# Patient Record
Sex: Female | Born: 1935 | Race: Black or African American | Hispanic: No | Marital: Married | Smoking: Never smoker
Health system: Southern US, Community
[De-identification: ages and names within clinical notes are randomized; demographics above are authoritative.]

## PROBLEM LIST (undated history)

## (undated) DIAGNOSIS — M6282 Rhabdomyolysis: Secondary | ICD-10-CM

## (undated) DIAGNOSIS — M1990 Unspecified osteoarthritis, unspecified site: Secondary | ICD-10-CM

## (undated) DIAGNOSIS — I639 Cerebral infarction, unspecified: Secondary | ICD-10-CM

## (undated) DIAGNOSIS — E538 Deficiency of other specified B group vitamins: Secondary | ICD-10-CM

## (undated) DIAGNOSIS — E785 Hyperlipidemia, unspecified: Secondary | ICD-10-CM

## (undated) DIAGNOSIS — E119 Type 2 diabetes mellitus without complications: Secondary | ICD-10-CM

## (undated) DIAGNOSIS — I10 Essential (primary) hypertension: Secondary | ICD-10-CM

## (undated) DIAGNOSIS — E041 Nontoxic single thyroid nodule: Secondary | ICD-10-CM

## (undated) HISTORY — DX: Morbid (severe) obesity due to excess calories: E66.01

## (undated) HISTORY — DX: Type 2 diabetes mellitus without complications: E11.9

## (undated) HISTORY — DX: Hyperlipidemia, unspecified: E78.5

## (undated) HISTORY — DX: Essential (primary) hypertension: I10

## (undated) HISTORY — DX: Unspecified osteoarthritis, unspecified site: M19.90

---

## 2000-07-17 ENCOUNTER — Encounter: Admission: RE | Admit: 2000-07-17 | Discharge: 2000-10-15 | Payer: Self-pay | Admitting: Endocrinology

## 2003-09-15 ENCOUNTER — Emergency Department (HOSPITAL_COMMUNITY): Admission: EM | Admit: 2003-09-15 | Discharge: 2003-09-15 | Payer: Self-pay | Admitting: Emergency Medicine

## 2004-10-17 ENCOUNTER — Ambulatory Visit: Payer: Self-pay | Admitting: Endocrinology

## 2004-10-23 ENCOUNTER — Ambulatory Visit: Payer: Self-pay | Admitting: Endocrinology

## 2004-12-05 ENCOUNTER — Ambulatory Visit: Payer: Self-pay | Admitting: Endocrinology

## 2005-03-06 ENCOUNTER — Ambulatory Visit: Payer: Self-pay | Admitting: Endocrinology

## 2005-06-05 ENCOUNTER — Ambulatory Visit: Payer: Self-pay | Admitting: Endocrinology

## 2005-07-10 ENCOUNTER — Ambulatory Visit: Payer: Self-pay | Admitting: Endocrinology

## 2006-01-03 ENCOUNTER — Ambulatory Visit: Payer: Self-pay | Admitting: Endocrinology

## 2006-07-02 ENCOUNTER — Ambulatory Visit: Payer: Self-pay | Admitting: Endocrinology

## 2006-09-25 ENCOUNTER — Ambulatory Visit: Payer: Self-pay | Admitting: Endocrinology

## 2006-09-25 LAB — CONVERTED CEMR LAB
Bilirubin, Direct: 0.1 mg/dL (ref 0.0–0.3)
HDL: 34.4 mg/dL — ABNORMAL LOW (ref >39.0)
Hgb A1c MFr Bld: 7 % — ABNORMAL HIGH (ref 4.6–6.0)
LDL Cholesterol: 120 mg/dL — ABNORMAL HIGH (ref 0–99)
Triglycerides: 123 mg/dL (ref 0–149)
VLDL: 25 mg/dL (ref 0–40)

## 2006-12-25 ENCOUNTER — Ambulatory Visit: Payer: Self-pay | Admitting: Endocrinology

## 2006-12-25 LAB — CONVERTED CEMR LAB: Hgb A1c MFr Bld: 7 % — ABNORMAL HIGH (ref 4.6–6.0)

## 2007-05-09 ENCOUNTER — Encounter: Payer: Self-pay | Admitting: *Deleted

## 2007-05-09 DIAGNOSIS — M1990 Unspecified osteoarthritis, unspecified site: Secondary | ICD-10-CM

## 2007-05-09 DIAGNOSIS — I10 Essential (primary) hypertension: Secondary | ICD-10-CM

## 2007-05-09 DIAGNOSIS — E785 Hyperlipidemia, unspecified: Secondary | ICD-10-CM

## 2007-05-09 DIAGNOSIS — L301 Dyshidrosis [pompholyx]: Secondary | ICD-10-CM | POA: Insufficient documentation

## 2007-05-09 HISTORY — DX: Hyperlipidemia, unspecified: E78.5

## 2007-05-09 HISTORY — DX: Unspecified osteoarthritis, unspecified site: M19.90

## 2007-05-09 HISTORY — DX: Essential (primary) hypertension: I10

## 2007-06-24 ENCOUNTER — Ambulatory Visit: Payer: Self-pay | Admitting: Endocrinology

## 2007-06-24 DIAGNOSIS — L299 Pruritus, unspecified: Secondary | ICD-10-CM | POA: Insufficient documentation

## 2007-06-24 LAB — CONVERTED CEMR LAB
CO2: 29 meq/L (ref 19–32)
Cholesterol: 249 mg/dL (ref 0–200)
Creatinine, Ser: 1 mg/dL (ref 0.4–1.2)
HDL: 33.1 mg/dL — ABNORMAL LOW (ref >39.0)
Potassium: 5 meq/L (ref 3.5–5.1)
Sodium: 139 meq/L (ref 135–145)

## 2007-12-03 ENCOUNTER — Encounter: Payer: Self-pay | Admitting: Endocrinology

## 2007-12-23 ENCOUNTER — Ambulatory Visit: Payer: Self-pay | Admitting: Endocrinology

## 2008-06-22 ENCOUNTER — Ambulatory Visit: Payer: Self-pay | Admitting: Endocrinology

## 2008-12-03 ENCOUNTER — Encounter: Payer: Self-pay | Admitting: Endocrinology

## 2008-12-21 ENCOUNTER — Ambulatory Visit: Payer: Self-pay | Admitting: Endocrinology

## 2008-12-23 LAB — CONVERTED CEMR LAB
ALT: 20 units/L (ref 0–35)
AST: 26 units/L (ref 0–37)
Bilirubin, Direct: 0.3 mg/dL (ref 0.0–0.3)
Hgb A1c MFr Bld: 7 % — ABNORMAL HIGH (ref 4.6–6.5)
Total Bilirubin: 0.9 mg/dL (ref 0.3–1.2)
Total CHOL/HDL Ratio: 3

## 2009-01-26 ENCOUNTER — Telehealth: Payer: Self-pay | Admitting: Endocrinology

## 2009-02-23 ENCOUNTER — Ambulatory Visit: Payer: Self-pay | Admitting: Endocrinology

## 2009-04-15 ENCOUNTER — Telehealth: Payer: Self-pay | Admitting: Endocrinology

## 2009-04-19 ENCOUNTER — Ambulatory Visit: Payer: Self-pay | Admitting: Endocrinology

## 2009-04-19 LAB — CONVERTED CEMR LAB
Creatinine,U: 100.4 mg/dL
Hgb A1c MFr Bld: 6.9 % — ABNORMAL HIGH (ref 4.6–6.5)

## 2009-06-24 ENCOUNTER — Ambulatory Visit: Payer: Self-pay | Admitting: Endocrinology

## 2009-06-24 DIAGNOSIS — E119 Type 2 diabetes mellitus without complications: Secondary | ICD-10-CM

## 2009-06-24 DIAGNOSIS — Z9119 Patient's noncompliance with other medical treatment and regimen: Secondary | ICD-10-CM

## 2009-06-24 HISTORY — DX: Type 2 diabetes mellitus without complications: E11.9

## 2009-12-21 ENCOUNTER — Encounter: Payer: Self-pay | Admitting: Endocrinology

## 2009-12-29 ENCOUNTER — Ambulatory Visit: Payer: Self-pay | Admitting: Endocrinology

## 2009-12-29 DIAGNOSIS — M25569 Pain in unspecified knee: Secondary | ICD-10-CM

## 2009-12-30 LAB — CONVERTED CEMR LAB
ALT: 18 units/L (ref 0–35)
Alkaline Phosphatase: 74 units/L (ref 39–117)
Basophils Relative: 0.5 % (ref 0.0–3.0)
Bilirubin, Direct: 0.1 mg/dL (ref 0.0–0.3)
Calcium: 9.5 mg/dL (ref 8.4–10.5)
Chloride: 104 meq/L (ref 96–112)
Creatinine, Ser: 0.9 mg/dL (ref 0.4–1.2)
Eosinophils Relative: 2.2 % (ref 0.0–5.0)
LDL Cholesterol: 87 mg/dL (ref 0–99)
Lymphocytes Relative: 27.4 % (ref 12.0–46.0)
MCV: 93.6 fL (ref 78.0–100.0)
Monocytes Relative: 13.9 % — ABNORMAL HIGH (ref 3.0–12.0)
Neutrophils Relative %: 56 % (ref 43.0–77.0)
Nitrite: NEGATIVE
RBC: 4.23 M/uL (ref 3.87–5.11)
Total CHOL/HDL Ratio: 3
Total Protein, Urine: NEGATIVE mg/dL
Total Protein: 7.4 g/dL (ref 6.0–8.3)
Triglycerides: 62 mg/dL (ref 0.0–149.0)
Uric Acid, Serum: 6.8 mg/dL (ref 2.4–7.0)
WBC: 6.4 10*3/uL (ref 4.5–10.5)
pH: 6 (ref 5.0–8.0)

## 2010-09-10 LAB — CONVERTED CEMR LAB
Basophils Absolute: 0 10*3/uL (ref 0.0–0.1)
Bilirubin, Direct: 0.1 mg/dL (ref 0.0–0.3)
Calcium: 9.3 mg/dL (ref 8.4–10.5)
Creatinine, Ser: 0.9 mg/dL (ref 0.4–1.2)
Creatinine,U: 103.7 mg/dL
Crystals: NEGATIVE
Direct LDL: 180.9 mg/dL
GFR calc Af Amer: 77 mL/min
GFR calc non Af Amer: 64 mL/min
HCT: 39 % (ref 36.0–46.0)
HDL: 34.8 mg/dL — ABNORMAL LOW (ref >39.0)
LDL Cholesterol: 199 mg/dL — ABNORMAL HIGH (ref 0–99)
Lymphocytes Relative: 33.4 % (ref 12.0–46.0)
MCHC: 34.9 g/dL (ref 30.0–36.0)
Microalb Creat Ratio: 7.7 mg/g (ref 0.0–30.0)
Microalb, Ur: 0.8 mg/dL (ref 0.0–1.9)
Monocytes Absolute: 1.1 10*3/uL — ABNORMAL HIGH (ref 0.1–1.0)
Monocytes Relative: 18.3 % — ABNORMAL HIGH (ref 3.0–12.0)
Nitrite: NEGATIVE
Platelets: 209 10*3/uL (ref 150–400)
RDW: 13.6 % (ref 11.5–14.6)
Sodium: 141 meq/L (ref 135–145)
TSH: 1.47 microintl units/mL (ref 0.35–5.50)
Total Bilirubin: 0.9 mg/dL (ref 0.3–1.2)
Total CHOL/HDL Ratio: 7.4
Total Protein, Urine: NEGATIVE mg/dL
Triglycerides: 125 mg/dL (ref 0–149)
Urobilinogen, UA: 0.2 (ref 0.0–1.0)

## 2010-09-14 NOTE — Assessment & Plan Note (Signed)
Summary: f/u appt/#/cd   Vital Signs:  Patient profile:   77 year old female Height:      65 inches (165.10 cm) Weight:      218 pounds (99.09 kg) BMI:     36.41 O2 Sat:      94 % on Room air Temp:     98.3 degrees F (36.83 degrees C) oral Pulse rate:   105 / minute BP sitting:   112 / 70  (left arm) Cuff size:   large  Vitals Entered By: Josph Macho RMA (Dec 29, 2009 10:28 AM)  O2 Flow:  Room air CC: Follow-up visit/ CF Is Patient Diabetic? Yes   CC:  Follow-up visit/ CF.  History of Present Illness: the status of at least 3 ongoing medical problems is addressed today: pt says she does not want the azor.  she wants to take the lotrel.   pt states she feels well in general, except for her chronic right knee pain. pt says she does not check cbg's.  she tolerates the actos well.  Current Medications (verified): 1)  Celebrex 200 Mg  Caps (Celecoxib) .... Take 1 By Mouth Qd 2)  Glucophage 1000 Mg  Tabs (Metformin Hcl) .... Take 2 By Mouth Once Daily 3)  Actos 45 Mg  Tabs (Pioglitazone Hcl) .... Take 1 By Mouth Qd 4)  Lotrel 5-20 Mg  Caps (Amlodipine Besy-Benazepril Hcl) .... Take 2 By Mouth Qd 5)  Hydrochlorothiazide 25 Mg  Tabs (Hydrochlorothiazide) .... Take 1 By Mouth Qd 6)  Lipitor 80 Mg Tabs (Atorvastatin Calcium) .... Take 1 By Mouth Once Daily 7)  Triamcinolone Acetonide 0.025 %  Crea (Triamcinolone Acetonide) .... Apply To Affected Area Prn 8)  Coreg 3.125 Mg  Tabs (Carvedilol) .... Twice A Day  Allergies (verified): 1)  ! Lipitor  Past History:  Past Medical History: Last updated: 05/09/2007 Diabetes mellitus, type I Hyperlipidemia Hypertension Osteoarthritis Morbid Obesity Mammogram (Pt Refused to have) GYN Exam ( Pt Refused to have)  Review of Systems  The patient denies prolonged cough and peripheral edema.    Physical Exam  General:  morbidly obese.   Msk:  gait is normal and steady Extremities:  right knee is normal to my eaxm Additional  Exam:  Hemoglobin A1C       [H]  6.7 %    Impression & Recommendations:  Problem # 1:  DIABETES MELLITUS, TYPE II (ICD-250.00) well-controlled  Problem # 2:  HYPERTENSION (ICD-401.9) well-controlled  Problem # 3:  KNEE PAIN, RIGHT (ICD-719.46) uncertain etiology  Other Orders: TLB-Lipid Panel (80061-LIPID) TLB-BMP (Basic Metabolic Panel-BMET) (80048-METABOL) TLB-CBC Platelet - w/Differential (85025-CBCD) TLB-Hepatic/Liver Function Pnl (80076-HEPATIC) TLB-TSH (Thyroid Stimulating Hormone) (84443-TSH) TLB-A1C / Hgb A1C (Glycohemoglobin) (83036-A1C) TLB-Microalbumin/Creat Ratio, Urine (82043-MALB) TLB-Udip w/ Micro (81001-URINE) TLB-Uric Acid, Blood (84550-URIC) TLB-Sedimentation Rate (ESR) (85652-ESR) Est. Patient Level IV ([address])  Patient Instructions: 1)  please schedule a physical in 1 month.  you can do the blood tests for it today. 2)  pending the test results, please continue the same medications for now. 3)  (update: i left message on phone-tree:  rx as we discussed) Prescriptions: LOTREL 5-20 MG  CAPS (AMLODIPINE BESY-BENAZEPRIL HCL) take 2 by mouth qd  #90 x 3   Entered and Authorized by:   Minus Breeding MD   Signed by:   Minus Breeding MD on 12/29/2009   Method used:   [facility]        [facility] [address] [phone]* (retail)  [address]       [place], [place]  [address]       Ph: [phone] or [phone]       Fax: [phone]   RxID:   [id number] TRIAMCINOLONE ACETONIDE 0.025 %  CREA (TRIAMCINOLONE ACETONIDE) APPLY TO AFFECTED AREA PRN  #80 Gram x 1   Entered and Authorized by:   Minus Breeding MD   Signed by:   Minus Breeding MD on 12/29/2009   Method used:   [facility]        [facility] [address] [phone]* (retail)       [address]       [place], [place]  [address]       Ph: [phone] or [phone]       Fax: [phone]   RxID:   [id number] COREG 3.125 MG  TABS  (CARVEDILOL) twice a day  #180 x 3   Entered and Authorized by:   Minus Breeding MD   Signed by:   Minus Breeding MD on 12/29/2009   Method used:   [facility]        [facility] [address] [phone]* (retail)       [address]       [place], [place]  [address]       Ph: [phone] or [phone]       Fax: [phone]   RxID:   [id number] CELEBREX 200 MG  CAPS (CELECOXIB) take 1 by mouth qd  #90 x 3   Entered and Authorized by:   Minus Breeding MD   Signed by:   Minus Breeding MD on 12/29/2009   Method used:   [facility] to        [facility] [address] [phone]* (retail)       [address]       [place], [place]  [address]       Ph: [phone] or [phone]       Fax: [phone]   RxID:   [id number] LIPITOR 80 MG TABS (ATORVASTATIN CALCIUM) take 1 by mouth once daily  #90 x 3   Entered and Authorized by:   Minus Breeding MD   Signed by:   Minus Breeding MD on 12/29/2009   Method used:   [facility] to        [facility] [address] [phone]* (retail)       [address]       [place], [place]  [address]       Ph: [phone] or [phone]       Fax: [phone]   RxID:   [id number] HYDROCHLOROTHIAZIDE 25 MG  TABS (HYDROCHLOROTHIAZIDE) take 1 by mouth qd  #90 x 3   Entered and Authorized by:   Minus Breeding MD   Signed by:   Minus Breeding MD on 12/29/2009   Method used:   [facility] to        [facility] [address] [phone]* (retail)       [address]       [place], [place]  [address]       Ph: [phone] or [phone]  Fax: [phone]   RxID:   [id number] ACTOS 45 MG  TABS (PIOGLITAZONE HCL) take 1 by mouth qd  #90 x 3   Entered and Authorized by:   Minus Breeding MD   Signed by:   Minus Breeding MD on 12/29/2009   Method used:   [facility]        [facility] [address] [phone]* (retail)       [address]        [place], [place]  [address]       Ph: [phone] or [phone]       Fax: [phone]   RxID:   [id number] GLUCOPHAGE 1000 MG  TABS (METFORMIN HCL) take 2 by mouth once daily  #180 x 3   Entered and Authorized by:   Minus Breeding MD   Signed by:   Minus Breeding MD on 12/29/2009   Method used:   [facility]ronically to        [facility] [address] [phone]* (retail)       [address]       [place], [place]  [address]       Ph: [phone] or [phone]       Fax: [phone]   RxID:   [id number]

## 2010-09-14 NOTE — Letter (Signed)
Summary: [facility]  [facility]   Imported By: Sherian Rein 12/27/2009 08:07:53  _____________________________________________________________________  External Attachment:    Type:   Image     Comment:   External Document

## 2010-12-21 ENCOUNTER — Encounter: Payer: Self-pay | Admitting: Endocrinology

## 2010-12-21 ENCOUNTER — Other Ambulatory Visit (INDEPENDENT_AMBULATORY_CARE_PROVIDER_SITE_OTHER): Payer: Medicare Other

## 2010-12-21 ENCOUNTER — Ambulatory Visit (INDEPENDENT_AMBULATORY_CARE_PROVIDER_SITE_OTHER): Payer: Medicare Other | Admitting: Endocrinology

## 2010-12-21 DIAGNOSIS — E785 Hyperlipidemia, unspecified: Secondary | ICD-10-CM

## 2010-12-21 DIAGNOSIS — I10 Essential (primary) hypertension: Secondary | ICD-10-CM

## 2010-12-21 DIAGNOSIS — Z0000 Encounter for general adult medical examination without abnormal findings: Secondary | ICD-10-CM

## 2010-12-21 DIAGNOSIS — Z79899 Other long term (current) drug therapy: Secondary | ICD-10-CM

## 2010-12-21 DIAGNOSIS — E119 Type 2 diabetes mellitus without complications: Secondary | ICD-10-CM

## 2010-12-21 LAB — URINALYSIS, ROUTINE W REFLEX MICROSCOPIC
Nitrite: NEGATIVE
Specific Gravity, Urine: 1.02 (ref 1.000–1.030)
Total Protein, Urine: NEGATIVE
Urine Glucose: NEGATIVE

## 2010-12-21 LAB — HEPATIC FUNCTION PANEL
ALT: 24 U/L (ref 0–35)
Albumin: 3.7 g/dL (ref 3.5–5.2)
Alkaline Phosphatase: 79 U/L (ref 39–117)
Total Protein: 7.1 g/dL (ref 6.0–8.3)

## 2010-12-21 LAB — CBC WITH DIFFERENTIAL/PLATELET
Basophils Absolute: 0.1 10*3/uL (ref 0.0–0.1)
Eosinophils Absolute: 0.2 10*3/uL (ref 0.0–0.7)
Hemoglobin: 14.6 g/dL (ref 12.0–15.0)
Lymphocytes Relative: 27.9 % (ref 12.0–46.0)
MCHC: 33.7 g/dL (ref 30.0–36.0)
Monocytes Relative: 10.1 % (ref 3.0–12.0)
Neutro Abs: 5.5 10*3/uL (ref 1.4–7.7)
Neutrophils Relative %: 58.2 % (ref 43.0–77.0)
Platelets: 268 10*3/uL (ref 150.0–400.0)
RDW: 15.5 % — ABNORMAL HIGH (ref 11.5–14.6)

## 2010-12-21 LAB — BASIC METABOLIC PANEL
CO2: 31 mEq/L (ref 19–32)
Calcium: 9.6 mg/dL (ref 8.4–10.5)
Chloride: 107 mEq/L (ref 96–112)
Glucose, Bld: 169 mg/dL — ABNORMAL HIGH (ref 70–99)
Sodium: 143 mEq/L (ref 135–145)

## 2010-12-21 LAB — LIPID PANEL
Cholesterol: 167 mg/dL (ref 0–200)
HDL: 45.2 mg/dL (ref >39.00)
Triglycerides: 112 mg/dL (ref 0.0–149.0)
VLDL: 22.4 mg/dL (ref 0.0–40.0)

## 2010-12-21 MED ORDER — GLIMEPIRIDE 1 MG PO TABS: 1.0000 mg | ORAL_TABLET | Freq: Every day | ORAL | Status: DC

## 2010-12-21 NOTE — Progress Notes (Signed)
Subjective:    Patient ID: Shelby Jennings, female    DOB: 15-Mar-1924, 77 y.o.   MRN: [id number]  HPI The state of at least three ongoing medical problems is addressed today: Htn: pt says she takes bp meds as rx'ed.  Denies sob. Dm:  no cbg record, but states cbg's are well-controlled.  She reports fatigue. Dyslipidemia:  Denies chest pain. Past Medical History  Diagnosis Date  . DIABETES MELLITUS, TYPE II 06/24/2009  . HYPERLIPIDEMIA 05/09/2007  . HYPERTENSION 05/09/2007  . OSTEOARTHRITIS 05/09/2007  . Morbid obesity     No past surgical history on file.  History   Social History  . Marital Status: Married    Spouse Name: N/A    Number of Children: N/A  . Years of Education: N/A   Occupational History  . Retired    Social History Main Topics  . Smoking status: Never Smoker   . Smokeless tobacco: Not on file  . Alcohol Use: Not on file  . Drug Use: Not on file  . Sexually Active: Not on file   Other Topics Concern  . Not on file   Social History Narrative   Widowed    Current Outpatient Prescriptions on File Prior to Visit  Medication Sig Dispense Refill  . atorvastatin (LIPITOR) 80 MG tablet Take 80 mg by mouth daily.        . carvedilol (COREG) 3.125 MG tablet Take 3.125 mg by mouth 2 (two) times daily with a meal.        . celecoxib (CELEBREX) 200 MG capsule Take 200 mg by mouth daily.        . hydrochlorothiazide 25 MG tablet Take 25 mg by mouth daily.        . metFORMIN (GLUCOPHAGE) 1000 MG tablet Take 2 by mouth once daily       . amLODipine-benazepril (LOTREL) 5-20 MG per capsule Take 2 capsules by mouth daily.        . pioglitazone (ACTOS) 45 MG tablet Take 45 mg by mouth daily.          Allergies  Allergen Reactions  . Atorvastatin     REACTION: Myalgias    Family History  Problem Relation Age of Onset  . Cancer Mother     had uncertain type of cancer    BP 148/86  Pulse 82  Temp(Src) 97.4 F (36.3 C) (Oral)  Ht 5\' 5"  (1.651 m)  Wt 199 lb  12.8 oz (90.629 kg)  BMI 33.25 kg/m2  SpO2 95%    Review of Systems She has lost weight, due to her efforts.  Denies numbness of the feet.    Objective:   Physical Exam GENERAL: no distress.  Obese. Pulses: dorsalis pedis intact bilat.   Feet: no deformity.  no ulcer on the feet.  feet are of normal color and temp.  no edema Neuro: sensation is intact to touch on the feet.      Lab Results  Component Value Date   WBC 9.5 12/21/2010   HGB 14.6 12/21/2010   HCT 43.4 12/21/2010   PLT 268.0 12/21/2010   CHOL 167 12/21/2010   TRIG 112.0 12/21/2010   HDL 45.20 12/21/2010   LDLDIRECT 180.9 06/22/2008   ALT 24 12/21/2010   AST 20 12/21/2010   NA 143 12/21/2010   K 4.3 12/21/2010   CL 107 12/21/2010   CREATININE 0.9 12/21/2010   BUN 21 12/21/2010   CO2 31 12/21/2010   TSH 1.57 12/21/2010   HGBA1C  8.4* 12/21/2010   MICROALBUR 5.3* 12/21/2010     Assessment & Plan:  Dm, needs increased rx Dyslipidemia.  Lipids and direct ldl measurements are contradictory Htn, with ?of situational component.

## 2010-12-21 NOTE — Patient Instructions (Addendum)
good diet and exercise habits significanly improve the control of your diabetes.  please let me know if you wish to be referred to a dietician.  high blood sugar is very risky to your health.  you should see an eye doctor every year. controlling your blood pressure and cholesterol drastically reduces the damage diabetes does to your body.  this also applies to quitting smoking.  please discuss these with your doctor.  you should take an aspirin every day, unless you have been advised by a doctor not to. check your blood sugar 1 time a day.  vary the time of day when you check, between before the 3 meals, and at bedtime.  also check if you have symptoms of your blood sugar being too high or too low.  please keep a record of the readings and bring it to your next appointment here.  please call us sooner if you are having low blood sugar episodes. blood tests are being ordered for you today.  please call [phone] to hear your test results.  You will be prompted to enter the 9-digit "MRN" number that appears at the top left of this page, followed by #.  Then you will hear the message. pending the test results, please continue the same medications for now. Please make a regular physical appointment in 3 months. (update: i left message on phone-tree:  Add amaryl 1 mg qam).

## 2010-12-22 ENCOUNTER — Other Ambulatory Visit: Payer: Self-pay | Admitting: Endocrinology

## 2010-12-22 NOTE — Progress Notes (Signed)
Rx called in to pharmacy. 

## 2010-12-28 ENCOUNTER — Ambulatory Visit: Payer: Self-pay | Admitting: Endocrinology

## 2011-01-22 ENCOUNTER — Other Ambulatory Visit: Payer: Self-pay | Admitting: Endocrinology

## 2011-03-22 ENCOUNTER — Other Ambulatory Visit: Payer: Self-pay | Admitting: *Deleted

## 2011-03-22 MED ORDER — METFORMIN HCL 1000 MG PO TABS: ORAL_TABLET | ORAL | Status: DC

## 2011-03-22 NOTE — Telephone Encounter (Signed)
R'cd [facility] for refill of Metformin  Last OV-12/21/2010

## 2011-03-23 ENCOUNTER — Other Ambulatory Visit (INDEPENDENT_AMBULATORY_CARE_PROVIDER_SITE_OTHER): Payer: Medicare Other

## 2011-03-23 ENCOUNTER — Ambulatory Visit (INDEPENDENT_AMBULATORY_CARE_PROVIDER_SITE_OTHER): Payer: Medicare Other | Admitting: Endocrinology

## 2011-03-23 ENCOUNTER — Encounter: Payer: Self-pay | Admitting: Endocrinology

## 2011-03-23 DIAGNOSIS — E119 Type 2 diabetes mellitus without complications: Secondary | ICD-10-CM

## 2011-03-23 DIAGNOSIS — E785 Hyperlipidemia, unspecified: Secondary | ICD-10-CM

## 2011-03-23 DIAGNOSIS — Z79899 Other long term (current) drug therapy: Secondary | ICD-10-CM

## 2011-03-23 DIAGNOSIS — E079 Disorder of thyroid, unspecified: Secondary | ICD-10-CM | POA: Insufficient documentation

## 2011-03-23 DIAGNOSIS — I10 Essential (primary) hypertension: Secondary | ICD-10-CM

## 2011-03-23 LAB — CBC WITH DIFFERENTIAL/PLATELET
Basophils Absolute: 0 10*3/uL (ref 0.0–0.1)
Eosinophils Relative: 1.3 % (ref 0.0–5.0)
HCT: 43.7 % (ref 36.0–46.0)
Lymphs Abs: 2.4 10*3/uL (ref 0.7–4.0)
MCV: 93.5 fl (ref 78.0–100.0)
Monocytes Absolute: 0.8 10*3/uL (ref 0.1–1.0)
Monocytes Relative: 10.3 % (ref 3.0–12.0)
Neutrophils Relative %: 57.9 % (ref 43.0–77.0)
Platelets: 235 10*3/uL (ref 150.0–400.0)
RDW: 15.1 % — ABNORMAL HIGH (ref 11.5–14.6)
WBC: 8 10*3/uL (ref 4.5–10.5)

## 2011-03-23 LAB — HEPATIC FUNCTION PANEL
Albumin: 3.8 g/dL (ref 3.5–5.2)
Bilirubin, Direct: 0.1 mg/dL (ref 0.0–0.3)
Total Protein: 7.3 g/dL (ref 6.0–8.3)

## 2011-03-23 LAB — MICROALBUMIN / CREATININE URINE RATIO
Creatinine,U: 111.6 mg/dL
Microalb Creat Ratio: 0.5 mg/g (ref 0.0–30.0)
Microalb, Ur: 0.6 mg/dL (ref 0.0–1.9)

## 2011-03-23 LAB — HEMOGLOBIN A1C: Hgb A1c MFr Bld: 7.5 % — ABNORMAL HIGH (ref 4.6–6.5)

## 2011-03-23 LAB — BASIC METABOLIC PANEL
Calcium: 9.5 mg/dL (ref 8.4–10.5)
Creatinine, Ser: 0.9 mg/dL (ref 0.4–1.2)
GFR: 78.46 mL/min (ref >60.00)

## 2011-03-23 LAB — LIPID PANEL
HDL: 36.5 mg/dL — ABNORMAL LOW (ref >39.00)
Triglycerides: 91 mg/dL (ref 0.0–149.0)
VLDL: 18.2 mg/dL (ref 0.0–40.0)

## 2011-03-23 LAB — URINALYSIS, ROUTINE W REFLEX MICROSCOPIC
Bilirubin Urine: NEGATIVE
Ketones, ur: NEGATIVE
Total Protein, Urine: NEGATIVE
pH: 6 (ref 5.0–8.0)

## 2011-03-23 LAB — TSH: TSH: 1.09 u[IU]/mL (ref 0.35–5.50)

## 2011-03-23 NOTE — Progress Notes (Signed)
Subjective:    Patient ID: Shelby Jennings, female    DOB: [birth year], 77 y.o.   MRN: [id number]  HPI here for regular wellness examination.  she's feeling pretty well in general, and says chronic med probs are stable, except as noted below Past Medical History  Diagnosis Date  . DIABETES MELLITUS, TYPE II 06/24/2009  . HYPERLIPIDEMIA 05/09/2007  . HYPERTENSION 05/09/2007  . OSTEOARTHRITIS 05/09/2007  . Morbid obesity     No past surgical history on file.  History   Social History  . Marital Status: Married    Spouse Name: N/A    Number of Children: N/A  . Years of Education: N/A   Occupational History  . Retired    Social History Main Topics  . Smoking status: Never Smoker   . Smokeless tobacco: Not on file  . Alcohol Use: No  . Drug Use: No  . Sexually Active: Not on file   Other Topics Concern  . Not on file   Social History Narrative   Widowed    Current Outpatient Prescriptions on File Prior to Visit  Medication Sig Dispense Refill  . ACTOS 45 MG tablet TAKE 1 TABLET BY MOUTH EVERY DAY  90 tablet  1  . amLODipine-benazepril (LOTREL) 5-20 MG per capsule Take 2 capsules by mouth daily.        . carvedilol (COREG) 3.125 MG tablet Take 3.125 mg by mouth 2 (two) times daily with a meal.        . CELEBREX 200 MG capsule TAKE 1 TABLET BY MOUTH EVERY DAY  90 capsule  1  . glimepiride (AMARYL) 1 MG tablet Take 1 tablet (1 mg total) by mouth daily before breakfast.  30 tablet  11  . hydrochlorothiazide 25 MG tablet TAKE 1 TABLET BY MOUTH EVERY DAY  90 tablet  1  . LIPITOR 80 MG tablet TAKE 1 BY MOUTH ONCE DAILY  90 tablet  1  . metFORMIN (GLUCOPHAGE) 1000 MG tablet Take 2 tablets by mouth once daily  180 tablet  1  . triamcinolone (KENALOG) 0.025 % cream APPLY TO AFFECTED AREAS AS NEEDED  80 g  3    Allergies  Allergen Reactions  . Atorvastatin     REACTION: Myalgias    Family History  Problem Relation Age of Onset  . Cancer Mother     had uncertain type of cancer     BP 134/82  Pulse 76  Temp(Src) 98.1 F (36.7 C) (Oral)  Ht 5\' 5"  (1.651 m)  Wt 194 lb 12.8 oz (88.361 kg)  BMI 32.42 kg/m2  SpO2 96%    Review of Systems  Constitutional: Negative for fever.       She has lost a few lbs, due to her efforts  HENT: Negative for hearing loss.   Eyes: Negative for visual disturbance.  Respiratory: Negative for shortness of breath.   Cardiovascular: Negative for chest pain.  Gastrointestinal: Negative for anal bleeding.  Genitourinary: Negative for hematuria.  Musculoskeletal:       Chronic pain at the right knee  Skin: Negative for rash.  Neurological: Negative for headaches.  Hematological: Does not bruise/bleed easily.  Psychiatric/Behavioral: Negative for dysphoric mood.      Objective:   Physical Exam VS: see vs page GEN: no distress HEAD: head: no deformity eyes: no periorbital swelling, no proptosis external nose and ears are normal mouth: no lesion seen. CHEST WALL: no deformity BREASTS:  declined CV: reg rate and rhythm, no murmur ABD: abdomen  is soft, nontender.  no hepatosplenomegaly.  not distended.  no hernia GENITALIA:  declined RECTAL: declined. MUSCULOSKELETAL: muscle bulk and strength are grossly normal.  no obvious joint swelling.  gait is normal and steady EXTEMITIES: no deformity.  no ulcer on the feet.  feet are of normal color and temp.  no edema PULSES: dorsalis pedis intact bilat.  no carotid bruit NEURO:  cn 2-12 grossly intact.   readily moves all 4's.  SKIN:  Normal texture and temperature.  No rash or suspicious lesion is visible.   NODES:  None palpable at the neck PSYCH: alert, oriented x3.  Does not appear anxious nor depressed.    Assessment & Plan:  Wellness visit today, with problems stable, except as noted.   SEPARATE EVALUATION FOLLOWS--EACH PROBLEM HERE IS NEW, NOT RESPONDING TO TREATMENT, OR POSES SIGNIFICANT RISK TO THE PATIENT'S HEALTH: HISTORY OF THE PRESENT ILLNESS: The state of at  least three ongoing medical problems is addressed today: no cbg record, but states cbg's are well-controlled.  Denies hypoglycemia sxs Left thyroid nodule is incidentally noted on physical exam today.  She says she does not notice the nodule. Pt says the celebrex is expensive, and provides incomplete relief for her arthralgias. PAST MEDICAL HISTORY reviewed and up to date today REVIEW OF SYSTEMS: Denies weight change and loc PHYSICAL EXAMINATION: GENERAL: no distress Neck: 3 cm left thyroid nodule Neuro: sensation is intact to touch on the feet LAB/XRAY RESULTS: Lab Results  Component Value Date   TSH 1.09 03/23/2011   Lab Results  Component Value Date   HGBA1C 7.5* 03/23/2011  IMPRESSION: Incidentally noted thyroid nodule.  She is at risk for thyroid cancer OA.  Pain needs increased rx. Dm.  this is the best control this pt should aim for, given this regimen, which does match insulin to her changing needs throughout the day PLAN: See instruction page

## 2011-03-23 NOTE — Patient Instructions (Addendum)
please consider these measures for your health:  minimize alcohol.  do not use tobacco products.  have a colonoscopy at least every 10 years from age 75.  Women should have an annual mammogram from age 56.  keep firearms safely stored.  always use seat belts.  have working smoke alarms in your home.  see an eye doctor and dentist regularly.  never drive under the influence of alcohol or drugs (including prescription drugs).   please let me know what your wishes would be, if artificial life support measures should become necessary.  it is critically important to prevent falling down (keep floor areas well-lit, dry, and free of loose objects). blood tests are being ordered for you today.  please call [phone] to hear your test results.  You will be prompted to enter the 9-digit "MRN" number that appears at the top left of this page, followed by #.  Then you will hear the message. Please make a follow-up appointment in 3 months. Let me know if you decide to have the biopsy of the thyroid lump, as it could be cancer.   Let me know if you want to change celebrex to a stronger pain pill. (update: i left message on phone-tree:  rx as we discussed)

## 2011-04-24 ENCOUNTER — Other Ambulatory Visit: Payer: Self-pay | Admitting: Endocrinology

## 2011-06-26 ENCOUNTER — Ambulatory Visit: Payer: Medicare Other | Admitting: Endocrinology

## 2011-07-24 ENCOUNTER — Other Ambulatory Visit: Payer: Self-pay | Admitting: Endocrinology

## 2011-09-24 ENCOUNTER — Encounter: Payer: Self-pay | Admitting: Endocrinology

## 2011-09-24 ENCOUNTER — Ambulatory Visit (INDEPENDENT_AMBULATORY_CARE_PROVIDER_SITE_OTHER): Payer: Medicare Other | Admitting: Endocrinology

## 2011-09-24 ENCOUNTER — Other Ambulatory Visit (INDEPENDENT_AMBULATORY_CARE_PROVIDER_SITE_OTHER): Payer: Medicare Other

## 2011-09-24 VITALS — BP 130/68 | HR 64 | Temp 97.1°F | Ht 64.0 in | Wt 188.0 lb

## 2011-09-24 DIAGNOSIS — E119 Type 2 diabetes mellitus without complications: Secondary | ICD-10-CM

## 2011-09-24 LAB — HEMOGLOBIN A1C: Hgb A1c MFr Bld: 8.2 % — ABNORMAL HIGH (ref 4.6–6.5)

## 2011-09-24 MED ORDER — GLIMEPIRIDE 2 MG PO TABS: 2.0000 mg | ORAL_TABLET | Freq: Every day | ORAL | Status: DC

## 2011-09-24 MED ORDER — HYDROCHLOROTHIAZIDE 25 MG PO TABS: 25.0000 mg | ORAL_TABLET | Freq: Every day | ORAL | Status: DC

## 2011-09-24 MED ORDER — ROSUVASTATIN CALCIUM 10 MG PO TABS: 10.0000 mg | ORAL_TABLET | Freq: Every day | ORAL | Status: DC

## 2011-09-24 NOTE — Progress Notes (Signed)
  Subjective:    Patient ID: Shelby Jennings, female    DOB: 23-Aug-1923, 78 y.o.   MRN: [id number]  HPI The state of at least three ongoing medical problems is addressed today: Pt returns for f/u of type 2 DM (1990).  pt states she feels well in general.  She does not check cbg's at home and does not wish to.  She does not wish to add additional med. Dyslipidemia: Pt says she tolerates lipitor well, but she only wants a brand-name med, and branded lipitor is now very expensive. HTN: Denies chest pain Past Medical History  Diagnosis Date  . DIABETES MELLITUS, TYPE II 06/24/2009  . HYPERLIPIDEMIA 05/09/2007  . HYPERTENSION 05/09/2007  . OSTEOARTHRITIS 05/09/2007  . Morbid obesity     No past surgical history on file.  History   Social History  . Marital Status: Married    Spouse Name: N/A    Number of Children: N/A  . Years of Education: N/A   Occupational History  . Retired    Social History Main Topics  . Smoking status: Never Smoker   . Smokeless tobacco: Not on file  . Alcohol Use: No  . Drug Use: No  . Sexually Active: Not on file   Other Topics Concern  . Not on file   Social History Narrative   Widowed    Current Outpatient Prescriptions on File Prior to Visit  Medication Sig Dispense Refill  . amLODipine-benazepril (LOTREL) 5-20 MG per capsule Take 2 capsules by mouth daily.        . carvedilol (COREG) 3.125 MG tablet TAKE 1 TABLET BY MOUTH TWICE A DAY  180 tablet  3  . CELEBREX 200 MG capsule TAKE 1 TABLET BY MOUTH EVERY DAY  90 capsule  1  . metFORMIN (GLUCOPHAGE) 1000 MG tablet Take 2 tablets by mouth once daily  180 tablet  1  . triamcinolone (KENALOG) 0.025 % cream APPLY TO AFFECTED AREAS AS NEEDED  80 g  3  . ACTOS 45 MG tablet TAKE 1 TABLET BY MOUTH EVERY DAY  90 tablet  1    Allergies  Allergen Reactions  . Atorvastatin     REACTION: Myalgias    Family History  Problem Relation Age of Onset  . Cancer Mother     had uncertain type of cancer     BP 130/68  Pulse 64  Temp(Src) 97.1 F (36.2 C) (Oral)  Ht 5\' 4"  (1.626 m)  Wt 188 lb (85.276 kg)  BMI 32.27 kg/m2  SpO2 94%   Review of Systems denies hypoglycemia and sob    Objective:   Physical Exam VITAL SIGNS:  See vs page GENERAL: no distress Pulses: dorsalis pedis intact bilat.   Feet: no deformity, except for overlapping toenails and corns.  no ulcer on the feet.  feet are of normal color and temp.  no edema Neuro: sensation is intact to touch on the feet.     Lab Results  Component Value Date   HGBA1C 8.2* 09/24/2011      Assessment & Plan:  Dyslipidemia.  Pt wants cheaper brand-name med DM.  therapy limited by noncompliance with cbg monitoring.  i'll do the best i can. HTN.  well-controlled

## 2011-09-24 NOTE — Patient Instructions (Addendum)
blood tests are being requested for you today.  please call [phone] to hear your test results.  You will be prompted to enter the 9-digit "MRN" number that appears at the top left of this page, followed by #.  Then you will hear the message. Change lipitor to crestor 10 mg daily.  i have sent a prescription to your pharmacy. Please come back for a regular physical appointment in 6 months. Please continue the same blood pressure medication.   (update: i left message on phone-tree:  rx as we discussed)

## 2011-09-25 ENCOUNTER — Ambulatory Visit: Payer: Medicare Other | Admitting: Endocrinology

## 2011-10-20 ENCOUNTER — Other Ambulatory Visit: Payer: Self-pay | Admitting: Endocrinology

## 2011-11-22 ENCOUNTER — Other Ambulatory Visit: Payer: Self-pay | Admitting: Endocrinology

## 2012-01-15 LAB — HM DIABETES EYE EXAM

## 2012-04-22 ENCOUNTER — Other Ambulatory Visit: Payer: Self-pay | Admitting: Endocrinology

## 2012-05-23 ENCOUNTER — Other Ambulatory Visit: Payer: Self-pay | Admitting: Endocrinology

## 2012-05-30 ENCOUNTER — Ambulatory Visit (INDEPENDENT_AMBULATORY_CARE_PROVIDER_SITE_OTHER): Payer: Medicare Other | Admitting: Endocrinology

## 2012-05-30 ENCOUNTER — Encounter: Payer: Self-pay | Admitting: Endocrinology

## 2012-05-30 VITALS — BP 146/94 | HR 90 | Temp 98.2°F | Wt 178.0 lb

## 2012-05-30 DIAGNOSIS — M1990 Unspecified osteoarthritis, unspecified site: Secondary | ICD-10-CM

## 2012-05-30 DIAGNOSIS — Z79899 Other long term (current) drug therapy: Secondary | ICD-10-CM

## 2012-05-30 DIAGNOSIS — E785 Hyperlipidemia, unspecified: Secondary | ICD-10-CM

## 2012-05-30 DIAGNOSIS — E079 Disorder of thyroid, unspecified: Secondary | ICD-10-CM

## 2012-05-30 DIAGNOSIS — E119 Type 2 diabetes mellitus without complications: Secondary | ICD-10-CM

## 2012-05-30 DIAGNOSIS — I10 Essential (primary) hypertension: Secondary | ICD-10-CM

## 2012-05-30 DIAGNOSIS — Z0000 Encounter for general adult medical examination without abnormal findings: Secondary | ICD-10-CM

## 2012-05-30 LAB — CBC WITH DIFFERENTIAL/PLATELET
Basophils Absolute: 0 10*3/uL (ref 0.0–0.1)
Basophils Relative: 0 % (ref 0–1)
MCHC: 33.3 g/dL (ref 30.0–36.0)
Monocytes Absolute: 0.7 10*3/uL (ref 0.1–1.0)
Neutro Abs: 4.3 10*3/uL (ref 1.7–7.7)
Neutrophils Relative %: 53 % (ref 43–77)
Platelets: 269 10*3/uL (ref 150–400)
RDW: 13.8 % (ref 11.5–15.5)

## 2012-05-30 LAB — HEPATIC FUNCTION PANEL
ALT: 28 U/L (ref 0–35)
AST: 25 U/L (ref 0–37)
Albumin: 4 g/dL (ref 3.5–5.2)
Alkaline Phosphatase: 69 U/L (ref 39–117)
Total Protein: 7.2 g/dL (ref 6.0–8.3)

## 2012-05-30 LAB — BASIC METABOLIC PANEL
CO2: 29 mEq/L (ref 19–32)
Calcium: 9.4 mg/dL (ref 8.4–10.5)
Sodium: 138 mEq/L (ref 135–145)

## 2012-05-30 LAB — TSH: TSH: 1.212 u[IU]/mL (ref 0.350–4.500)

## 2012-05-30 LAB — LIPID PANEL
Cholesterol: 147 mg/dL (ref 0–200)
VLDL: 19 mg/dL (ref 0–40)

## 2012-05-30 NOTE — Patient Instructions (Addendum)
blood tests are being requested for you today.  You will be contacted with results.   Please come back for a follow-up appointment in 6 months.    

## 2012-05-30 NOTE — Progress Notes (Signed)
Subjective:    Patient ID: Shelby Jennings, female    DOB: [birth year], 78 y.o.   MRN: [id number]  HPI The state of at least three ongoing medical problems is addressed today: Pt returns for f/u of type 2 DM (dx'ed 1990; no known complications; she does not check cbg's at home and refuses to; she refuses to add additional med, or to take insulin).  She tolestes meds well. Denies chest pain and sob Past Medical History  Diagnosis Date  . DIABETES MELLITUS, TYPE II 06/24/2009  . HYPERLIPIDEMIA 05/09/2007  . HYPERTENSION 05/09/2007  . OSTEOARTHRITIS 05/09/2007  . Morbid obesity     No past surgical history on file.  History   Social History  . Marital Status: Married    Spouse Name: N/A    Number of Children: N/A  . Years of Education: N/A   Occupational History  . Retired    Social History Main Topics  . Smoking status: Never Smoker   . Smokeless tobacco: Not on file  . Alcohol Use: No  . Drug Use: No  . Sexually Active: Not on file   Other Topics Concern  . Not on file   Social History Narrative   Widowed  no NOK, except for a brother she seldom sees  Current Outpatient Prescriptions on File Prior to Visit  Medication Sig Dispense Refill  . ACTOS 45 MG tablet TAKE 1 TABLET BY MOUTH EVERY DAY  90 tablet  1  . amLODipine-benazepril (LOTREL) 5-20 MG per capsule Take 2 capsules by mouth daily.        . carvedilol (COREG) 3.125 MG tablet TAKE 1 TABLET BY MOUTH TWICE A DAY  180 tablet  2  . CELEBREX 200 MG capsule TAKE 1 TABLET BY MOUTH EVERY DAY  90 capsule  2  . glimepiride (AMARYL) 2 MG tablet Take 1 tablet (2 mg total) by mouth daily before breakfast.  30 tablet  11  . hydrochlorothiazide (HYDRODIURIL) 25 MG tablet Take 1 tablet (25 mg total) by mouth daily.  30 tablet  11  . metFORMIN (GLUCOPHAGE) 1000 MG tablet TAKE 2 TABLETS BY MOUTH ONCE DAILY  180 tablet  1  . rosuvastatin (CRESTOR) 10 MG tablet Take 1 tablet (10 mg total) by mouth daily.  30 tablet  11  .  triamcinolone (KENALOG) 0.025 % cream APPLY TO AFFECTED AREAS AS NEEDED  80 g  1    Allergies  Allergen Reactions  . Atorvastatin     REACTION: Myalgias    Family History  Problem Relation Age of Onset  . Cancer Mother     had uncertain type of cancer    BP 146/94  Pulse 90  Temp 98.2 F (36.8 C) (Oral)  Wt 178 lb (80.74 kg)  SpO2 97%    Review of Systems  Constitutional:       She has lost a few lbs, due to her efforts  Gastrointestinal: Negative for anal bleeding.  Genitourinary: Negative for hematuria.  Neurological: Negative for numbness.  Psychiatric/Behavioral: Negative for dysphoric mood.       Objective:   Physical Exam Pulses: dorsalis pedis intact bilat.   Feet: no deformity.  no ulcer on the feet.  feet are of normal color and temp.  no edema Neuro: sensation is intact to touch on the feet    Lab Results  Component Value Date   WBC 8.1 05/30/2012   HGB 14.1 05/30/2012   HCT 42.3 05/30/2012   PLT 269 05/30/2012  GLUCOSE 92 05/30/2012   CHOL 147 05/30/2012   TRIG 97 05/30/2012   HDL 42 05/30/2012   LDLDIRECT 180.9 06/22/2008   LDLCALC 86 05/30/2012   ALT 28 05/30/2012   AST 25 05/30/2012   NA 138 05/30/2012   K 4.2 05/30/2012   CL 101 05/30/2012   CREATININE 0.86 05/30/2012   BUN 15 05/30/2012   CO2 29 05/30/2012   TSH 1.212 05/30/2012   HGBA1C 5.8* 05/30/2012   MICROALBUR 1.08 05/30/2012      Assessment & Plan:  DM is overcontrolled, given this sulfonylurea-containing regimen Dyslipidemia, well-controlled HTN, with ? Of situational component.

## 2012-05-31 LAB — URINALYSIS, ROUTINE W REFLEX MICROSCOPIC
Bilirubin Urine: NEGATIVE
Protein, ur: NEGATIVE mg/dL
Urobilinogen, UA: 1 mg/dL (ref 0.0–1.0)

## 2012-05-31 LAB — URINALYSIS, MICROSCOPIC ONLY

## 2012-05-31 LAB — HEMOGLOBIN A1C: Hgb A1c MFr Bld: 5.8 % — ABNORMAL HIGH (ref <5.7)

## 2012-05-31 LAB — MICROALBUMIN / CREATININE URINE RATIO
Microalb Creat Ratio: 8.3 mg/g (ref 0.0–30.0)
Microalb, Ur: 1.08 mg/dL (ref 0.00–1.89)

## 2012-06-18 ENCOUNTER — Other Ambulatory Visit: Payer: Self-pay | Admitting: Endocrinology

## 2012-07-15 ENCOUNTER — Other Ambulatory Visit: Payer: Self-pay | Admitting: Endocrinology

## 2012-08-21 ENCOUNTER — Other Ambulatory Visit: Payer: Self-pay | Admitting: *Deleted

## 2012-08-21 MED ORDER — CELECOXIB 200 MG PO CAPS: 200.0000 mg | ORAL_CAPSULE | Freq: Every day | ORAL | Status: DC

## 2012-08-22 ENCOUNTER — Other Ambulatory Visit: Payer: Self-pay | Admitting: *Deleted

## 2012-08-22 MED ORDER — CELECOXIB 200 MG PO CAPS: 200.0000 mg | ORAL_CAPSULE | Freq: Every day | ORAL | Status: DC

## 2012-09-03 ENCOUNTER — Ambulatory Visit: Payer: Medicare Other | Admitting: Endocrinology

## 2012-09-24 ENCOUNTER — Ambulatory Visit: Payer: Medicare Other | Admitting: Endocrinology

## 2012-09-30 ENCOUNTER — Other Ambulatory Visit: Payer: Self-pay | Admitting: *Deleted

## 2012-09-30 ENCOUNTER — Telehealth: Payer: Self-pay | Admitting: *Deleted

## 2012-09-30 MED ORDER — HYDROCHLOROTHIAZIDE 25 MG PO TABS: 25.0000 mg | ORAL_TABLET | Freq: Every day | ORAL | Status: DC

## 2012-09-30 MED ORDER — ROSUVASTATIN CALCIUM 10 MG PO TABS: 10.0000 mg | ORAL_TABLET | Freq: Every day | ORAL | Status: DC

## 2012-09-30 NOTE — Telephone Encounter (Signed)
See note from Dr. Everardo All

## 2012-09-30 NOTE — Telephone Encounter (Signed)
Pharmacy request refill on glimepiride 2 mg. Not found on patient med list. See note for labs on 09/24/2011. Please advise.

## 2012-09-30 NOTE — Telephone Encounter (Signed)
Was d/c'ed 4 mos ago

## 2012-09-30 NOTE — Telephone Encounter (Signed)
This med was stopped 4 mos ago

## 2012-10-08 ENCOUNTER — Ambulatory Visit (INDEPENDENT_AMBULATORY_CARE_PROVIDER_SITE_OTHER): Payer: Medicare Other | Admitting: Endocrinology

## 2012-10-08 VITALS — BP 130/80 | HR 81 | Wt 182.0 lb

## 2012-10-08 DIAGNOSIS — E119 Type 2 diabetes mellitus without complications: Secondary | ICD-10-CM

## 2012-10-08 NOTE — Patient Instructions (Signed)
blood tests are being requested for you today.  You will be contacted with results. Please come back for a regular physical appointment in 6 months.

## 2012-10-08 NOTE — Progress Notes (Signed)
  Subjective:    Patient ID: Shelby Jennings, female    DOB: [birth year], 79 y.o.   MRN: [id number]  HPI Pt returns for f/u of type 2 DM (dx'ed 1990; no known complications; she does not check cbg's at home and refuses to).  She tolertes meds well.  She says she takes med as rx'ed.   Past Medical History  Diagnosis Date  . DIABETES MELLITUS, TYPE II 06/24/2009  . HYPERLIPIDEMIA 05/09/2007  . HYPERTENSION 05/09/2007  . OSTEOARTHRITIS 05/09/2007  . Morbid obesity     No past surgical history on file.  History   Social History  . Marital Status: Married    Spouse Name: N/A    Number of Children: N/A  . Years of Education: N/A   Occupational History  . Retired    Social History Main Topics  . Smoking status: Never Smoker   . Smokeless tobacco: Not on file  . Alcohol Use: No  . Drug Use: No  . Sexually Active: Not on file   Other Topics Concern  . Not on file   Social History Narrative   Widowed    Current Outpatient Prescriptions on File Prior to Visit  Medication Sig Dispense Refill  . ACTOS 45 MG tablet TAKE 1 TABLET BY MOUTH EVERY DAY  90 tablet  1  . amLODipine-benazepril (LOTREL) 5-20 MG per capsule Take 2 capsules by mouth daily.        . carvedilol (COREG) 3.125 MG tablet TAKE 1 TABLET BY MOUTH TWICE A DAY  180 tablet  2  . celecoxib (CELEBREX) 200 MG capsule Take 1 capsule (200 mg total) by mouth daily.  90 capsule  2  . hydrochlorothiazide (HYDRODIURIL) 25 MG tablet Take 1 tablet (25 mg total) by mouth daily.  30 tablet  11  . metFORMIN (GLUCOPHAGE) 1000 MG tablet TAKE 2 TABLETS BY MOUTH ONCE DAILY  180 tablet  1  . rosuvastatin (CRESTOR) 10 MG tablet Take 1 tablet (10 mg total) by mouth daily.  30 tablet  11  . triamcinolone (KENALOG) 0.025 % cream APPLY TO AFFECTED AREAS AS NEEDED  80 g  1   No current facility-administered medications on file prior to visit.    Allergies  Allergen Reactions  . Atorvastatin     REACTION: Myalgias    Family History  Problem  Relation Age of Onset  . Cancer Mother     had uncertain type of cancer    BP 130/80  Pulse 81  Wt 182 lb (82.555 kg)  BMI 31.22 kg/m2  SpO2 97%    Review of Systems denies hypoglycemia    Objective:   Physical Exam VITAL SIGNS:  See vs page GENERAL: no distress Pulses: dorsalis pedis intact bilat.   Feet: no deformity.  no ulcer on the feet.  feet are of normal color and temp.  no edema Neuro: sensation is intact to touch on the feet.    Lab Results  Component Value Date   HGBA1C 7.3* 10/08/2012      Assessment & Plan:  DM: therapy limited by lack of cbg's, and by refusal to take some meds

## 2012-10-24 ENCOUNTER — Other Ambulatory Visit: Payer: Self-pay | Admitting: *Deleted

## 2012-10-24 MED ORDER — METFORMIN HCL 1000 MG PO TABS: ORAL_TABLET | ORAL | Status: DC

## 2013-01-22 ENCOUNTER — Other Ambulatory Visit: Payer: Self-pay | Admitting: *Deleted

## 2013-01-22 MED ORDER — TRIAMCINOLONE ACETONIDE 0.025 % EX CREA: TOPICAL_CREAM | CUTANEOUS | Status: DC | PRN

## 2013-04-14 ENCOUNTER — Other Ambulatory Visit: Payer: Self-pay | Admitting: *Deleted

## 2013-04-14 MED ORDER — CARVEDILOL 3.125 MG PO TABS: ORAL_TABLET | ORAL | Status: DC

## 2013-04-30 ENCOUNTER — Other Ambulatory Visit: Payer: Self-pay | Admitting: Endocrinology

## 2013-05-20 ENCOUNTER — Ambulatory Visit (INDEPENDENT_AMBULATORY_CARE_PROVIDER_SITE_OTHER): Payer: Medicare Other | Admitting: Endocrinology

## 2013-05-20 ENCOUNTER — Ambulatory Visit: Payer: Medicare Other | Admitting: Endocrinology

## 2013-05-20 ENCOUNTER — Encounter: Payer: Self-pay | Admitting: Endocrinology

## 2013-05-20 VITALS — BP 124/76 | HR 88 | Wt 177.0 lb

## 2013-05-20 DIAGNOSIS — E785 Hyperlipidemia, unspecified: Secondary | ICD-10-CM

## 2013-05-20 DIAGNOSIS — E119 Type 2 diabetes mellitus without complications: Secondary | ICD-10-CM

## 2013-05-20 DIAGNOSIS — I10 Essential (primary) hypertension: Secondary | ICD-10-CM

## 2013-05-20 DIAGNOSIS — Z79899 Other long term (current) drug therapy: Secondary | ICD-10-CM

## 2013-05-20 LAB — CBC WITH DIFFERENTIAL/PLATELET
Basophils Absolute: 0 10*3/uL (ref 0.0–0.1)
Eosinophils Absolute: 0.1 10*3/uL (ref 0.0–0.7)
HCT: 43.4 % (ref 36.0–46.0)
Hemoglobin: 14.6 g/dL (ref 12.0–15.0)
Lymphs Abs: 2.8 10*3/uL (ref 0.7–4.0)
MCHC: 33.7 g/dL (ref 30.0–36.0)
Monocytes Relative: 12.2 % — ABNORMAL HIGH (ref 3.0–12.0)
Neutro Abs: 5 10*3/uL (ref 1.4–7.7)
Neutrophils Relative %: 55 % (ref 43.0–77.0)
Platelets: 266 10*3/uL (ref 150.0–400.0)
RDW: 14.1 % (ref 11.5–14.6)
WBC: 9.1 10*3/uL (ref 4.5–10.5)

## 2013-05-20 LAB — MICROALBUMIN / CREATININE URINE RATIO
Microalb Creat Ratio: 0.9 mg/g (ref 0.0–30.0)
Microalb, Ur: 1.5 mg/dL (ref 0.0–1.9)

## 2013-05-20 LAB — URINALYSIS, ROUTINE W REFLEX MICROSCOPIC
Hgb urine dipstick: NEGATIVE
Ketones, ur: NEGATIVE
Specific Gravity, Urine: 1.01 (ref 1.000–1.030)
Total Protein, Urine: NEGATIVE
Urine Glucose: NEGATIVE
pH: 7.5 (ref 5.0–8.0)

## 2013-05-20 LAB — BASIC METABOLIC PANEL
BUN: 15 mg/dL (ref 6–23)
CO2: 28 mEq/L (ref 19–32)
Chloride: 98 mEq/L (ref 96–112)
GFR: 80.16 mL/min (ref >60.00)
Glucose, Bld: 120 mg/dL — ABNORMAL HIGH (ref 70–99)
Potassium: 4.2 mEq/L (ref 3.5–5.1)
Sodium: 136 mEq/L (ref 135–145)

## 2013-05-20 LAB — HEPATIC FUNCTION PANEL
ALT: 18 U/L (ref 0–35)
AST: 22 U/L (ref 0–37)
Albumin: 4.3 g/dL (ref 3.5–5.2)
Alkaline Phosphatase: 49 U/L (ref 39–117)
Bilirubin, Direct: 0 mg/dL (ref 0.0–0.3)
Total Protein: 7.9 g/dL (ref 6.0–8.3)

## 2013-05-20 LAB — LIPID PANEL
Cholesterol: 138 mg/dL (ref 0–200)
HDL: 42 mg/dL (ref >39.00)
Triglycerides: 105 mg/dL (ref 0.0–149.0)

## 2013-05-20 LAB — TSH: TSH: 0.84 u[IU]/mL (ref 0.35–5.50)

## 2013-05-20 NOTE — Progress Notes (Signed)
Subjective:    Patient ID: Shelby Jennings, female    DOB: 1924/07/27, 79 y.o.   MRN: [id number]  HPI The state of at least three ongoing medical problems is addressed today, with interval history of each noted here: Pt returns for f/u of type 2 DM (dx'ed 1990; no known complications; she does not check cbg's at home and refuses to).  She tolerates meds well.  Denies weight change Dyslipidemia: he denies chest pain HTN: he denies sob. Past Medical History  Diagnosis Date  . DIABETES MELLITUS, TYPE II 06/24/2009  . HYPERLIPIDEMIA 05/09/2007  . HYPERTENSION 05/09/2007  . OSTEOARTHRITIS 05/09/2007  . Morbid obesity     No past surgical history on file.  History   Social History  . Marital Status: Married    Spouse Name: N/A    Number of Children: N/A  . Years of Education: N/A   Occupational History  . Retired    Social History Main Topics  . Smoking status: Never Smoker   . Smokeless tobacco: Not on file  . Alcohol Use: No  . Drug Use: No  . Sexual Activity: Not on file   Other Topics Concern  . Not on file   Social History Narrative   Widowed    Current Outpatient Prescriptions on File Prior to Visit  Medication Sig Dispense Refill  . ACTOS 45 MG tablet TAKE 1 TABLET BY MOUTH EVERY DAY  90 tablet  1  . amLODipine-benazepril (LOTREL) 5-20 MG per capsule Take 2 capsules by mouth daily.        . carvedilol (COREG) 3.125 MG tablet TAKE 1 TABLET BY MOUTH TWICE A DAY  180 tablet  1  . celecoxib (CELEBREX) 200 MG capsule Take 1 capsule (200 mg total) by mouth daily.  90 capsule  2  . hydrochlorothiazide (HYDRODIURIL) 25 MG tablet Take 1 tablet (25 mg total) by mouth daily.  30 tablet  11  . metFORMIN (GLUCOPHAGE) 1000 MG tablet Take 2 tablets by mouth once daily  180 tablet  1  . rosuvastatin (CRESTOR) 10 MG tablet Take 1 tablet (10 mg total) by mouth daily.  30 tablet  11  . triamcinolone (KENALOG) 0.025 % cream APPLY TOPICALLY AS NEEDED.  80 g  1   No current  facility-administered medications on file prior to visit.    Allergies  Allergen Reactions  . Atorvastatin     REACTION: Myalgias    Family History  Problem Relation Age of Onset  . Cancer Mother     had uncertain type of cancer   BP 124/76  Pulse 88  Wt 177 lb (80.287 kg)  BMI 30.37 kg/m2  SpO2 96%  Review of Systems denies hypoglycemia and cough    Objective:   Physical Exam VITAL SIGNS:  See vs page GENERAL: no distress  Lab Results  Component Value Date   WBC 9.1 05/20/2013   HGB 14.6 05/20/2013   HCT 43.4 05/20/2013   PLT 266.0 05/20/2013   GLUCOSE 120* 05/20/2013   CHOL 138 05/20/2013   TRIG 105.0 05/20/2013   HDL 42.00 05/20/2013   LDLDIRECT 180.9 06/22/2008   LDLCALC 75 05/20/2013   ALT 18 05/20/2013   AST 22 05/20/2013   NA 136 05/20/2013   K 4.2 05/20/2013   CL 98 05/20/2013   CREATININE 0.9 05/20/2013   BUN 15 05/20/2013   CO2 28 05/20/2013   TSH 0.84 05/20/2013   HGBA1C 6.9* 05/20/2013   MICROALBUR 1.5 05/20/2013  Assessment & Plan:  DM: well-controlled Dyslipidemia: well-controlled HTN: well-controlled

## 2013-05-20 NOTE — Patient Instructions (Signed)
blood and urine tests are being requested for you today.  You will be contacted with results. Please come back for a regular physical appointment after 05/30/13.

## 2013-05-28 ENCOUNTER — Other Ambulatory Visit: Payer: Self-pay | Admitting: Endocrinology

## 2013-07-14 ENCOUNTER — Encounter: Payer: Self-pay | Admitting: Endocrinology

## 2013-07-14 ENCOUNTER — Ambulatory Visit (INDEPENDENT_AMBULATORY_CARE_PROVIDER_SITE_OTHER): Payer: Medicare Other | Admitting: Endocrinology

## 2013-07-14 VITALS — BP 116/82 | HR 79 | Temp 97.5°F | Ht 64.0 in | Wt 172.5 lb

## 2013-07-14 DIAGNOSIS — Z0000 Encounter for general adult medical examination without abnormal findings: Secondary | ICD-10-CM

## 2013-07-14 NOTE — Patient Instructions (Signed)
please consider these measures for your health:  minimize alcohol.  do not use tobacco products.  have a colonoscopy at least every 10 years from age 77.  Women should have an annual mammogram from age 40.  keep firearms safely stored.  always use seat belts.  have working smoke alarms in your home.  see an eye doctor and dentist regularly.  never drive under the influence of alcohol or drugs (including prescription drugs).   it is critically important to prevent falling down (keep floor areas well-lit, dry, and free of loose objects.  If you have a cane, walker, or wheelchair, you should use it, even for short trips around the house.  Also, try not to rush).  Please come back for a follow-up appointment in 6 months.   

## 2013-07-14 NOTE — Progress Notes (Signed)
Subjective:    Patient ID: Shelby Jennings, female    DOB: [birth year], 79 y.o.   MRN: [id number]  HPI Pt is here for regular wellness examination, and is feeling pretty well in general, and says chronic med probs are stable, except as noted below.  She refuses cancer screening.  i have removed lotrel and actos from med list, as pt says she does not take. Past Medical History  Diagnosis Date  . DIABETES MELLITUS, TYPE II 06/24/2009  . HYPERLIPIDEMIA 05/09/2007  . HYPERTENSION 05/09/2007  . OSTEOARTHRITIS 05/09/2007  . Morbid obesity     No past surgical history on file.  History   Social History  . Marital Status: Married    Spouse Name: N/A    Number of Children: N/A  . Years of Education: N/A   Occupational History  . Retired    Social History Main Topics  . Smoking status: Never Smoker   . Smokeless tobacco: Not on file  . Alcohol Use: No  . Drug Use: No  . Sexual Activity: Not on file   Other Topics Concern  . Not on file   Social History Narrative   Widowed    Current Outpatient Prescriptions on File Prior to Visit  Medication Sig Dispense Refill  . carvedilol (COREG) 3.125 MG tablet TAKE 1 TABLET BY MOUTH TWICE A DAY  180 tablet  1  . CELEBREX 200 MG capsule TAKE 1 CAPSULE (200 MG TOTAL) BY MOUTH DAILY.  90 capsule  2  . hydrochlorothiazide (HYDRODIURIL) 25 MG tablet Take 1 tablet (25 mg total) by mouth daily.  30 tablet  11  . metFORMIN (GLUCOPHAGE) 1000 MG tablet Take 2 tablets by mouth once daily  180 tablet  1  . rosuvastatin (CRESTOR) 10 MG tablet Take 1 tablet (10 mg total) by mouth daily.  30 tablet  11  . triamcinolone (KENALOG) 0.025 % cream APPLY TOPICALLY AS NEEDED.  80 g  1   No current facility-administered medications on file prior to visit.    Allergies  Allergen Reactions  . Atorvastatin     REACTION: Myalgias    Family History  Problem Relation Age of Onset  . Cancer Mother     had uncertain type of cancer    BP 116/82  Pulse 79   Temp(Src) 97.5 F (36.4 C) (Oral)  Ht 5\' 4"  (1.626 m)  Wt 172 lb 8 oz (78.245 kg)  BMI 29.59 kg/m2  SpO2 97%    Review of Systems  Constitutional: Negative for fever.       Pt has lost a few lbs  HENT: Negative for ear pain.   Eyes: Negative for visual disturbance.  Respiratory: Negative for shortness of breath.   Cardiovascular: Negative for chest pain.  Gastrointestinal: Negative for anal bleeding.  Endocrine: Negative for cold intolerance.  Genitourinary: Negative for hematuria and vaginal bleeding.  Musculoskeletal: Negative for back pain.  Skin: Negative for rash.  Allergic/Immunologic: Negative for environmental allergies.  Neurological: Negative for syncope and numbness.  Hematological: Does not bruise/bleed easily.  Psychiatric/Behavioral: Negative for dysphoric mood.       Objective:   Physical Exam VS: see vs page GEN: no distress HEAD: head: no deformity eyes: no periorbital swelling, no proptosis external nose and ears are normal mouth: no lesion seen NECK: supple, thyroid is not enlarged CHEST WALL: no deformity LUNGS:  Clear to auscultation BREASTS: declined CV: reg rate and rhythm, no murmur ABD: abdomen is soft, nontender.  no hepatosplenomegaly.  not distended.  no hernia GENITALIA/RECTAL: declined.   MUSCULOSKELETAL: muscle bulk and strength are grossly normal.  no obvious joint swelling.  gait is normal and steady.   PULSES: dorsalis pedis intact bilat.   NEURO:  cn 2-12 grossly intact.   readily moves all 4's.   SKIN:  Normal texture and temperature.  No rash or suspicious lesion is visible.   NODES:  None palpable at the neck PSYCH: alert, oriented x3.  Does not appear anxious nor depressed.   i reviewed electrocardiogram.    Assessment & Plan:  Wellness visit today, with problems stable, except as noted. we discussed code status.  pt requests full code, but would not want to be started or maintained on artificial life-support measures if  there was not a reasonable chance of recovery

## 2013-08-17 ENCOUNTER — Other Ambulatory Visit: Payer: Self-pay | Admitting: Endocrinology

## 2013-09-25 ENCOUNTER — Other Ambulatory Visit: Payer: Self-pay | Admitting: Endocrinology

## 2013-10-19 ENCOUNTER — Other Ambulatory Visit: Payer: Self-pay | Admitting: Endocrinology

## 2013-11-12 ENCOUNTER — Other Ambulatory Visit: Payer: Self-pay

## 2013-11-12 MED ORDER — CARVEDILOL 3.125 MG PO TABS: ORAL_TABLET | ORAL | Status: DC

## 2014-01-01 ENCOUNTER — Other Ambulatory Visit: Payer: Self-pay | Admitting: Endocrinology

## 2014-01-27 ENCOUNTER — Other Ambulatory Visit: Payer: Self-pay | Admitting: Endocrinology

## 2014-02-04 ENCOUNTER — Other Ambulatory Visit: Payer: Self-pay | Admitting: Endocrinology

## 2014-02-05 ENCOUNTER — Other Ambulatory Visit: Payer: Self-pay | Admitting: Endocrinology

## 2014-02-05 MED ORDER — CELECOXIB 200 MG PO CAPS: ORAL_CAPSULE | ORAL | Status: DC

## 2014-02-05 NOTE — Addendum Note (Signed)
Addended by: Moody Bruins E on: 02/05/2014 01:24 PM   Modules accepted: Orders

## 2014-02-05 NOTE — Telephone Encounter (Signed)
[facility] call [facility] they are stating we did not send the Celebrex in but I see we did on 01/27/14. Once this is straightened out please call pt to let her know. Thank you!

## 2014-03-01 ENCOUNTER — Other Ambulatory Visit: Payer: Self-pay

## 2014-03-01 MED ORDER — TRIAMCINOLONE ACETONIDE 0.025 % EX CREA: TOPICAL_CREAM | CUTANEOUS | Status: DC

## 2014-03-04 LAB — HM DIABETES EYE EXAM

## 2014-03-09 ENCOUNTER — Encounter: Payer: Self-pay | Admitting: Endocrinology

## 2014-04-29 ENCOUNTER — Encounter: Payer: Self-pay | Admitting: Endocrinology

## 2014-04-29 ENCOUNTER — Telehealth: Payer: Self-pay | Admitting: Endocrinology

## 2014-04-29 ENCOUNTER — Ambulatory Visit (INDEPENDENT_AMBULATORY_CARE_PROVIDER_SITE_OTHER): Payer: Medicare Other | Admitting: Endocrinology

## 2014-04-29 VITALS — BP 130/84 | HR 79 | Temp 97.9°F | Ht 64.0 in | Wt 175.0 lb

## 2014-04-29 DIAGNOSIS — E119 Type 2 diabetes mellitus without complications: Secondary | ICD-10-CM

## 2014-04-29 DIAGNOSIS — Z2911 Encounter for prophylactic immunotherapy for respiratory syncytial virus (RSV): Secondary | ICD-10-CM

## 2014-04-29 DIAGNOSIS — Z23 Encounter for immunization: Secondary | ICD-10-CM

## 2014-04-29 LAB — TSH: TSH: 0.67 u[IU]/mL (ref 0.35–4.50)

## 2014-04-29 NOTE — Patient Instructions (Addendum)
Please come back for a regular physical appointment in January.   A diabetes blood requested for you today.  We'll contact you with results.

## 2014-04-29 NOTE — Telephone Encounter (Signed)
please call lab: Please add a1c to today's blood drawn, thank you

## 2014-04-29 NOTE — Progress Notes (Signed)
   Subjective:    Patient ID: Shelby Jennings, female    DOB: 03/05/1924, 80 y.o.   MRN: [id number]  HPI Pt returns for f/u of diabetes mellitus:  DM type: 2 Dx'ed: 8937 Complications: none Therapy: metformin GDM: never DKA: never Severe hypoglycemia: never Pancreatitis: never Other info: she does not check cbg's.  Interval history: pt states she feels well in general.   She takes metformin as rx'ed. Past Medical History  Diagnosis Date  . DIABETES MELLITUS, TYPE II 06/24/2009  . HYPERLIPIDEMIA 05/09/2007  . HYPERTENSION 05/09/2007  . OSTEOARTHRITIS 05/09/2007  . Morbid obesity     No past surgical history on file.  History   Social History  . Marital Status: Married    Spouse Name: N/A    Number of Children: N/A  . Years of Education: N/A   Occupational History  . Retired    Social History Main Topics  . Smoking status: Never Smoker   . Smokeless tobacco: Not on file  . Alcohol Use: No  . Drug Use: No  . Sexual Activity: Not on file   Other Topics Concern  . Not on file   Social History Narrative   Widowed    Current Outpatient Prescriptions on File Prior to Visit  Medication Sig Dispense Refill  . carvedilol (COREG) 3.125 MG tablet TAKE 1 TABLET BY MOUTH TWICE A DAY  180 tablet  1  . celecoxib (CELEBREX) 200 MG capsule TAKE 1 CAPSULE (200 MG TOTAL) BY MOUTH DAILY.  90 capsule  0  . CRESTOR 10 MG tablet TAKE 1 TABLET (10 MG TOTAL) BY MOUTH DAILY.  30 tablet  10  . hydrochlorothiazide (HYDRODIURIL) 25 MG tablet TAKE 1 TABLET BY MOUTH EVERY DAY  30 tablet  11  . metFORMIN (GLUCOPHAGE) 1000 MG tablet TAKE 2 TABLETS BY MOUTH ONCE DAILY  180 tablet  1  . triamcinolone (KENALOG) 0.025 % cream APPLY TOPICALLY AS NEEDED.  80 g  1   No current facility-administered medications on file prior to visit.    Allergies  Allergen Reactions  . Atorvastatin     REACTION: Myalgias    Family History  Problem Relation Age of Onset  . Cancer Mother     had uncertain  type of cancer    BP 130/84  Pulse 79  Temp(Src) 97.9 F (36.6 C) (Oral)  Ht 5\' 4"  (1.626 m)  Wt 175 lb (79.379 kg)  BMI 30.02 kg/m2  SpO2 95%    Review of Systems Denies weight change.      Objective:   Physical Exam VITAL SIGNS:  See vs page GENERAL: no distress Pulses: dorsalis pedis intact bilat.   Feet: no deformity.  no edema.   Skin:  no ulcer on the feet.  normal color and temp. Neuro: sensation is intact to touch on the feet.        Assessment & Plan:  DM: on metformin  Patient is advised the following: Patient Instructions  Please come back for a regular physical appointment in January.   A diabetes blood requested for you today.  We'll contact you with results.

## 2014-04-30 NOTE — Telephone Encounter (Signed)
Lab called and stated they could not add A1C test.

## 2014-04-30 NOTE — Telephone Encounter (Signed)
Request to add on lab sent to lab.

## 2014-06-10 ENCOUNTER — Telehealth: Payer: Self-pay | Admitting: Endocrinology

## 2014-06-11 ENCOUNTER — Ambulatory Visit (INDEPENDENT_AMBULATORY_CARE_PROVIDER_SITE_OTHER): Payer: Medicare Other | Admitting: Endocrinology

## 2014-06-11 ENCOUNTER — Encounter: Payer: Self-pay | Admitting: Endocrinology

## 2014-06-11 VITALS — BP 142/88 | HR 80 | Temp 98.2°F | Ht 64.0 in | Wt 172.0 lb

## 2014-06-11 DIAGNOSIS — R413 Other amnesia: Secondary | ICD-10-CM

## 2014-06-11 DIAGNOSIS — E119 Type 2 diabetes mellitus without complications: Secondary | ICD-10-CM

## 2014-06-11 LAB — BASIC METABOLIC PANEL
BUN: 13 mg/dL (ref 6–23)
CALCIUM: 9.6 mg/dL (ref 8.4–10.5)
CO2: 32 mEq/L (ref 19–32)
Chloride: 98 mEq/L (ref 96–112)
Creatinine, Ser: 0.9 mg/dL (ref 0.4–1.2)
GFR: 73.98 mL/min (ref >60.00)
Glucose, Bld: 167 mg/dL — ABNORMAL HIGH (ref 70–99)
POTASSIUM: 3.6 meq/L (ref 3.5–5.1)
Sodium: 135 mEq/L (ref 135–145)

## 2014-06-11 LAB — TSH: TSH: 0.34 u[IU]/mL — ABNORMAL LOW (ref 0.35–4.50)

## 2014-06-11 LAB — HEMOGLOBIN A1C: Hgb A1c MFr Bld: 6.8 % — ABNORMAL HIGH (ref 4.6–6.5)

## 2014-06-11 LAB — VITAMIN B12: Vitamin B-12: 162 pg/mL — ABNORMAL LOW (ref 211–911)

## 2014-06-11 MED ORDER — MELOXICAM 7.5 MG PO TABS: 7.5000 mg | ORAL_TABLET | Freq: Every day | ORAL | Status: DC

## 2014-06-11 MED ORDER — MISOPROSTOL 200 MCG PO TABS: 200.0000 ug | ORAL_TABLET | Freq: Four times a day (QID) | ORAL | Status: DC

## 2014-06-11 MED ORDER — DONEPEZIL HCL 5 MG PO TABS: 5.0000 mg | ORAL_TABLET | Freq: Every day | ORAL | Status: DC

## 2014-06-11 NOTE — Patient Instructions (Addendum)
Please change celebrex to meloxicam.  Because this can bother your stomach, you should take a misoprostal along with this.  You dont have to take these daily--just a needed.   Please come back for a regular physical appointment in January.   A diabetes blood requested for you today.  We'll contact you with results.  i have sent a prescription to your pharmacy, for the memory.

## 2014-06-11 NOTE — Progress Notes (Signed)
   Subjective:    Patient ID: Shelby Jennings, female    DOB: 07-14-1924, 80 y.o.   MRN: [id number]  HPI Pt returns for f/u of diabetes mellitus: DM type: 2 Dx'ed: 4332 Complications: none Therapy: metformin GDM: never DKA: never Severe hypoglycemia: never Pancreatitis: never Other info: she does not check cbg's. Interval history: denies weight change.  She takes metformin as rx'ed. OA: ins will no longer pay for celebrex.  She says she seldom has arthralgias.   Pt reports mild memory loss.  No falls.   Past Medical History  Diagnosis Date  . DIABETES MELLITUS, TYPE II 06/24/2009  . HYPERLIPIDEMIA 05/09/2007  . HYPERTENSION 05/09/2007  . OSTEOARTHRITIS 05/09/2007  . Morbid obesity     No past surgical history on file.  History   Social History  . Marital Status: Married    Spouse Name: N/A    Number of Children: N/A  . Years of Education: N/A   Occupational History  . Retired    Social History Main Topics  . Smoking status: Never Smoker   . Smokeless tobacco: Not on file  . Alcohol Use: No  . Drug Use: No  . Sexual Activity: Not on file   Other Topics Concern  . Not on file   Social History Narrative   Widowed    Current Outpatient Prescriptions on File Prior to Visit  Medication Sig Dispense Refill  . carvedilol (COREG) 3.125 MG tablet TAKE 1 TABLET BY MOUTH TWICE A DAY 180 tablet 1  . CRESTOR 10 MG tablet TAKE 1 TABLET (10 MG TOTAL) BY MOUTH DAILY. 30 tablet 10  . hydrochlorothiazide (HYDRODIURIL) 25 MG tablet TAKE 1 TABLET BY MOUTH EVERY DAY 30 tablet 11  . metFORMIN (GLUCOPHAGE) 1000 MG tablet TAKE 2 TABLETS BY MOUTH ONCE DAILY 180 tablet 1  . triamcinolone (KENALOG) 0.025 % cream APPLY TOPICALLY AS NEEDED. 80 g 1   No current facility-administered medications on file prior to visit.    Allergies  Allergen Reactions  . Atorvastatin     REACTION: Myalgias    Family History  Problem Relation Age of Onset  . Cancer Mother     had uncertain type of  cancer    BP 142/88 mmHg  Pulse 80  Temp(Src) 98.2 F (36.8 C) (Oral)  Ht 5\' 4"  (1.626 m)  Wt 172 lb (78.019 kg)  BMI 29.51 kg/m2  SpO2 96%   Review of Systems Denies abd pain and BRBPR.     Objective:   Physical Exam VITAL SIGNS:  See vs page GENERAL: no distress Gait: normal and steady.  PSYCH: Alert and well-oriented.  Does not appear anxious nor depressed.    Lab Results  Component Value Date   HGBA1C 6.8* 06/11/2014       Assessment & Plan:  OA: she needs only prn rx. DM: well-controlled Memory loss, new.  Mild.   Patient is advised the following: Patient Instructions  Please change celebrex to meloxicam.  Because this can bother your stomach, you should take a misoprostal along with this.  You dont have to take these daily--just a needed.   Please come back for a regular physical appointment in January.   A diabetes blood requested for you today.  We'll contact you with results.  i have sent a prescription to your pharmacy, for the memory.

## 2014-06-17 ENCOUNTER — Other Ambulatory Visit: Payer: Self-pay | Admitting: Endocrinology

## 2014-06-17 MED ORDER — CARVEDILOL 3.125 MG PO TABS: ORAL_TABLET | ORAL | Status: DC

## 2014-06-17 NOTE — Telephone Encounter (Signed)
Pt says this was refused by ins company, so please decline refill

## 2014-06-17 NOTE — Telephone Encounter (Signed)
Received a refill request on Celebrex 200 mg. Please advise if ok to refill. Med is not on current list.  Thanks!

## 2014-06-18 ENCOUNTER — Other Ambulatory Visit: Payer: Self-pay | Admitting: Endocrinology

## 2014-06-18 NOTE — Telephone Encounter (Signed)
Please advise refill for Celebrex? Rx has been discontinued. Last refill 01/27/2014 #90 x0.

## 2014-07-05 ENCOUNTER — Other Ambulatory Visit: Payer: Self-pay | Admitting: Endocrinology

## 2014-07-06 ENCOUNTER — Telehealth: Payer: Self-pay | Admitting: Endocrinology

## 2014-07-06 ENCOUNTER — Other Ambulatory Visit: Payer: Self-pay

## 2014-07-06 NOTE — Telephone Encounter (Signed)
Patient would like all her medications refilled  Also, she states Dr. Loanne Drilling took her off the celebrex and put her on something new  Thank you

## 2014-07-06 NOTE — Telephone Encounter (Signed)
Pt needs celebrex called in along with all of her new meds that Dr.Ellison has prescribed per pt  She is completely out of the celebrex

## 2014-07-06 NOTE — Telephone Encounter (Signed)
Requested forward to MD. Celebrex prescription is correct. Prescription refills sent.

## 2014-07-06 NOTE — Telephone Encounter (Signed)
Rx for Celebrex and other prescriptions sent per pt's request.

## 2014-07-14 ENCOUNTER — Telehealth: Payer: Self-pay | Admitting: Endocrinology

## 2014-07-14 NOTE — Telephone Encounter (Signed)
Called [facility]. Pharmacy has medications on file. Pt will be able to pick up medications tomorrow. Tried to contact pt unable to do so. Will try again at a later time.

## 2014-07-14 NOTE — Telephone Encounter (Signed)
Pt has not been able to get the meds Dr. Cordelia Pen rx - she needs the rx sent again-the pharmacy is saying they did not get them. [facility] to get this straight and call the pt when this is done.

## 2014-07-14 NOTE — Telephone Encounter (Signed)
Pt advised of note below and voiced understanding.  

## 2014-07-15 ENCOUNTER — Telehealth: Payer: Self-pay | Admitting: Endocrinology

## 2014-07-15 NOTE — Telephone Encounter (Signed)
Patient states that she needs her medication refilled I advised patient to call her pharmacy to see if they were filled  Patient did seem confused as she stated that the medications were new and she did not believe they were filled    Please advise Shelby Jennings   Thank you

## 2014-07-16 NOTE — Telephone Encounter (Signed)
Pt advised the medications that were picked up from the pharmacy on 07/15/2014 were correct.  Pt voiced understanding.

## 2014-09-21 ENCOUNTER — Other Ambulatory Visit: Payer: Self-pay | Admitting: Endocrinology

## 2014-11-11 NOTE — Telephone Encounter (Signed)
Error

## 2014-11-13 ENCOUNTER — Other Ambulatory Visit: Payer: Self-pay | Admitting: Endocrinology

## 2014-12-18 ENCOUNTER — Other Ambulatory Visit: Payer: Self-pay | Admitting: Endocrinology

## 2015-02-04 ENCOUNTER — Telehealth: Payer: Self-pay | Admitting: Endocrinology

## 2015-02-04 NOTE — Telephone Encounter (Signed)
Nurse [place] called stating that she had a home visit on 6.24.16   Per Enid Derry the patient Shelby Jennings has been experiencing a memory deficit  Patient did not know the Day, Month, or Year. Wednesday, Thursday and Friday pills were still in her pill container    Gonzella Lex [phone]  Please advise    Thank you

## 2015-02-07 NOTE — Telephone Encounter (Signed)
I tried to contact the pt. No answer when I tried the pt's house. I contacted the NP shirley and advised her message has been received and we are trying to contact the pt.

## 2015-02-07 NOTE — Telephone Encounter (Signed)
Ov next available 

## 2015-02-07 NOTE — Telephone Encounter (Signed)
See note below and please advise, Thanks! 

## 2015-02-08 NOTE — Telephone Encounter (Signed)
Pt coming for a appointment on 01/31/2015.

## 2015-03-02 ENCOUNTER — Ambulatory Visit: Payer: Self-pay | Admitting: Endocrinology

## 2015-03-02 DIAGNOSIS — Z0289 Encounter for other administrative examinations: Secondary | ICD-10-CM

## 2015-03-11 DIAGNOSIS — E119 Type 2 diabetes mellitus without complications: Secondary | ICD-10-CM | POA: Diagnosis not present

## 2015-03-11 DIAGNOSIS — H25813 Combined forms of age-related cataract, bilateral: Secondary | ICD-10-CM | POA: Diagnosis not present

## 2015-03-11 LAB — HM DIABETES EYE EXAM

## 2015-03-25 ENCOUNTER — Other Ambulatory Visit: Payer: Self-pay | Admitting: Endocrinology

## 2015-03-25 NOTE — Telephone Encounter (Signed)
Please refill each x 2 months cpx is due

## 2015-03-25 NOTE — Telephone Encounter (Signed)
Please advise if ok to refill. Last office visit was 06/11/2014. Thanks!

## 2015-03-25 NOTE — Telephone Encounter (Signed)
Rx sent 

## 2015-05-02 ENCOUNTER — Encounter: Payer: Self-pay | Admitting: Endocrinology

## 2015-05-02 ENCOUNTER — Ambulatory Visit (INDEPENDENT_AMBULATORY_CARE_PROVIDER_SITE_OTHER): Payer: Medicare Other | Admitting: Endocrinology

## 2015-05-02 VITALS — BP 138/88 | HR 84 | Temp 97.9°F | Ht 64.0 in | Wt 166.0 lb

## 2015-05-02 DIAGNOSIS — E785 Hyperlipidemia, unspecified: Secondary | ICD-10-CM | POA: Diagnosis not present

## 2015-05-02 DIAGNOSIS — Z23 Encounter for immunization: Secondary | ICD-10-CM

## 2015-05-02 DIAGNOSIS — Z0000 Encounter for general adult medical examination without abnormal findings: Secondary | ICD-10-CM | POA: Diagnosis not present

## 2015-05-02 DIAGNOSIS — E079 Disorder of thyroid, unspecified: Secondary | ICD-10-CM

## 2015-05-02 DIAGNOSIS — E119 Type 2 diabetes mellitus without complications: Secondary | ICD-10-CM | POA: Diagnosis not present

## 2015-05-02 DIAGNOSIS — I10 Essential (primary) hypertension: Secondary | ICD-10-CM

## 2015-05-02 DIAGNOSIS — R413 Other amnesia: Secondary | ICD-10-CM

## 2015-05-02 LAB — POCT GLYCOSYLATED HEMOGLOBIN (HGB A1C): Hemoglobin A1C: 6.1

## 2015-05-02 MED ORDER — ROSUVASTATIN CALCIUM 10 MG PO TABS: ORAL_TABLET | ORAL | Status: DC

## 2015-05-02 MED ORDER — HYDROCHLOROTHIAZIDE 25 MG PO TABS: 25.0000 mg | ORAL_TABLET | Freq: Every day | ORAL | Status: DC

## 2015-05-02 MED ORDER — CARVEDILOL 3.125 MG PO TABS: 3.1250 mg | ORAL_TABLET | Freq: Two times a day (BID) | ORAL | Status: DC

## 2015-05-02 MED ORDER — METFORMIN HCL 1000 MG PO TABS: 2000.0000 mg | ORAL_TABLET | Freq: Every day | ORAL | Status: DC

## 2015-05-02 NOTE — Progress Notes (Signed)
Subjective:    Patient ID: Shelby Jennings, female    DOB: [birth year], 81 y.o.   MRN: [id number]  HPI Pt is here for regular wellness examination, and is feeling pretty well in general, and says chronic med probs are stable, except as noted below Past Medical History  Diagnosis Date  . DIABETES MELLITUS, TYPE II 06/24/2009  . HYPERLIPIDEMIA 05/09/2007  . HYPERTENSION 05/09/2007  . OSTEOARTHRITIS 05/09/2007  . Morbid obesity     No past surgical history on file.  Social History   Social History  . Marital Status: Married    Spouse Name: N/A  . Number of Children: N/A  . Years of Education: N/A   Occupational History  . Retired    Social History Main Topics  . Smoking status: Never Smoker   . Smokeless tobacco: Not on file  . Alcohol Use: No  . Drug Use: No  . Sexual Activity: Not on file   Other Topics Concern  . Not on file   Social History Narrative   Widowed    Current Outpatient Prescriptions on File Prior to Visit  Medication Sig Dispense Refill  . cyanocobalamin (,VITAMIN B-12,) 1000 MCG/ML injection Inject 1,000 mcg into the muscle every 30 (thirty) days.     No current facility-administered medications on file prior to visit.    Allergies  Allergen Reactions  . Atorvastatin     REACTION: Myalgias    Family History  Problem Relation Age of Onset  . Cancer Mother     had uncertain type of cancer    BP 138/88 mmHg  Pulse 84  Temp(Src) 97.9 F (36.6 C) (Oral)  Ht 5\' 4"  (1.626 m)  Wt 166 lb (75.297 kg)  BMI 28.48 kg/m2  SpO2 97%  Review of Systems  Constitutional: Negative for fever.  HENT: Negative for hearing loss.   Eyes: Negative for visual disturbance.  Respiratory: Negative for shortness of breath.   Cardiovascular: Negative for chest pain.  Gastrointestinal: Negative for anal bleeding.  Endocrine: Negative for cold intolerance.  Genitourinary: Negative for hematuria.  Musculoskeletal: Negative for back pain.  Skin: Negative for  rash.  Allergic/Immunologic: Negative for environmental allergies.  Neurological: Negative for syncope and numbness.  Hematological: Does not bruise/bleed easily.  Psychiatric/Behavioral: Negative for dysphoric mood.       Objective:   Physical Exam VS: see vs page GEN: no distress HEAD: head: no deformity eyes: no periorbital swelling, no proptosis external nose and ears are normal mouth: no lesion seen NECK: left thyroid mass is again noted CHEST WALL: no deformity LUNGS:  Clear to auscultation CV: reg rate and rhythm, no murmur ABD: abdomen is soft, nontender.  no hepatosplenomegaly.  not distended.  no hernia MUSCULOSKELETAL: muscle bulk and strength are grossly normal.  no obvious joint swelling.  gait is normal and steady EXTEMITIES: no deformity.  no ulcer on the feet.  feet are of normal color and temp.  no edema PULSES: dorsalis pedis intact bilat.  no carotid bruit NEURO:  cn 2-12 grossly intact.   readily moves all 4's.  sensation is intact to touch on the feet SKIN:  Normal texture and temperature.  No rash or suspicious lesion is visible.   NODES:  None palpable at the neck.  PSYCH: alert, well-oriented.  Does not appear anxious nor depressed.  Electrocardiogram, aricept, and DEXA are declined.       Assessment & Plan:  Wellness visit today, with problems stable, except as noted.  Patient is advised  the following: Patient Instructions  blood tests are requested for you today.  We'll let you know about the results. please consider these measures for your health:  minimize alcohol.  do not use tobacco products.  have a colonoscopy at least every 10 years from age 7.  Women should have an annual mammogram from age 52.  keep firearms safely stored.  always use seat belts.  have working smoke alarms in your home.  see an eye doctor and dentist regularly.  never drive under the influence of alcohol or drugs (including prescription drugs).   it is critically important to  prevent falling down (keep floor areas well-lit, dry, and free of loose objects.  If you have a cane, walker, or wheelchair, you should use it, even for short trips around the house.  Also, try not to rush).   Please come back for a follow-up appointment in 6 months.

## 2015-05-02 NOTE — Progress Notes (Signed)
we discussed code status.  pt requests full code, but would not want to be started or maintained on artificial life-support measures if there was not a reasonable chance of recovery 

## 2015-05-02 NOTE — Patient Instructions (Addendum)
blood tests are requested for you today.  We'll let you know about the results. please consider these measures for your health:  minimize alcohol.  do not use tobacco products.  have a colonoscopy at least every 10 years from age 79.  Women should have an annual mammogram from age 40.  keep firearms safely stored.  always use seat belts.  have working smoke alarms in your home.  see an eye doctor and dentist regularly.  never drive under the influence of alcohol or drugs (including prescription drugs).   it is critically important to prevent falling down (keep floor areas well-lit, dry, and free of loose objects.  If you have a cane, walker, or wheelchair, you should use it, even for short trips around the house.  Also, try not to rush). Please come back for a follow-up appointment in 6 months.  

## 2015-05-04 ENCOUNTER — Other Ambulatory Visit: Payer: Self-pay | Admitting: Endocrinology

## 2015-05-04 ENCOUNTER — Telehealth: Payer: Self-pay | Admitting: Endocrinology

## 2015-05-04 MED ORDER — CELECOXIB 200 MG PO CAPS: ORAL_CAPSULE | ORAL | Status: DC

## 2015-05-04 NOTE — Telephone Encounter (Signed)
Called patient twice no anwser

## 2015-05-04 NOTE — Telephone Encounter (Signed)
i refilled 

## 2015-05-04 NOTE — Telephone Encounter (Signed)
See note below and please advise if ok to refill. Rx is not on current medication list.

## 2015-05-04 NOTE — Telephone Encounter (Signed)
Patient refill of her Celebrex [facility] to [facility] [address]

## 2015-06-29 ENCOUNTER — Other Ambulatory Visit: Payer: Self-pay | Admitting: Endocrinology

## 2015-06-29 NOTE — Telephone Encounter (Signed)
Please advise if ok to refill. Med is not on current med list.  

## 2015-09-16 ENCOUNTER — Telehealth: Payer: Self-pay | Admitting: Endocrinology

## 2015-09-16 MED ORDER — CELECOXIB 200 MG PO CAPS: ORAL_CAPSULE | ORAL | Status: DC

## 2015-09-16 NOTE — Telephone Encounter (Signed)
Patient called stating she would like a refill on her medication   Rx: Celebrex  Pharmacy: [facility]    Thank You

## 2015-09-16 NOTE — Telephone Encounter (Signed)
Rx submitted

## 2015-10-30 NOTE — Progress Notes (Signed)
Subjective:    Patient ID: Shelby Jennings, female    DOB: July 31, 1924, 82 y.o.   MRN: [id number]  HPI Pt returns for f/u of diabetes mellitus: DM type: 2 Dx'ed: [id number] Complications: none Therapy: metformin GDM: never DKA: never Severe hypoglycemia: never.  Pancreatitis: never.  Other info: she does not check cbg's.  Interval history: denies weight change.  She takes metformin as rx'ed.  Past Medical History  Diagnosis Date  . DIABETES MELLITUS, TYPE II 06/24/2009  . HYPERLIPIDEMIA 05/09/2007  . HYPERTENSION 05/09/2007  . OSTEOARTHRITIS 05/09/2007  . Morbid obesity ([place])     No past surgical history on file.  Social History   Social History  . Marital Status: Married    Spouse Name: N/A  . Number of Children: N/A  . Years of Education: N/A   Occupational History  . Retired    Social History Main Topics  . Smoking status: Never Smoker   . Smokeless tobacco: Not on file  . Alcohol Use: No  . Drug Use: No  . Sexual Activity: Not on file   Other Topics Concern  . Not on file   Social History Narrative   Widowed    Current Outpatient Prescriptions on File Prior to Visit  Medication Sig Dispense Refill  . carvedilol (COREG) 3.125 MG tablet Take 1 tablet (3.125 mg total) by mouth 2 (two) times daily. 180 tablet 3  . celecoxib (CELEBREX) 200 MG capsule TAKE 1 TABLET BY MOUTH EVERY DAY 90 capsule 3  . hydrochlorothiazide (HYDRODIURIL) 25 MG tablet Take 1 tablet (25 mg total) by mouth daily. 90 tablet 3  . metFORMIN (GLUCOPHAGE) 1000 MG tablet Take 2 tablets (2,000 mg total) by mouth daily. 180 tablet 3  . rosuvastatin (CRESTOR) 10 MG tablet TAKE 1 TABLET (10 MG TOTAL) BY MOUTH DAILY. 90 tablet 3  . triamcinolone (KENALOG) 0.025 % cream APPLY TOPICALLY AS NEEDED. 80 g 1  . cyanocobalamin (,VITAMIN B-12,) 1000 MCG/ML injection Inject 1,000 mcg into the muscle every 30 (thirty) days. Reported on 10/31/2015     No current facility-administered medications on file prior to  visit.    Allergies  Allergen Reactions  . Atorvastatin     REACTION: Myalgias    Family History  Problem Relation Age of Onset  . Cancer Mother     had uncertain type of cancer    BP 132/82 mmHg  Pulse 76  Temp(Src) 98.3 F (36.8 C) (Oral)  Ht 5\' 4"  (1.626 m)  Wt 164 lb (74.39 kg)  BMI 28.14 kg/m2  SpO2 94%    Review of Systems No weight change    Objective:   Physical Exam VITAL SIGNS:  See vs page GENERAL: no distress Pulses: dorsalis pedis intact bilat.   MSK: no deformity of the feet, except for overlapping toes CV: no leg edema Skin:  no ulcer on the feet.  normal color and temp on the feet. Neuro: sensation is intact to touch on the feet    A1c=6.4% B-12 is low Lab Results  Component Value Date   CHOL 148 10/31/2015   HDL 40.60 10/31/2015   LDLCALC 81 10/31/2015   LDLDIRECT 180.9 06/22/2008   TRIG 135.0 10/31/2015   CHOLHDL 4 10/31/2015      Assessment & Plan:  DM: well-controlled.  Please continue the same medication. B-12 deficiency: therapy limited by noncompliance.  Pt advised to get injections each month. Dyslipidemia: well-controlled.  Please continue the same crestor.   Subjective:   Patient here for  Medicare annual wellness visit and management of other chronic and acute problems.     Risk factors: advanced age    78 of Physicians Providing Medical Care to Patient:  See "snapshot"   Activities of Daily Living: In your present state of health, do you have any difficulty performing the following activities? (lives alone):  Preparing food and eating?: No  Bathing yourself: No  Getting dressed: No  Using the toilet:No  Moving around from place to place: No  In the past year have you fallen or had a near fall?:No    Home Safety: Has smoke detector and wears seat belts. No firearms.  Diet and Exercise  Current exercise habits: pt says good, as limited by frail elderly state Dietary issues discussed: pt reports a healthy diet     Depression Screen  Q1: Over the past two weeks, have you felt down, depressed or hopeless?no  Q2: Over the past two weeks, have you felt little interest or pleasure in doing things? no   The following portions of the patient's history were reviewed and updated as appropriate: allergies, current medications, past family history, past medical history, past social history, past surgical history and problem list.   Review of Systems  Denies hearing loss, and visual loss Objective:   Vision:  Sees opthalmologist Hearing: grossly normal Body mass index:  See vs page Msk: pt easily but slowly performs "get-up-and-go" from a sitting position Cognitive Impairment Assessment: cognition, memory and judgment appear normal.  remembers )/3 at 5 minutes (? effort).  excellent recall.  can easily read and write a sentence.  alert and oriented to self and place.     Assessment:   Medicare wellness utd on preventive parameters    Plan:   During the course of the visit the patient was educated and counseled about appropriate screening and preventive services including:       Fall prevention   Screening mammography  Bone densitometry screening  Diabetes screening  Nutrition counseling   Vaccines / LABS Vaccines are declined  Patient Instructions (the written plan) was given to the patient.

## 2015-10-31 ENCOUNTER — Encounter: Payer: Self-pay | Admitting: Endocrinology

## 2015-10-31 ENCOUNTER — Ambulatory Visit (INDEPENDENT_AMBULATORY_CARE_PROVIDER_SITE_OTHER): Payer: Medicare Other | Admitting: Endocrinology

## 2015-10-31 VITALS — BP 132/82 | HR 76 | Temp 98.3°F | Ht 64.0 in | Wt 164.0 lb

## 2015-10-31 DIAGNOSIS — E119 Type 2 diabetes mellitus without complications: Secondary | ICD-10-CM

## 2015-10-31 DIAGNOSIS — R413 Other amnesia: Secondary | ICD-10-CM | POA: Diagnosis not present

## 2015-10-31 DIAGNOSIS — I10 Essential (primary) hypertension: Secondary | ICD-10-CM

## 2015-10-31 LAB — CBC WITH DIFFERENTIAL/PLATELET
BASOS PCT: 0.5 % (ref 0.0–3.0)
Basophils Absolute: 0 10*3/uL (ref 0.0–0.1)
EOS PCT: 1.2 % (ref 0.0–5.0)
Eosinophils Absolute: 0.1 10*3/uL (ref 0.0–0.7)
HCT: 42.2 % (ref 36.0–46.0)
HEMOGLOBIN: 14 g/dL (ref 12.0–15.0)
LYMPHS ABS: 2.4 10*3/uL (ref 0.7–4.0)
Lymphocytes Relative: 30.8 % (ref 12.0–46.0)
MCHC: 33.2 g/dL (ref 30.0–36.0)
MCV: 93.3 fl (ref 78.0–100.0)
MONO ABS: 0.9 10*3/uL (ref 0.1–1.0)
MONOS PCT: 12 % (ref 3.0–12.0)
NEUTROS PCT: 55.5 % (ref 43.0–77.0)
Neutro Abs: 4.4 10*3/uL (ref 1.4–7.7)
Platelets: 251 10*3/uL (ref 150.0–400.0)
RBC: 4.52 Mil/uL (ref 3.87–5.11)
RDW: 14.1 % (ref 11.5–15.5)
WBC: 7.9 10*3/uL (ref 4.0–10.5)

## 2015-10-31 LAB — TSH: TSH: 1.16 u[IU]/mL (ref 0.35–4.50)

## 2015-10-31 LAB — HEPATIC FUNCTION PANEL
ALK PHOS: 66 U/L (ref 39–117)
ALT: 11 U/L (ref 0–35)
AST: 17 U/L (ref 0–37)
Albumin: 3.9 g/dL (ref 3.5–5.2)
BILIRUBIN TOTAL: 0.7 mg/dL (ref 0.2–1.2)
Bilirubin, Direct: 0.2 mg/dL (ref 0.0–0.3)
Total Protein: 7.3 g/dL (ref 6.0–8.3)

## 2015-10-31 LAB — LIPID PANEL
CHOLESTEROL: 148 mg/dL (ref 0–200)
HDL: 40.6 mg/dL (ref >39.00)
LDL CALC: 81 mg/dL (ref 0–99)
NONHDL: 107.88
Total CHOL/HDL Ratio: 4
Triglycerides: 135 mg/dL (ref 0.0–149.0)
VLDL: 27 mg/dL (ref 0.0–40.0)

## 2015-10-31 LAB — BASIC METABOLIC PANEL
BUN: 10 mg/dL (ref 6–23)
CALCIUM: 9.6 mg/dL (ref 8.4–10.5)
CO2: 30 mEq/L (ref 19–32)
Chloride: 101 mEq/L (ref 96–112)
Creatinine, Ser: 0.81 mg/dL (ref 0.40–1.20)
GFR: 85.42 mL/min (ref >60.00)
GLUCOSE: 133 mg/dL — AB (ref 70–99)
POTASSIUM: 4 meq/L (ref 3.5–5.1)
SODIUM: 137 meq/L (ref 135–145)

## 2015-10-31 LAB — VITAMIN B12: VITAMIN B 12: 172 pg/mL — AB (ref 211–911)

## 2015-10-31 LAB — POCT GLYCOSYLATED HEMOGLOBIN (HGB A1C): HEMOGLOBIN A1C: 6.4

## 2015-10-31 NOTE — Progress Notes (Signed)
we discussed code status.  pt requests DNR 

## 2015-10-31 NOTE — Patient Instructions (Addendum)
please consider these measures for your health:  minimize alcohol.  do not use tobacco products.  have a colonoscopy at least every 10 years from age 80.  Women should have an annual mammogram from age 26.  keep firearms safely stored.  always use seat belts.  have working smoke alarms in your home.  see an eye doctor and dentist regularly.  never drive under the influence of alcohol or drugs (including prescription drugs).   it is critically important to prevent falling down (keep floor areas well-lit, dry, and free of loose objects.  If you have a cane, walker, or wheelchair, you should use it, even for short trips around the house.  Wear flat-soled shoes.  Also, try not to rush) blood tests are requested for you today.  We'll let you know about the results.  Please come back for a follow-up appointment in 6 months (must be after 05/01/16)

## 2015-11-04 ENCOUNTER — Encounter: Payer: Self-pay | Admitting: Endocrinology

## 2016-01-17 DIAGNOSIS — E119 Type 2 diabetes mellitus without complications: Secondary | ICD-10-CM | POA: Diagnosis not present

## 2016-01-17 DIAGNOSIS — H04123 Dry eye syndrome of bilateral lacrimal glands: Secondary | ICD-10-CM | POA: Diagnosis not present

## 2016-01-17 DIAGNOSIS — H25813 Combined forms of age-related cataract, bilateral: Secondary | ICD-10-CM | POA: Diagnosis not present

## 2016-01-17 LAB — HM DIABETES EYE EXAM

## 2016-01-30 ENCOUNTER — Encounter: Payer: Self-pay | Admitting: Endocrinology

## 2016-05-02 ENCOUNTER — Ambulatory Visit: Payer: Medicare Other | Admitting: Endocrinology

## 2016-05-23 ENCOUNTER — Other Ambulatory Visit: Payer: Self-pay | Admitting: Endocrinology

## 2016-05-24 ENCOUNTER — Other Ambulatory Visit: Payer: Self-pay | Admitting: Endocrinology

## 2016-07-19 ENCOUNTER — Telehealth: Payer: Self-pay | Admitting: Endocrinology

## 2016-07-19 NOTE — Telephone Encounter (Signed)
I contacted the patient and advised Dr. Loanne Drilling was her PCP and she would need to see a dentist. Patient was given the number to [facility] to call and see if they could see her because on their website it stated they see patient's with emergencies. She stated she would call because she could not remember the name of the dentist she normally sees.

## 2016-07-19 NOTE — Telephone Encounter (Signed)
Patient called wanted to see Dr Loanne Drilling for her broken tooth, she thinks Dr Loanne Drilling is her Dentist. I tried to explain to her he was an Musician.  She didn't understand.

## 2016-08-17 ENCOUNTER — Other Ambulatory Visit: Payer: Self-pay | Admitting: Endocrinology

## 2016-08-17 NOTE — Telephone Encounter (Signed)
Please refill x 1 Ov is due  

## 2016-09-20 ENCOUNTER — Ambulatory Visit: Payer: Medicare Other | Admitting: Endocrinology

## 2017-01-02 ENCOUNTER — Encounter: Payer: Self-pay | Admitting: Endocrinology

## 2017-01-02 ENCOUNTER — Ambulatory Visit (INDEPENDENT_AMBULATORY_CARE_PROVIDER_SITE_OTHER): Payer: Medicare Other | Admitting: Endocrinology

## 2017-01-02 VITALS — BP 144/94 | HR 85 | Ht 64.0 in | Wt 165.0 lb

## 2017-01-02 DIAGNOSIS — Z0001 Encounter for general adult medical examination with abnormal findings: Secondary | ICD-10-CM | POA: Diagnosis not present

## 2017-01-02 DIAGNOSIS — R413 Other amnesia: Secondary | ICD-10-CM

## 2017-01-02 DIAGNOSIS — I4891 Unspecified atrial fibrillation: Secondary | ICD-10-CM | POA: Diagnosis not present

## 2017-01-02 DIAGNOSIS — E119 Type 2 diabetes mellitus without complications: Secondary | ICD-10-CM | POA: Diagnosis not present

## 2017-01-02 DIAGNOSIS — I10 Essential (primary) hypertension: Secondary | ICD-10-CM

## 2017-01-02 LAB — POCT GLYCOSYLATED HEMOGLOBIN (HGB A1C): Hemoglobin A1C: 7.2

## 2017-01-02 MED ORDER — CELECOXIB 200 MG PO CAPS: 200.0000 mg | ORAL_CAPSULE | Freq: Every day | ORAL | 0 refills | Status: DC

## 2017-01-02 NOTE — Progress Notes (Signed)
we discussed code status.  pt requests DNR 

## 2017-01-02 NOTE — Patient Instructions (Addendum)
please consider these measures for your health:  minimize alcohol.  do not use tobacco products.  have a colonoscopy at least every 10 years from age 81.  Women should have an annual mammogram from age 105.  keep firearms safely stored.  always use seat belts.  have working smoke alarms in your home.  see an eye doctor and dentist regularly.  never drive under the influence of alcohol or drugs (including prescription drugs).   it is critically important to prevent falling down (keep floor areas well-lit, dry, and free of loose objects.  If you have a cane, walker, or wheelchair, you should use it, even for short trips around the house.  Wear flat-soled shoes.  Also, try not to rush) blood tests are requested for you today.  We'll let you know about the results.  Please continue the same medication for blood pressure for now Please come back for a follow-up appointment in 6 months.

## 2017-01-02 NOTE — Progress Notes (Addendum)
Subjective:    Patient ID: Shelby Jennings, female    DOB: 1923-10-14, 83 y.o.   MRN: [id number]  HPI Pt is here for regular wellness examination, and is feeling pretty well in general, and says chronic med probs are stable, except as noted below Past Medical History:  Diagnosis Date  . DIABETES MELLITUS, TYPE II 06/24/2009  . HYPERLIPIDEMIA 05/09/2007  . HYPERTENSION 05/09/2007  . Morbid obesity ([place])   . OSTEOARTHRITIS 05/09/2007    No past surgical history on file.  Social History   Social History  . Marital status: Married    Spouse name: N/A  . Number of children: N/A  . Years of education: N/A   Occupational History  . Retired    Social History Main Topics  . Smoking status: Never Smoker  . Smokeless tobacco: Never Used  . Alcohol use No  . Drug use: No  . Sexual activity: Not on file   Other Topics Concern  . Not on file   Social History Narrative   Widowed    Current Outpatient Prescriptions on File Prior to Visit  Medication Sig Dispense Refill  . cyanocobalamin (,VITAMIN B-12,) 1000 MCG/ML injection Inject 1,000 mcg into the muscle every 30 (thirty) days. Reported on 10/31/2015    . triamcinolone (KENALOG) 0.025 % cream APPLY TOPICALLY AS NEEDED. 80 g 1   No current facility-administered medications on file prior to visit.     Allergies  Allergen Reactions  . Atorvastatin     REACTION: Myalgias    Family History  Problem Relation Age of Onset  . Cancer Mother        had uncertain type of cancer    BP (!) 144/94   Pulse 85   Ht 5\' 4"  (1.626 m)   Wt 165 lb (74.8 kg)   SpO2 97%   BMI 28.32 kg/m     Review of Systems Review of Systems  Constitutional: Negative for fever.  HENT: Negative for nasal conggestion.   Eyes: Negative for eye itching.  Cardiovascular: Negative for chest pain.  Gastrointestinal: Negative for anal bleeding.  Endocrine: Negative for cold intolerance.  Genitourinary: Negative for hematuria.  Musculoskeletal:  Negative for back pain.  Skin: Negative for rash.  Allergic/Immunologic: Negative for environmental allergies.  Neurological: Negative for syncope and numbness.  Hematological: Does not bruise/bleed easily.  Psychiatric/Behavioral: Negative for insomnia.     Objective:   Physical Exam VS: see vs page GEN: no distress HEAD: head: no deformity eyes: no periorbital swelling, no proptosis external nose and ears are normal mouth: no lesion seen NECK: left thyroid mass is again noted CHEST WALL: no deformity LUNGS:  Clear to auscultation ABD: abdomen is soft, nontender.  no hepatosplenomegaly.  not distended.  no hernia.   MUSCULOSKELETAL: muscle bulk and strength are grossly normal.  no obvious joint swelling.  gait is normal and steady EXTEMITIES: no deformity, except for overlapping toes.  no ulcer on the feet.  feet are of normal color and temp.  no edema PULSES: dorsalis pedis intact bilat.  no carotid bruit NEURO:  cn 2-12 grossly intact.   readily moves all 4's.  sensation is intact to touch on the feet SKIN:  Normal texture and temperature.  No rash or suspicious lesion is visible.   NODES:  None palpable at the neck.  PSYCH: alert, well-oriented.  Does not appear anxious nor depressed.  As noted below, he remembers 0/3 at 5 minutes.      Assessment & Plan:  Wellness visit today, with problems stable, except as noted. Memory loss.  Check B-12.    Subjective:   Patient here for Medicare annual wellness visit and management of other chronic and acute problems.     Risk factors: advanced age    70 of Physicians Providing Medical Care to Patient:  See "snapshot"    Activities of Daily Living: In your present state of health, do you have any difficulty performing the following activities (lives alone)?:  Preparing food and eating?: No  Bathing yourself: No  Getting dressed: No  Using the toilet:No  Moving around from place to place: No  In the past year have you  fallen or had a near fall?:No    Home Safety: Has smoke detector and wears seat belts. No firearms.   Diet and Exercise  Current exercise habits: limited by advanced age Dietary issues discussed: pt reports a healthy diet   Depression Screen  Q1: Over the past two weeks, have you felt down, depressed or hopeless? no  Q2: Over the past two weeks, have you felt little interest or pleasure in doing things? no   The following portions of the patient's history were reviewed and updated as appropriate: allergies, current medications, past family history, past medical history, past social history, past surgical history and problem list.   Review of Systems  Denies hearing loss, and visual loss Objective:   Vision:  Sees opthalmologist Dr Katy Fitch.  Hearing: grossly normal Body mass index:  See vs page Msk: pt easily and quickly performs "get-up-and-go" from a sitting position Cognitive Impairment Assessment: cognition and judgment appear normal.  remembers 0/3 at 5 minutes.  excellent recall.  can easily read and write a sentence.  alert and oriented to self and [place].    Assessment:   Medicare wellness utd on preventive parameters    Plan:   During the course of the visit the patient was educated and counseled about appropriate screening and preventive services including:        Fall prevention   Screening mammography is declined  Bone densitometry screening is declined Diabetes screening  Nutrition counseling   Patient Instructions (the written plan) was given to the patient.    SEPARATE EVALUATION FOLLOWS--EACH PROBLEM HERE IS NEW, NOT RESPONDING TO TREATMENT, OR POSES SIGNIFICANT RISK TO THE PATIENT'S HEALTH: HISTORY OF THE PRESENT ILLNESS: AF/F is noted on ecg today.  She has no h/o this.  Denies palpitations. She stopped all meds except for celebrex PAST MEDICAL HISTORY Past Medical History:  Diagnosis Date  . DIABETES MELLITUS, TYPE II 06/24/2009  . HYPERLIPIDEMIA  05/09/2007  . HYPERTENSION 05/09/2007  . Morbid obesity ([place])   . OSTEOARTHRITIS 05/09/2007    No past surgical history on file.  Social History   Social History  . Marital status: Married    Spouse name: N/A  . Number of children: N/A  . Years of education: N/A   Occupational History  . Retired    Social History Main Topics  . Smoking status: Never Smoker  . Smokeless tobacco: Never Used  . Alcohol use No  . Drug use: No  . Sexual activity: Not on file   Other Topics Concern  . Not on file   Social History Narrative   Widowed    Current Outpatient Prescriptions on File Prior to Visit  Medication Sig Dispense Refill  . cyanocobalamin (,VITAMIN B-12,) 1000 MCG/ML injection Inject 1,000 mcg into the muscle every 30 (thirty) days. Reported on 10/31/2015    .  triamcinolone (KENALOG) 0.025 % cream APPLY TOPICALLY AS NEEDED. 80 g 1   No current facility-administered medications on file prior to visit.     Allergies  Allergen Reactions  . Atorvastatin     REACTION: Myalgias    Family History  Problem Relation Age of Onset  . Cancer Mother        had uncertain type of cancer    BP (!) 144/94   Pulse 85   Ht 5\' 4"  (1.626 m)   Wt 165 lb (74.8 kg)   SpO2 97%   BMI 28.32 kg/m   REVIEW OF SYSTEMS: Denies sob PHYSICAL EXAMINATION: VITAL SIGNS:  See vs page GENERAL: no distress HEART:  Regular rate and rhythm without murmurs noted. Normal S1,S2.   LAB/XRAY RESULTS: I personally reviewed electrocardiogram tracing (today): Indication: HTN Impression: AF/F  No MI.  No hypertrophy. Compared to 2014: AF/F is new IMPRESSION: AF/F, new HTN: therapy limited by noncompliance, but BP is only slightly high today.  PLAN:  we discussed.  She declines rx, and she declines ref cardiol.   Correction of AVS: pt is advised she can stay-off BP med for now.

## 2017-01-03 NOTE — Progress Notes (Signed)
Eye exam:  Right eye: 20/50 Left eye: 20/30  Without eye glasses.

## 2017-02-15 ENCOUNTER — Emergency Department (HOSPITAL_COMMUNITY): Payer: Medicare Other

## 2017-02-15 ENCOUNTER — Encounter (HOSPITAL_COMMUNITY)
Admission: EM | Disposition: A | Discharge status: Skilled Nursing Facility | Payer: Self-pay | Source: Home / Self Care | Attending: Neurology

## 2017-02-15 ENCOUNTER — Inpatient Hospital Stay (HOSPITAL_COMMUNITY)
Admission: EM | Admit: 2017-02-15 | Discharge: 2017-02-20 | DRG: 023 | Disposition: A | Discharge status: Skilled Nursing Facility | Payer: Medicare Other | Attending: Neurology | Admitting: Neurology

## 2017-02-15 ENCOUNTER — Emergency Department (HOSPITAL_COMMUNITY): Payer: Medicare Other | Admitting: Certified Registered Nurse Anesthetist

## 2017-02-15 ENCOUNTER — Inpatient Hospital Stay (HOSPITAL_COMMUNITY): Payer: Medicare Other

## 2017-02-15 ENCOUNTER — Encounter (HOSPITAL_COMMUNITY): Payer: Self-pay | Admitting: Radiology

## 2017-02-15 DIAGNOSIS — D649 Anemia, unspecified: Secondary | ICD-10-CM | POA: Diagnosis not present

## 2017-02-15 DIAGNOSIS — I480 Paroxysmal atrial fibrillation: Secondary | ICD-10-CM | POA: Diagnosis not present

## 2017-02-15 DIAGNOSIS — I6300 Cerebral infarction due to thrombosis of unspecified precerebral artery: Secondary | ICD-10-CM | POA: Diagnosis not present

## 2017-02-15 DIAGNOSIS — I69354 Hemiplegia and hemiparesis following cerebral infarction affecting left non-dominant side: Secondary | ICD-10-CM | POA: Diagnosis not present

## 2017-02-15 DIAGNOSIS — L301 Dyshidrosis [pompholyx]: Secondary | ICD-10-CM | POA: Diagnosis not present

## 2017-02-15 DIAGNOSIS — Z809 Family history of malignant neoplasm, unspecified: Secondary | ICD-10-CM | POA: Diagnosis not present

## 2017-02-15 DIAGNOSIS — D320 Benign neoplasm of cerebral meninges: Secondary | ICD-10-CM | POA: Diagnosis not present

## 2017-02-15 DIAGNOSIS — Z79899 Other long term (current) drug therapy: Secondary | ICD-10-CM | POA: Diagnosis not present

## 2017-02-15 DIAGNOSIS — I63511 Cerebral infarction due to unspecified occlusion or stenosis of right middle cerebral artery: Secondary | ICD-10-CM

## 2017-02-15 DIAGNOSIS — R2681 Unsteadiness on feet: Secondary | ICD-10-CM | POA: Diagnosis not present

## 2017-02-15 DIAGNOSIS — R29810 Facial weakness: Secondary | ICD-10-CM | POA: Diagnosis present

## 2017-02-15 DIAGNOSIS — I10 Essential (primary) hypertension: Secondary | ICD-10-CM | POA: Diagnosis present

## 2017-02-15 DIAGNOSIS — M25561 Pain in right knee: Secondary | ICD-10-CM | POA: Diagnosis not present

## 2017-02-15 DIAGNOSIS — Z6825 Body mass index (BMI) 25.0-25.9, adult: Secondary | ICD-10-CM | POA: Diagnosis not present

## 2017-02-15 DIAGNOSIS — M1990 Unspecified osteoarthritis, unspecified site: Secondary | ICD-10-CM | POA: Diagnosis not present

## 2017-02-15 DIAGNOSIS — Z0000 Encounter for general adult medical examination without abnormal findings: Secondary | ICD-10-CM | POA: Diagnosis not present

## 2017-02-15 DIAGNOSIS — Z9119 Patient's noncompliance with other medical treatment and regimen: Secondary | ICD-10-CM | POA: Diagnosis not present

## 2017-02-15 DIAGNOSIS — M6281 Muscle weakness (generalized): Secondary | ICD-10-CM | POA: Diagnosis not present

## 2017-02-15 DIAGNOSIS — N390 Urinary tract infection, site not specified: Secondary | ICD-10-CM | POA: Diagnosis not present

## 2017-02-15 DIAGNOSIS — I639 Cerebral infarction, unspecified: Secondary | ICD-10-CM | POA: Diagnosis not present

## 2017-02-15 DIAGNOSIS — J9601 Acute respiratory failure with hypoxia: Secondary | ICD-10-CM | POA: Diagnosis not present

## 2017-02-15 DIAGNOSIS — E119 Type 2 diabetes mellitus without complications: Secondary | ICD-10-CM | POA: Diagnosis not present

## 2017-02-15 DIAGNOSIS — I63411 Cerebral infarction due to embolism of right middle cerebral artery: Secondary | ICD-10-CM | POA: Diagnosis not present

## 2017-02-15 DIAGNOSIS — G8194 Hemiplegia, unspecified affecting left nondominant side: Secondary | ICD-10-CM | POA: Diagnosis not present

## 2017-02-15 DIAGNOSIS — L299 Pruritus, unspecified: Secondary | ICD-10-CM | POA: Diagnosis not present

## 2017-02-15 DIAGNOSIS — R29724 NIHSS score 24: Secondary | ICD-10-CM | POA: Diagnosis not present

## 2017-02-15 DIAGNOSIS — I6601 Occlusion and stenosis of right middle cerebral artery: Secondary | ICD-10-CM | POA: Diagnosis not present

## 2017-02-15 DIAGNOSIS — E876 Hypokalemia: Secondary | ICD-10-CM | POA: Diagnosis present

## 2017-02-15 DIAGNOSIS — I69391 Dysphagia following cerebral infarction: Secondary | ICD-10-CM

## 2017-02-15 DIAGNOSIS — R4701 Aphasia: Secondary | ICD-10-CM | POA: Diagnosis not present

## 2017-02-15 DIAGNOSIS — E041 Nontoxic single thyroid nodule: Secondary | ICD-10-CM | POA: Diagnosis not present

## 2017-02-15 DIAGNOSIS — E785 Hyperlipidemia, unspecified: Secondary | ICD-10-CM | POA: Diagnosis present

## 2017-02-15 DIAGNOSIS — R413 Other amnesia: Secondary | ICD-10-CM | POA: Diagnosis not present

## 2017-02-15 DIAGNOSIS — I6782 Cerebral ischemia: Secondary | ICD-10-CM | POA: Diagnosis not present

## 2017-02-15 DIAGNOSIS — R29818 Other symptoms and signs involving the nervous system: Secondary | ICD-10-CM | POA: Diagnosis not present

## 2017-02-15 DIAGNOSIS — I6789 Other cerebrovascular disease: Secondary | ICD-10-CM | POA: Diagnosis not present

## 2017-02-15 DIAGNOSIS — R262 Difficulty in walking, not elsewhere classified: Secondary | ICD-10-CM | POA: Diagnosis not present

## 2017-02-15 DIAGNOSIS — J9600 Acute respiratory failure, unspecified whether with hypoxia or hypercapnia: Secondary | ICD-10-CM | POA: Diagnosis not present

## 2017-02-15 HISTORY — PX: RADIOLOGY WITH ANESTHESIA: SHX6223

## 2017-02-15 HISTORY — PX: IR US GUIDE VASC ACCESS RIGHT: IMG2390

## 2017-02-15 HISTORY — PX: IR PERCUTANEOUS ART THROMBECTOMY/INFUSION INTRACRANIAL INC DIAG ANGIO: IMG6087

## 2017-02-15 LAB — CBC
HCT: 36.8 % (ref 36.0–46.0)
HCT: 43.7 % (ref 36.0–46.0)
HEMOGLOBIN: 14.2 g/dL (ref 12.0–15.0)
HEMOGLOBIN: 12 g/dL (ref 12.0–15.0)
MCH: 31.4 pg (ref 26.0–34.0)
MCH: 30.8 pg (ref 26.0–34.0)
MCHC: 32.5 g/dL (ref 30.0–36.0)
MCHC: 32.6 g/dL (ref 30.0–36.0)
MCV: 94.6 fL (ref 78.0–100.0)
MCV: 96.7 fL (ref 78.0–100.0)
PLATELETS: 190 10*3/uL (ref 150–400)
Platelets: 216 10*3/uL (ref 150–400)
RBC: 4.52 MIL/uL (ref 3.87–5.11)
RBC: 3.89 MIL/uL (ref 3.87–5.11)
RDW: 14 % (ref 11.5–15.5)
RDW: 14 % (ref 11.5–15.5)
WBC: 8.1 10*3/uL (ref 4.0–10.5)
WBC: 7.5 10*3/uL (ref 4.0–10.5)

## 2017-02-15 LAB — COMPREHENSIVE METABOLIC PANEL
ALT: 12 U/L — ABNORMAL LOW (ref 14–54)
ANION GAP: 8 (ref 5–15)
AST: 20 U/L (ref 15–41)
Albumin: 3.6 g/dL (ref 3.5–5.0)
Alkaline Phosphatase: 101 U/L (ref 38–126)
BILIRUBIN TOTAL: 0.5 mg/dL (ref 0.3–1.2)
BUN: 11 mg/dL (ref 6–20)
CO2: 21 mmol/L — ABNORMAL LOW (ref 22–32)
Calcium: 8.8 mg/dL — ABNORMAL LOW (ref 8.9–10.3)
Chloride: 110 mmol/L (ref 101–111)
Creatinine, Ser: 0.88 mg/dL (ref 0.44–1.00)
GFR calc Af Amer: >60 mL/min (ref >60)
GFR, EST NON AFRICAN AMERICAN: 56 mL/min — AB (ref >60)
Glucose, Bld: 137 mg/dL — ABNORMAL HIGH (ref 65–99)
POTASSIUM: 4.1 mmol/L (ref 3.5–5.1)
Sodium: 139 mmol/L (ref 135–145)
TOTAL PROTEIN: 6.9 g/dL (ref 6.5–8.1)

## 2017-02-15 LAB — I-STAT TROPONIN, ED: Troponin i, poc: 0 ng/mL (ref 0.00–0.08)

## 2017-02-15 LAB — DIFFERENTIAL
BASOS PCT: 0 %
Basophils Absolute: 0 10*3/uL (ref 0.0–0.1)
EOS PCT: 2 %
Eosinophils Absolute: 0.2 10*3/uL (ref 0.0–0.7)
LYMPHS ABS: 3.7 10*3/uL (ref 0.7–4.0)
Lymphocytes Relative: 46 %
MONO ABS: 0.6 10*3/uL (ref 0.1–1.0)
Monocytes Relative: 8 %
NEUTROS PCT: 44 %
Neutro Abs: 3.6 10*3/uL (ref 1.7–7.7)

## 2017-02-15 LAB — I-STAT CHEM 8, ED
BUN: 15 mg/dL (ref 6–20)
CALCIUM ION: 1 mmol/L — AB (ref 1.15–1.40)
Chloride: 108 mmol/L (ref 101–111)
Creatinine, Ser: 0.8 mg/dL (ref 0.44–1.00)
Glucose, Bld: 139 mg/dL — ABNORMAL HIGH (ref 65–99)
HCT: 44 % (ref 36.0–46.0)
Hemoglobin: 15 g/dL (ref 12.0–15.0)
Potassium: 4.7 mmol/L (ref 3.5–5.1)
SODIUM: 143 mmol/L (ref 135–145)
TCO2: 25 mmol/L (ref 0–100)

## 2017-02-15 LAB — URINALYSIS, ROUTINE W REFLEX MICROSCOPIC
Bilirubin Urine: NEGATIVE
Glucose, UA: NEGATIVE mg/dL
Hgb urine dipstick: NEGATIVE
Ketones, ur: NEGATIVE mg/dL
LEUKOCYTES UA: NEGATIVE
NITRITE: NEGATIVE
PH: 8 (ref 5.0–8.0)
Protein, ur: NEGATIVE mg/dL
SPECIFIC GRAVITY, URINE: 1.014 (ref 1.005–1.030)

## 2017-02-15 LAB — APTT: aPTT: 28 seconds (ref 24–36)

## 2017-02-15 LAB — CBG MONITORING, ED: GLUCOSE-CAPILLARY: 117 mg/dL — AB (ref 65–99)

## 2017-02-15 LAB — PROTIME-INR
INR: 1
Prothrombin Time: 13.2 seconds (ref 11.4–15.2)

## 2017-02-15 SURGERY — RADIOLOGY WITH ANESTHESIA
Anesthesia: General

## 2017-02-15 MED ORDER — LIDOCAINE HCL (CARDIAC) 20 MG/ML IV SOLN
INTRAVENOUS | Status: DC | PRN
  Administered 2017-02-15: 60 mg via INTRAVENOUS

## 2017-02-15 MED ORDER — ALBUMIN HUMAN 5 % IV SOLN
INTRAVENOUS | Status: DC | PRN
  Administered 2017-02-15: 22:00:00 via INTRAVENOUS

## 2017-02-15 MED ORDER — FENTANYL CITRATE (PF) 100 MCG/2ML IJ SOLN: 50.0000 ug | INTRAMUSCULAR | Status: DC | PRN

## 2017-02-15 MED ORDER — ONDANSETRON HCL 4 MG/2ML IJ SOLN: 4.0000 mg | Freq: Four times a day (QID) | INTRAMUSCULAR | Status: DC | PRN

## 2017-02-15 MED ORDER — LABETALOL HCL 5 MG/ML IV SOLN
INTRAVENOUS | Status: DC | PRN
  Administered 2017-02-15: 10 mg via INTRAVENOUS

## 2017-02-15 MED ORDER — IOPAMIDOL (ISOVUE-370) INJECTION 76%
INTRAVENOUS | Status: AC
  Administered 2017-02-15: 50 mL
  Filled 2017-02-15: qty 50

## 2017-02-15 MED ORDER — SUCCINYLCHOLINE CHLORIDE 20 MG/ML IJ SOLN
INTRAMUSCULAR | Status: DC | PRN
  Administered 2017-02-15: 120 mg via INTRAVENOUS

## 2017-02-15 MED ORDER — ACETAMINOPHEN 160 MG/5ML PO SOLN: 650.0000 mg | ORAL | Status: DC | PRN

## 2017-02-15 MED ORDER — IOPAMIDOL (ISOVUE-300) INJECTION 61%
INTRAVENOUS | Status: AC
  Administered 2017-02-15: 75 mL
  Filled 2017-02-15: qty 300

## 2017-02-15 MED ORDER — PHENYLEPHRINE HCL 10 MG/ML IJ SOLN
INTRAVENOUS | Status: DC | PRN
  Administered 2017-02-15: 25 ug/min via INTRAVENOUS

## 2017-02-15 MED ORDER — SODIUM CHLORIDE 0.9 % IV SOLN
INTRAVENOUS | Status: DC
  Administered 2017-02-16 – 2017-02-18 (×4): via INTRAVENOUS

## 2017-02-15 MED ORDER — ROCURONIUM BROMIDE 100 MG/10ML IV SOLN
INTRAVENOUS | Status: DC | PRN
  Administered 2017-02-15: 40 mg via INTRAVENOUS

## 2017-02-15 MED ORDER — FENTANYL CITRATE (PF) 100 MCG/2ML IJ SOLN
50.0000 ug | INTRAMUSCULAR | Status: DC | PRN
  Administered 2017-02-16: 50 ug via INTRAVENOUS
  Filled 2017-02-15: qty 2

## 2017-02-15 MED ORDER — ALTEPLASE (STROKE) FULL DOSE INFUSION
68.0000 mg | Freq: Once | INTRAVENOUS | Status: DC
  Filled 2017-02-15: qty 100

## 2017-02-15 MED ORDER — INSULIN ASPART 100 UNIT/ML ~~LOC~~ SOLN
0.0000 [IU] | SUBCUTANEOUS | Status: DC
  Administered 2017-02-16 (×2): 2 [IU] via SUBCUTANEOUS
  Administered 2017-02-16: 3 [IU] via SUBCUTANEOUS
  Administered 2017-02-16 – 2017-02-17 (×4): 2 [IU] via SUBCUTANEOUS
  Administered 2017-02-18 (×2): 3 [IU] via SUBCUTANEOUS
  Administered 2017-02-18: 2 [IU] via SUBCUTANEOUS
  Administered 2017-02-19: 3 [IU] via SUBCUTANEOUS
  Administered 2017-02-19 – 2017-02-20 (×4): 2 [IU] via SUBCUTANEOUS

## 2017-02-15 MED ORDER — PANTOPRAZOLE SODIUM 40 MG IV SOLR
40.0000 mg | Freq: Every day | INTRAVENOUS | Status: DC
  Administered 2017-02-16 (×2): 40 mg via INTRAVENOUS
  Filled 2017-02-15: qty 40

## 2017-02-15 MED ORDER — CEFAZOLIN SODIUM-DEXTROSE 2-4 GM/100ML-% IV SOLN
INTRAVENOUS | Status: AC
  Filled 2017-02-15: qty 100

## 2017-02-15 MED ORDER — ACETAMINOPHEN 650 MG RE SUPP: 650.0000 mg | RECTAL | Status: DC | PRN

## 2017-02-15 MED ORDER — FENTANYL CITRATE (PF) 100 MCG/2ML IJ SOLN
INTRAMUSCULAR | Status: DC | PRN
  Administered 2017-02-15 (×4): 50 ug via INTRAVENOUS

## 2017-02-15 MED ORDER — PROPOFOL 10 MG/ML IV BOLUS
INTRAVENOUS | Status: DC | PRN
  Administered 2017-02-15: 130 mg via INTRAVENOUS

## 2017-02-15 MED ORDER — LACTATED RINGERS IV SOLN
INTRAVENOUS | Status: DC | PRN
  Administered 2017-02-15: 21:00:00 via INTRAVENOUS

## 2017-02-15 MED ORDER — SENNOSIDES-DOCUSATE SODIUM 8.6-50 MG PO TABS: 1.0000 | ORAL_TABLET | Freq: Every evening | ORAL | Status: DC | PRN

## 2017-02-15 MED ORDER — SODIUM CHLORIDE 0.9 % IV SOLN: 50.0000 mL | Freq: Once | INTRAVENOUS | Status: DC

## 2017-02-15 MED ORDER — ACETAMINOPHEN 325 MG PO TABS: 650.0000 mg | ORAL_TABLET | ORAL | Status: DC | PRN

## 2017-02-15 MED ORDER — STROKE: EARLY STAGES OF RECOVERY BOOK
Freq: Once | Status: AC
  Administered 2017-02-15: 1
  Filled 2017-02-15: qty 1

## 2017-02-15 MED ORDER — CEFAZOLIN SODIUM-DEXTROSE 2-3 GM-% IV SOLR
INTRAVENOUS | Status: DC | PRN
  Administered 2017-02-15: 2 g via INTRAVENOUS

## 2017-02-15 MED ORDER — HEPARIN SODIUM (PORCINE) 5000 UNIT/ML IJ SOLN
5000.0000 [IU] | Freq: Three times a day (TID) | INTRAMUSCULAR | Status: DC
  Administered 2017-02-16 – 2017-02-19 (×10): 5000 [IU] via SUBCUTANEOUS
  Filled 2017-02-15 (×12): qty 1

## 2017-02-15 MED ORDER — PROPOFOL 500 MG/50ML IV EMUL
INTRAVENOUS | Status: DC | PRN
  Administered 2017-02-15: 50 ug/kg/min via INTRAVENOUS

## 2017-02-15 MED ORDER — CLEVIDIPINE BUTYRATE 0.5 MG/ML IV EMUL
0.0000 mg/h | INTRAVENOUS | Status: DC
  Administered 2017-02-16 (×2): 4 mg/h via INTRAVENOUS
  Administered 2017-02-16: 1 mg/h via INTRAVENOUS
  Administered 2017-02-17: 5 mg/h via INTRAVENOUS
  Administered 2017-02-17 (×2): 6 mg/h via INTRAVENOUS
  Filled 2017-02-15 (×7): qty 50

## 2017-02-15 MED ORDER — PROPOFOL 1000 MG/100ML IV EMUL
5.0000 ug/kg/min | INTRAVENOUS | Status: DC
  Administered 2017-02-16: 35 ug/kg/min via INTRAVENOUS
  Filled 2017-02-15: qty 100

## 2017-02-15 NOTE — ED Notes (Signed)
RN spoke with Dr. Earleen Newport.  Dr. Armida Sans back to ED and states pt is going to IR.

## 2017-02-15 NOTE — Anesthesia Procedure Notes (Signed)
Arterial Line Insertion Start/End7/01/2017 9:30 PM, 02/15/2017 9:33 PM Performed by: Lance Coon, CRNA  Preanesthetic checklist: patient identified, IV checked, site marked, risks and benefits discussed, monitors and equipment checked, pre-op evaluation and timeout performed Lidocaine 1% used for infiltration Left, radial was placed Catheter size: 20 G Hand hygiene performed  and maximum sterile barriers used   Attempts: 1 Following insertion, Biopatch and dressing applied. Patient tolerated the procedure well with no immediate complications.

## 2017-02-15 NOTE — Anesthesia Postprocedure Evaluation (Signed)
Anesthesia Post Note  Patient: Shelby Jennings  Procedure(s) Performed: Procedure(s) (LRB): RADIOLOGY WITH ANESTHESIA (N/A)     Patient location during evaluation: SICU Anesthesia Type: General Level of consciousness: sedated Pain management: pain level controlled Vital Signs Assessment: post-procedure vital signs reviewed and stable Respiratory status: patient remains intubated per anesthesia plan Cardiovascular status: stable Anesthetic complications: no    Last Vitals:  Vitals:   02/15/17 2100 02/15/17 2115  BP: (!) 180/90 (!) 174/84  Pulse: 67 72  Resp: 20 20  Temp:      Last Pain:  Vitals:   02/15/17 2033  TempSrc: Oral                 Effie Berkshire

## 2017-02-15 NOTE — ED Provider Notes (Addendum)
[facility] DEPT Provider Note   CSN: [id number] Arrival date & time: 02/15/17  2010   An emergency department physician performed an initial assessment on this suspected stroke patient at 2012.  History   Chief Complaint Chief Complaint  Patient presents with  . Code Stroke    HPI Shelby Jennings is a 83 y.o. female.  Patient brought in by EMS. Acute onset at 87 of left facial droop left sided weakness and difficulty speaking. Patient lives at a senior center community is not assisted living is not a nursing home.      Past Medical History:  Diagnosis Date  . DIABETES MELLITUS, TYPE II 06/24/2009  . HYPERLIPIDEMIA 05/09/2007  . HYPERTENSION 05/09/2007  . Morbid obesity ([place])   . OSTEOARTHRITIS 05/09/2007    Patient Active Problem List   Diagnosis Date Noted  . Stroke ([place]) 02/15/2017  . Memory loss 06/11/2014  . Thyroid mass 03/23/2011  . Encounter for long-term (current) use of other medications 12/21/2010  . Routine general medical examination at a health care facility 12/21/2010  . KNEE PAIN, RIGHT 12/29/2009  . Diabetes ([place]) 06/24/2009  . PERS HX NONCOMPLIANCE W/MED TX PRS HAZARDS HLTH 06/24/2009  . UNSPECIFIED PRURITIC DISORDER 06/24/2007  . Dyslipidemia 05/09/2007  . Essential hypertension 05/09/2007  . DYSHIDROSIS 05/09/2007  . OSTEOARTHRITIS 05/09/2007    History reviewed. No pertinent surgical history.  OB History    No data available       Home Medications    Prior to Admission medications   Medication Sig Start Date End Date Taking? Authorizing Provider  celecoxib (CELEBREX) 200 MG capsule Take 1 capsule (200 mg total) by mouth daily. 01/02/17   Renato Shin, MD  cyanocobalamin (,VITAMIN B-12,) 1000 MCG/ML injection Inject 1,000 mcg into the muscle every 30 (thirty) days. Reported on 10/31/2015    [provider]  triamcinolone (KENALOG) 0.025 % cream APPLY TOPICALLY AS NEEDED. Patient not taking: Reported on 02/15/2017 06/29/15    Renato Shin, MD    Family History Family History  Problem Relation Age of Onset  . Cancer Mother        had uncertain type of cancer    Social History Social History  Substance Use Topics  . Smoking status: Never Smoker  . Smokeless tobacco: Never Used  . Alcohol use No     Allergies   Atorvastatin   Review of Systems Review of Systems  Unable to perform ROS: Patient nonverbal     Physical Exam Updated Vital Signs BP (!) 174/84 (BP Location: Left Arm)   Pulse 72   Temp 98.5 F (36.9 C) (Oral)   Resp 20   Ht 1.702 m (5\' 7" )   Wt 76.1 kg (167 lb 12.3 oz)   SpO2 98%   BMI 26.28 kg/m   Physical Exam  Constitutional: She appears well-developed and well-nourished. No distress.  HENT:  Head: Normocephalic.  Mouth/Throat: Oropharynx is clear and moist.  Left facial droop. Difficulty speaking.  Eyes: Conjunctivae are normal. Pupils are equal, round, and reactive to light.  Gaze to the right side. Seems to be ignoring left side.  Cardiovascular: Normal rate, regular rhythm and normal heart sounds.   Pulmonary/Chest: Effort normal and breath sounds normal. No respiratory distress.  Abdominal: Soft. Bowel sounds are normal. There is no tenderness.  Musculoskeletal: She exhibits no deformity.  Weakness to the arm leg. Left facial droop.  Neurological: She is alert.  Patient with left facial droop. Eye gaze is to the right. Patient was  significant weakness to left side.  Skin: Skin is warm.  Nursing note and vitals reviewed.    ED Treatments / Results  Labs (all labs ordered are listed, but only abnormal results are displayed) Labs Reviewed  COMPREHENSIVE METABOLIC PANEL - Abnormal; Notable for the following:       Result Value   CO2 21 (*)    Glucose, Bld 137 (*)    Calcium 8.8 (*)    ALT 12 (*)    GFR calc non Af Amer 56 (*)    All other components within normal limits  URINALYSIS, ROUTINE W REFLEX MICROSCOPIC - Abnormal; Notable for the following:     Color, Urine COLORLESS (*)    All other components within normal limits  CBG MONITORING, ED - Abnormal; Notable for the following:    Glucose-Capillary 117 (*)    All other components within normal limits  I-STAT CHEM 8, ED - Abnormal; Notable for the following:    Glucose, Bld 139 (*)    Calcium, Ion 1.00 (*)    All other components within normal limits  PROTIME-INR  APTT  CBC  DIFFERENTIAL  I-STAT TROPOININ, ED    EKG  EKG Interpretation None     ED ECG REPORT   Date: 02/15/2017  Rate: 79  Rhythm: normal sinus rhythm  QRS Axis: normal  Intervals: QT prolonged  ST/T Wave abnormalities: normal  Conduction Disutrbances:none  Narrative Interpretation:   Old EKG Reviewed: none available  I have personally reviewed the EKG tracing and agree with the computerized printout as noted.   Radiology Ct Angio Head W Or Wo Contrast  Result Date: 02/15/2017 CLINICAL DATA:  Code stroke. EXAM: CT ANGIOGRAPHY HEAD AND NECK TECHNIQUE: Multidetector CT imaging of the head and neck was performed using the standard protocol during bolus administration of intravenous contrast. Multiplanar CT image reconstructions and MIPs were obtained to evaluate the vascular anatomy. Carotid stenosis measurements (when applicable) are obtained utilizing NASCET criteria, using the distal internal carotid diameter as the denominator. CONTRAST:  Does not available, reference EMR COMPARISON:  Noncontrast head CT from earlier today. FINDINGS: CTA NECK FINDINGS Aortic arch: Atherosclerotic plaque.  Three vessel branching. Right carotid system: Atherosclerotic plaque at the common carotid bifurcation and proximal ICA. Mild widening of the distal right ICA. No flow limiting stenosis or ulceration. No acute dissection suspected. Left carotid system: Atherosclerosis mainly about the common carotid bifurcation and before the skullbase. The proximal ICA appears diffusely narrow and mildly irregular. Accounting for shelf-like  plaque in the posterior bulb, no ulceration is suspected. Vertebral arteries: No proximal subclavian or brachiocephalic stenosis. There is plaque at the left vertebral origin with mild narrowing. Both vertebral arteries are patent to the dura. Mild undulation in widening at the V3 segments, no acute convincing dissection. Skeleton: No acute or aggressive finding. Multilevel cervical facet arthropathy. Other neck: Solid 26 mm nodule in the left thyroid. No invasive features or adenopathy. Upper chest: Unusual appearing pulmonary vein in the left upper lobe, only partly seen. Neighboring pulmonary arteries are symmetrically dense to the right, no suspected fistula. Review of the MIP images confirms the above findings CTA HEAD FINDINGS Anterior circulation: Atherosclerotic plaque on the carotid siphons. There is a right M1 cut off with poor early collaterals. Hypoplastic left A1 segment. Severe multifocal narrowing of left M2 branches, atheromatous appearing. No related M1 narrowing as it passes superior to the presumed meningioma. Posterior circulation: Symmetric vertebral arteries. Plaque on the vertebral arteries without flow limiting stenosis. Atheromatous  irregularity and narrowing of the left more than right PCAs, advanced. No major branch occlusion or aneurysm. Venous sinuses: Limited venous opacification. Anatomic variants: None significant. Delayed phase: Not performed in the emergent setting. These results were called by telephone at the time of interpretation on 02/15/2017 at 8:44 pm to Dr. Dorian Pod , who verbally acknowledged these results. Review of the MIP images confirms the above findings IMPRESSION: 1. Emergent large vessel occlusion with right M1 clot. 2. Relatively mild atherosclerosis in the neck without flow limiting stenosis. Bilateral V3 segments and ICAs are somewhat irregular, question FMD. 3. Advanced intracranial atherosclerosis with high-grade left M2 and bilateral P2 segment stenoses.  4. Meningiomas as described on prior noncontrast study. 5. 26 mm left thyroid mass. Electronically Signed   By: Monte Fantasia M.D.   On: 02/15/2017 20:58   Ct Angio Neck W Or Wo Contrast  Result Date: 02/15/2017 CLINICAL DATA:  Code stroke. EXAM: CT ANGIOGRAPHY HEAD AND NECK TECHNIQUE: Multidetector CT imaging of the head and neck was performed using the standard protocol during bolus administration of intravenous contrast. Multiplanar CT image reconstructions and MIPs were obtained to evaluate the vascular anatomy. Carotid stenosis measurements (when applicable) are obtained utilizing NASCET criteria, using the distal internal carotid diameter as the denominator. CONTRAST:  Does not available, reference EMR COMPARISON:  Noncontrast head CT from earlier today. FINDINGS: CTA NECK FINDINGS Aortic arch: Atherosclerotic plaque.  Three vessel branching. Right carotid system: Atherosclerotic plaque at the common carotid bifurcation and proximal ICA. Mild widening of the distal right ICA. No flow limiting stenosis or ulceration. No acute dissection suspected. Left carotid system: Atherosclerosis mainly about the common carotid bifurcation and before the skullbase. The proximal ICA appears diffusely narrow and mildly irregular. Accounting for shelf-like plaque in the posterior bulb, no ulceration is suspected. Vertebral arteries: No proximal subclavian or brachiocephalic stenosis. There is plaque at the left vertebral origin with mild narrowing. Both vertebral arteries are patent to the dura. Mild undulation in widening at the V3 segments, no acute convincing dissection. Skeleton: No acute or aggressive finding. Multilevel cervical facet arthropathy. Other neck: Solid 26 mm nodule in the left thyroid. No invasive features or adenopathy. Upper chest: Unusual appearing pulmonary vein in the left upper lobe, only partly seen. Neighboring pulmonary arteries are symmetrically dense to the right, no suspected fistula. Review  of the MIP images confirms the above findings CTA HEAD FINDINGS Anterior circulation: Atherosclerotic plaque on the carotid siphons. There is a right M1 cut off with poor early collaterals. Hypoplastic left A1 segment. Severe multifocal narrowing of left M2 branches, atheromatous appearing. No related M1 narrowing as it passes superior to the presumed meningioma. Posterior circulation: Symmetric vertebral arteries. Plaque on the vertebral arteries without flow limiting stenosis. Atheromatous irregularity and narrowing of the left more than right PCAs, advanced. No major branch occlusion or aneurysm. Venous sinuses: Limited venous opacification. Anatomic variants: None significant. Delayed phase: Not performed in the emergent setting. These results were called by telephone at the time of interpretation on 02/15/2017 at 8:44 pm to Dr. Dorian Pod , who verbally acknowledged these results. Review of the MIP images confirms the above findings IMPRESSION: 1. Emergent large vessel occlusion with right M1 clot. 2. Relatively mild atherosclerosis in the neck without flow limiting stenosis. Bilateral V3 segments and ICAs are somewhat irregular, question FMD. 3. Advanced intracranial atherosclerosis with high-grade left M2 and bilateral P2 segment stenoses. 4. Meningiomas as described on prior noncontrast study. 5. 26 mm left thyroid mass.  Electronically Signed   By: Monte Fantasia M.D.   On: 02/15/2017 20:58   Ct Head Code Stroke W/o Cm  Result Date: 02/15/2017 CLINICAL DATA:  Code stroke.  Left-sided weakness.  Slurred speech. EXAM: CT HEAD WITHOUT CONTRAST TECHNIQUE: Contiguous axial images were obtained from the base of the skull through the vertex without intravenous contrast. COMPARISON:  None. FINDINGS: Brain: No acute hemorrhage or visible infarct. Extra-axial mass in the left middle cranial fossa measuring 3 cm, with patchy internal mineralization. This is contiguous with the anterior clinoid process and most  consistent with a meningioma. Based on the calcification the mass is solid and should not represent an aneurysm. There is likely a sessile dural-based mass in the upper left posterior fossa measuring 8 x 17 mm. No parenchymal edema neighboring the masses. Vascular: No hyperdense vessel.  Atherosclerotic calcification. Skull: No acute or aggressive finding.  Hyperostosis interna. Sinuses/Orbits: Gaze to the right Other: These results were called by telephone at the time of interpretation on 02/15/2017 at 8:30 pm to Dr. Aram Beecham, who verbally acknowledged these results. ASPECTS [facility] Stroke Program Early CT Score) - Ganglionic level infarction (caudate, lentiform nuclei, internal capsule, insula, M1-M3 cortex): 7 - Supraganglionic infarction (M4-M6 cortex): 3 Total score (0-10 with 10 being normal): 10 IMPRESSION: 1. No acute finding. ASPECTS is 10. 2. 3 cm extra-axial mass along the left anterior clinoid, partly calcified and most consistent with a meningioma. 3. Probable additional 17 x 8 mm left posterior fossa meningioma. Electronically Signed   By: Monte Fantasia M.D.   On: 02/15/2017 20:37    Procedures Procedures (including critical care time)  CRITICAL CARE Performed by: Fredia Sorrow Total critical care time: 30 minutes Critical care time was exclusive of separately billable procedures and treating other patients. Critical care was necessary to treat or prevent imminent or life-threatening deterioration. Critical care was time spent personally by me on the following activities: development of treatment plan with patient and/or surrogate as well as nursing, discussions with consultants, evaluation of patient's response to treatment, examination of patient, obtaining history from patient or surrogate, ordering and performing treatments and interventions, ordering and review of laboratory studies, ordering and review of radiographic studies, pulse oximetry and re-evaluation of patient's  condition.   Medications Ordered in ED Medications  iopamidol (ISOVUE-300) 61 % injection (not administered)  ceFAZolin (ANCEF) 2-4 GM/100ML-% IVPB (not administered)  iopamidol (ISOVUE-370) 76 % injection (50 mLs  Contrast Given 02/15/17 2027)     Initial Impression / Assessment and Plan / ED Course  I have reviewed the triage vital signs and the nursing notes.  Pertinent labs & imaging results that were available during my care of the patient were reviewed by me and considered in my medical decision making (see chart for details).     CT consistent with right MCA. Patient candidate for intervention. Also showing concerns for a frontal lobe mass. Which could be neoplastic. Not a candidate for TPA. Patient seen by me along with the stroke team. Patient will go to interventional radiology be admitted by the neuro hospitalist.  Patient arrived as a code stroke. That stroke criteria. Code stroke orders initiated.  Final Clinical Impressions(s) / ED Diagnoses   Final diagnoses:  Stroke [facility])    New Prescriptions New Prescriptions   No medications on file     Fredia Sorrow, MD 02/15/17 2206    Fredia Sorrow, MD 02/15/17 2208

## 2017-02-15 NOTE — Anesthesia Preprocedure Evaluation (Signed)
Anesthesia Evaluation  Patient identified by MRN, date of birth, ID band Patient confused    Reviewed: Unable to perform ROS - Chart review onlyPreop documentation limited or incomplete due to emergent nature of procedure.  Airway   TM Distance: >3 FB     Dental  (+) Poor Dentition, Missing,    Pulmonary    breath sounds clear to auscultation       Cardiovascular hypertension,  Rhythm:Regular Rate:Normal     Neuro/Psych    GI/Hepatic   Endo/Other  diabetes  Renal/GU      Musculoskeletal   Abdominal   Peds  Hematology   Anesthesia Other Findings   Reproductive/Obstetrics                             Anesthesia Physical Anesthesia Plan  ASA: III and emergent  Anesthesia Plan: General   Post-op Pain Management:    Induction: Intravenous, Rapid sequence and Cricoid pressure planned  PONV Risk Score and Plan: 3 and Ondansetron, Dexamethasone, Propofol, Midazolam and Treatment may vary due to age or medical condition  Airway Management Planned: Oral ETT  Additional Equipment: Arterial line  Intra-op Plan:   Post-operative Plan: Post-operative intubation/ventilation  Informed Consent: I have reviewed the patients History and Physical, chart, labs and discussed the procedure including the risks, benefits and alternatives for the proposed anesthesia with the patient or authorized representative who has indicated his/her understanding and acceptance.     Plan Discussed with: CRNA  Anesthesia Plan Comments:         Anesthesia Quick Evaluation

## 2017-02-15 NOTE — Consult Note (Signed)
Name: Shelby Jennings MRN: [id number] DOB: 1924/07/17    ADMISSION DATE:  02/15/2017 CONSULTATION DATE:  02/15/2017  REFERRING MD :  Dorian Pod, MD  CHIEF COMPLAINT:  CVA  BRIEF PATIENT DESCRIPTION: 83 -year-old female with HLD, HTN  and atrial fibrillation presenting with acute stroke. CT head revealed R M1 with poor early collaterals and patient is now s/p mechanical thrombectomy.   SIGNIFICANT EVENTS  02/15/2017: Mechanical thrombectomy  STUDIES:  CTA head 7/6: Emergent large vessel occlusion with right M1 clot, Advanced intracranial atherosclerosis with high-grade left M2 and bilateral P2 segment stenoses   HISTORY OF PRESENT ILLNESS:  83 yo F with a past medical history significant for hyperlipidemia, hypertension and type 2 diabetes and atrial fibrillation(unclear if on anticoagulation) who was last known well around 7 PM. Neighbor reportedly saw patient slide out of a chair and also developed left-sided paralysis, slurred speech and right-sided gaze. Code stroke was called and patient was brought to [facility] ED via EMS. CT head showed no acute abnormality and CTA head revealed R M1 occlusion.   PAST MEDICAL HISTORY :   has a past medical history of DIABETES MELLITUS, TYPE II (06/24/2009); HYPERLIPIDEMIA (05/09/2007); HYPERTENSION (05/09/2007); Morbid obesity ([place]); and OSTEOARTHRITIS (05/09/2007).  has no past surgical history on file. Prior to Admission medications   Medication Sig Start Date End Date Taking? Authorizing Provider  celecoxib (CELEBREX) 200 MG capsule Take 1 capsule (200 mg total) by mouth daily. 01/02/17   Renato Shin, MD  cyanocobalamin (,VITAMIN B-12,) 1000 MCG/ML injection Inject 1,000 mcg into the muscle every 30 (thirty) days. Reported on 10/31/2015    [provider]  triamcinolone (KENALOG) 0.025 % cream APPLY TOPICALLY AS NEEDED. 06/29/15   Renato Shin, MD   Allergies  Allergen Reactions  . Atorvastatin Other (See Comments)    Myalgia      FAMILY HISTORY:  family history includes Cancer in her mother. SOCIAL HISTORY:  reports that she has never smoked. She has never used smokeless tobacco. She reports that she does not drink alcohol or use drugs.  REVIEW OF SYSTEMS:   Patient intubated and unable to discuss ROS  SUBJECTIVE:   VITAL SIGNS: Temp:  [98.5 F (36.9 C)] 98.5 F (36.9 C) (07/06 2033) Pulse Rate:  [67-81] 72 (07/06 2115) Resp:  [20-22] 20 (07/06 2115) BP: (154-190)/(84-117) 174/84 (07/06 2115) SpO2:  [98 %-100 %] 98 % (07/06 2115) Weight:  [167 lb 12.3 oz (76.1 kg)] 167 lb 12.3 oz (76.1 kg) (07/06 2054)  PHYSICAL EXAMINATION: General:  83yo F intubated Neuro:  Sedated, eyes closed, does not respond to verbal or painful stimuli HEENT:  AT/[place], PERRL Cardiovascular:  RRR, s1/s2 present, no MRG Lungs:  CTABL Abdomen:  Soft, NTND Musculoskeletal:  No gross deformities Extremities: no edema Skin: warm and dry   Recent Labs Lab 02/15/17 2016 02/15/17 2020  NA 139 143  K 4.1 4.7  CL 110 108  CO2 21*  --   BUN 11 15  CREATININE 0.88 0.80  GLUCOSE 137* 139*    Recent Labs Lab 02/15/17 2016 02/15/17 2020  HGB 14.2 15.0  HCT 43.7 44.0  WBC 8.1  --   PLT 216  --    Ct Angio Head W Or Wo Contrast  Result Date: 02/15/2017 CLINICAL DATA:  Code stroke. EXAM: CT ANGIOGRAPHY HEAD AND NECK TECHNIQUE: Multidetector CT imaging of the head and neck was performed using the standard protocol during bolus administration of intravenous contrast. Multiplanar CT image reconstructions and MIPs  were obtained to evaluate the vascular anatomy. Carotid stenosis measurements (when applicable) are obtained utilizing NASCET criteria, using the distal internal carotid diameter as the denominator. CONTRAST:  Does not available, reference EMR COMPARISON:  Noncontrast head CT from earlier today. FINDINGS: CTA NECK FINDINGS Aortic arch: Atherosclerotic plaque.  Three vessel branching. Right carotid system: Atherosclerotic  plaque at the common carotid bifurcation and proximal ICA. Mild widening of the distal right ICA. No flow limiting stenosis or ulceration. No acute dissection suspected. Left carotid system: Atherosclerosis mainly about the common carotid bifurcation and before the skullbase. The proximal ICA appears diffusely narrow and mildly irregular. Accounting for shelf-like plaque in the posterior bulb, no ulceration is suspected. Vertebral arteries: No proximal subclavian or brachiocephalic stenosis. There is plaque at the left vertebral origin with mild narrowing. Both vertebral arteries are patent to the dura. Mild undulation in widening at the V3 segments, no acute convincing dissection. Skeleton: No acute or aggressive finding. Multilevel cervical facet arthropathy. Other neck: Solid 26 mm nodule in the left thyroid. No invasive features or adenopathy. Upper chest: Unusual appearing pulmonary vein in the left upper lobe, only partly seen. Neighboring pulmonary arteries are symmetrically dense to the right, no suspected fistula. Review of the MIP images confirms the above findings CTA HEAD FINDINGS Anterior circulation: Atherosclerotic plaque on the carotid siphons. There is a right M1 cut off with poor early collaterals. Hypoplastic left A1 segment. Severe multifocal narrowing of left M2 branches, atheromatous appearing. No related M1 narrowing as it passes superior to the presumed meningioma. Posterior circulation: Symmetric vertebral arteries. Plaque on the vertebral arteries without flow limiting stenosis. Atheromatous irregularity and narrowing of the left more than right PCAs, advanced. No major branch occlusion or aneurysm. Venous sinuses: Limited venous opacification. Anatomic variants: None significant. Delayed phase: Not performed in the emergent setting. These results were called by telephone at the time of interpretation on 02/15/2017 at 8:44 pm to Dr. Dorian Pod , who verbally acknowledged these results.  Review of the MIP images confirms the above findings IMPRESSION: 1. Emergent large vessel occlusion with right M1 clot. 2. Relatively mild atherosclerosis in the neck without flow limiting stenosis. Bilateral V3 segments and ICAs are somewhat irregular, question FMD. 3. Advanced intracranial atherosclerosis with high-grade left M2 and bilateral P2 segment stenoses. 4. Meningiomas as described on prior noncontrast study. 5. 26 mm left thyroid mass. Electronically Signed   By: Monte Fantasia M.D.   On: 02/15/2017 20:58   Ct Angio Neck W Or Wo Contrast  Result Date: 02/15/2017 CLINICAL DATA:  Code stroke. EXAM: CT ANGIOGRAPHY HEAD AND NECK TECHNIQUE: Multidetector CT imaging of the head and neck was performed using the standard protocol during bolus administration of intravenous contrast. Multiplanar CT image reconstructions and MIPs were obtained to evaluate the vascular anatomy. Carotid stenosis measurements (when applicable) are obtained utilizing NASCET criteria, using the distal internal carotid diameter as the denominator. CONTRAST:  Does not available, reference EMR COMPARISON:  Noncontrast head CT from earlier today. FINDINGS: CTA NECK FINDINGS Aortic arch: Atherosclerotic plaque.  Three vessel branching. Right carotid system: Atherosclerotic plaque at the common carotid bifurcation and proximal ICA. Mild widening of the distal right ICA. No flow limiting stenosis or ulceration. No acute dissection suspected. Left carotid system: Atherosclerosis mainly about the common carotid bifurcation and before the skullbase. The proximal ICA appears diffusely narrow and mildly irregular. Accounting for shelf-like plaque in the posterior bulb, no ulceration is suspected. Vertebral arteries: No proximal subclavian or brachiocephalic stenosis. There  is plaque at the left vertebral origin with mild narrowing. Both vertebral arteries are patent to the dura. Mild undulation in widening at the V3 segments, no acute  convincing dissection. Skeleton: No acute or aggressive finding. Multilevel cervical facet arthropathy. Other neck: Solid 26 mm nodule in the left thyroid. No invasive features or adenopathy. Upper chest: Unusual appearing pulmonary vein in the left upper lobe, only partly seen. Neighboring pulmonary arteries are symmetrically dense to the right, no suspected fistula. Review of the MIP images confirms the above findings CTA HEAD FINDINGS Anterior circulation: Atherosclerotic plaque on the carotid siphons. There is a right M1 cut off with poor early collaterals. Hypoplastic left A1 segment. Severe multifocal narrowing of left M2 branches, atheromatous appearing. No related M1 narrowing as it passes superior to the presumed meningioma. Posterior circulation: Symmetric vertebral arteries. Plaque on the vertebral arteries without flow limiting stenosis. Atheromatous irregularity and narrowing of the left more than right PCAs, advanced. No major branch occlusion or aneurysm. Venous sinuses: Limited venous opacification. Anatomic variants: None significant. Delayed phase: Not performed in the emergent setting. These results were called by telephone at the time of interpretation on 02/15/2017 at 8:44 pm to Dr. Dorian Pod , who verbally acknowledged these results. Review of the MIP images confirms the above findings IMPRESSION: 1. Emergent large vessel occlusion with right M1 clot. 2. Relatively mild atherosclerosis in the neck without flow limiting stenosis. Bilateral V3 segments and ICAs are somewhat irregular, question FMD. 3. Advanced intracranial atherosclerosis with high-grade left M2 and bilateral P2 segment stenoses. 4. Meningiomas as described on prior noncontrast study. 5. 26 mm left thyroid mass. Electronically Signed   By: Monte Fantasia M.D.   On: 02/15/2017 20:58   Ct Head Code Stroke W/o Cm  Result Date: 02/15/2017 CLINICAL DATA:  Code stroke.  Left-sided weakness.  Slurred speech. EXAM: CT HEAD WITHOUT  CONTRAST TECHNIQUE: Contiguous axial images were obtained from the base of the skull through the vertex without intravenous contrast. COMPARISON:  None. FINDINGS: Brain: No acute hemorrhage or visible infarct. Extra-axial mass in the left middle cranial fossa measuring 3 cm, with patchy internal mineralization. This is contiguous with the anterior clinoid process and most consistent with a meningioma. Based on the calcification the mass is solid and should not represent an aneurysm. There is likely a sessile dural-based mass in the upper left posterior fossa measuring 8 x 17 mm. No parenchymal edema neighboring the masses. Vascular: No hyperdense vessel.  Atherosclerotic calcification. Skull: No acute or aggressive finding.  Hyperostosis interna. Sinuses/Orbits: Gaze to the right Other: These results were called by telephone at the time of interpretation on 02/15/2017 at 8:30 pm to Dr. Aram Beecham, who verbally acknowledged these results. ASPECTS [facility] Stroke Program Early CT Score) - Ganglionic level infarction (caudate, lentiform nuclei, internal capsule, insula, M1-M3 cortex): 7 - Supraganglionic infarction (M4-M6 cortex): 3 Total score (0-10 with 10 being normal): 10 IMPRESSION: 1. No acute finding. ASPECTS is 10. 2. 3 cm extra-axial mass along the left anterior clinoid, partly calcified and most consistent with a meningioma. 3. Probable additional 17 x 8 mm left posterior fossa meningioma. Electronically Signed   By: Monte Fantasia M.D.   On: 02/15/2017 20:37    ASSESSMENT / PLAN:  PULMONARY A: ET tube in place for airway protection P:   -full vent support -titrate for o2 sats >92%  CARDIOVASCULAR A:  Hx HTN, HLD P:  - holding home BP medication - cleviprex for goal BP 120-140  GASTROINTESTINAL A:  NPO P:   -GI ppx with protonix IV -early feeding   HEMATOLOGIC A:   No active bleeding. Hgb stable P:  -Holding ppx for 24hrs - follow CBC  ENDOCRINE A:   Hx T2DM P:   -SSI   -CBGs q4hrs  NEUROLOGIC A:   CVA M1 occlusion s/p mechanical thrombectomy  P:   RASS goal: 0-1 - propofol continuous - fentanyl 50-141mcg q2hrs PRN - repeat head CT - q1hr neuro vascular checks - echo, carotid doppler, telemetry -holding ppx therapy x24hrs - MRI brain w/o contrast - risk stratification labs  Daniel L. Rosalyn Gess, [place] Resident PGY-2 02/16/2017 12:03 AM    Pulmonary and Critical Care Medicine [facility] Pager: [phone]  02/15/2017, 11:07 PM

## 2017-02-15 NOTE — ED Notes (Signed)
Neurohospitalist talking with pt regarding thrombectomy risk.

## 2017-02-15 NOTE — Anesthesia Procedure Notes (Signed)
Procedure Name: Intubation Date/Time: 02/15/2017 9:38 PM Performed by: Oletta Lamas Pre-anesthesia Checklist: Patient identified, Emergency Drugs available, Suction available and Patient being monitored Patient Re-evaluated:Patient Re-evaluated prior to inductionOxygen Delivery Method: Circle System Utilized Preoxygenation: Pre-oxygenation with 100% oxygen Intubation Type: IV induction, Cricoid Pressure applied and Rapid sequence Laryngoscope Size: Mac and 3 Grade View: Grade I Tube type: Subglottic suction tube Tube size: 7.5 mm Number of attempts: 1 Airway Equipment and Method: Stylet and Oral airway Placement Confirmation: ETT inserted through vocal cords under direct vision,  positive ETCO2 and breath sounds checked- equal and bilateral Secured at: 20 cm Tube secured with: Tape Dental Injury: Teeth and Oropharynx as per pre-operative assessment

## 2017-02-15 NOTE — Consult Note (Addendum)
Referring Physician: ED    Chief Complaint: code stroke, L hemiparesis with R gaze aphasia, L face weakness and dysarthria.  HPI:                                                                                                                                         Shelby Jennings is an 83 y.o. female with a past medical history that is significant for HTN, HLD, DM, and reportedly Afib but unclear if taking anticoagulation, who at baseline is quite functional and pretty independent but  brought to the ED as a Code Stroke by [facility] from [facility].  Neighbor saw pt slide out of chair at 1900.  Pt with sudden onset of L sided paralysis, slurred speech, R sided gaze.  Code Stroke called. No family present at bedside and unsuccessful attempt to contact her brother or other family member to obtain further information. She is currently awake, very dysarthric, mildly confused, with dense L Hemiparesis, L face droop, and R gaze preference. STAT CT head showed no acute abnormality, ASPECTS 10, but evidence of a large 2. 3 cm extra-axial mass along the left anterior clinoid, partly calcified and most consistent with a meningioma.as well as probable additional 17 x 8 mm left posterior fossa meningioma . CTA head revealed R M1 occlusion with poor early collaterals.   Date last known well: 02/15/17 Time last known well: 7 pm tPA Given: no, question of a fib on the chart and unknown if patient taking anticoagulation. NIHSS: 24 MRS: 0  Past Medical History:  Diagnosis Date  . DIABETES MELLITUS, TYPE II 06/24/2009  . HYPERLIPIDEMIA 05/09/2007  . HYPERTENSION 05/09/2007  . Morbid obesity ([place])   . OSTEOARTHRITIS 05/09/2007    History reviewed. No pertinent surgical history.  Family History  Problem Relation Age of Onset  . Cancer Mother        had uncertain type of cancer   Social History:  reports that she has never smoked. She has never used smokeless tobacco. She reports that she does not drink alcohol or  use drugs.  Allergies:  Allergies  Allergen Reactions  . Atorvastatin Other (See Comments)    Myalgia     Medications:  I have reviewed the patient's current medications. Prior to Admission:  (Not in a hospital admission) Scheduled: . iopamidol        ROS:                                                                                                                                       History obtained from chart review and the patient  General ROS: negative for - chills, fatigue, fever, night sweats, weight gain or weight loss Psychological ROS: negative for - behavioral disorder, hallucinations, memory difficulties, mood swings or suicidal ideation Ophthalmic ROS: negative for - blurry vision, double vision, eye pain or loss of vision ENT ROS: negative for - epistaxis, nasal discharge, oral lesions, sore throat, tinnitus or vertigo Allergy and Immunology ROS: negative for - hives or itchy/watery eyes Hematological and Lymphatic ROS: negative for - bleeding problems, bruising or swollen lymph nodes Endocrine ROS: negative for - galactorrhea, hair pattern changes, polydipsia/polyuria or temperature intolerance Respiratory ROS: negative for - cough, hemoptysis, shortness of breath or wheezing Cardiovascular ROS: negative for - chest pain, dyspnea on exertion, edema or irregular heartbeat Gastrointestinal ROS: negative for - abdominal pain, diarrhea, hematemesis, nausea/vomiting or stool incontinence Genito-Urinary ROS: negative for - dysuria, hematuria, incontinence or urinary frequency/urgency Musculoskeletal ROS: negative for - joint swelling or muscular weakness Neurological ROS: as noted in HPI Dermatological ROS: negative for rash and skin lesion changes  General: Physical exam:  Constitutional: well developed, pleasant female in no apparent  distress. Eyes: no jaundice or exophthalmos.  Head: normocephalic. Neck: supple, no bruits, no JVD. Cardiac: no murmurs. Lungs: clear. Abdomen: soft, no tender, no mass. Extremities: no edema, clubbing, or cyanosis.  Skin: no rash  Neurologic Examination:                                                                                                      Blood pressure (!) 190/100, pulse 77, temperature 98.5 F (36.9 C), temperature source Oral, resp. rate (!) 22, height '5\' 7"'$  (1.702 m), weight 76.1 kg (167 lb 12.3 oz), SpO2 100 %.  Mental Status: Alert, awake, disoriented to place and year, thought content appropriate. Comprehension, naming,and repetition are appropriate. Very dysarthric  Able to follow 3 step commands without difficulty. Cranial Nerves: II:  Visual fields grossly impaired in the L, pupils equal, round, reactive to light and accommodation III,IV, VI: ptosis not present, R gaze preference. V,VII: smile asymmetric due to L face weakness, facial light touch sensation normal bilaterally  VIII: hearing normal bilaterally IX,X: uvula rises symmetrically XI: bilateral shoulder shrug XII: midline tongue extension without atrophy or fasciculations Motor: L hemiplegia Tone decreased in the L side Sensory: Pinprick and light touch intact impaired in the left Deep Tendon Reflexes:  Right: Upper Extremity   Left: Upper extremity   biceps (C-5 to C-6) 2/4   biceps (C-5 to C-6) 2/4 tricep (C7) 2/4    triceps (C7) 2/4 Brachioradialis (C6) 2/4  Brachioradialis (C6) 2/4  Lower Extremity Lower Extremity  quadriceps (L-2 to L-4) 2/4   quadriceps (L-2 to L-4) 2/4 Achilles (S1) 2/4   Achilles (S1) 2/4  Plantars: Right: downgoing   Left: upgoing Cerebellar: normal finger-to-nose on the R, unable to test on the L due to weakness Gait:  unable to test due to acute clinical status.    Results for orders placed or performed during the hospital encounter of 02/15/17 (from the  past 48 hour(s))  CBG monitoring, ED     Status: Abnormal   Collection Time: 02/15/17  8:13 PM  Result Value Ref Range   Glucose-Capillary 117 (H) 65 - 99 mg/dL  Protime-INR     Status: None   Collection Time: 02/15/17  8:16 PM  Result Value Ref Range   Prothrombin Time 13.2 11.4 - 15.2 seconds   INR 1.00   APTT     Status: None   Collection Time: 02/15/17  8:16 PM  Result Value Ref Range   aPTT 28 24 - 36 seconds  CBC     Status: None   Collection Time: 02/15/17  8:16 PM  Result Value Ref Range   WBC 8.1 4.0 - 10.5 K/uL   RBC 4.52 3.87 - 5.11 MIL/uL   Hemoglobin 14.2 12.0 - 15.0 g/dL   HCT 43.7 36.0 - 46.0 %   MCV 96.7 78.0 - 100.0 fL   MCH 31.4 26.0 - 34.0 pg   MCHC 32.5 30.0 - 36.0 g/dL   RDW 14.0 11.5 - 15.5 %   Platelets 216 150 - 400 K/uL  Differential     Status: None   Collection Time: 02/15/17  8:16 PM  Result Value Ref Range   Neutrophils Relative % 44 %   Lymphocytes Relative 46 %   Monocytes Relative 8 %   Eosinophils Relative 2 %   Basophils Relative 0 %   Neutro Abs 3.6 1.7 - 7.7 K/uL   Lymphs Abs 3.7 0.7 - 4.0 K/uL   Monocytes Absolute 0.6 0.1 - 1.0 K/uL   Eosinophils Absolute 0.2 0.0 - 0.7 K/uL   Basophils Absolute 0.0 0.0 - 0.1 K/uL  Comprehensive metabolic panel     Status: Abnormal   Collection Time: 02/15/17  8:16 PM  Result Value Ref Range   Sodium 139 135 - 145 mmol/L   Potassium 4.1 3.5 - 5.1 mmol/L   Chloride 110 101 - 111 mmol/L   CO2 21 (L) 22 - 32 mmol/L   Glucose, Bld 137 (H) 65 - 99 mg/dL   BUN 11 6 - 20 mg/dL   Creatinine, Ser 0.88 0.44 - 1.00 mg/dL   Calcium 8.8 (L) 8.9 - 10.3 mg/dL   Total Protein 6.9 6.5 - 8.1 g/dL   Albumin 3.6 3.5 - 5.0 g/dL   AST 20 15 - 41 U/L   ALT 12 (L) 14 - 54 U/L   Alkaline Phosphatase 101 38 - 126 U/L   Total Bilirubin 0.5 0.3 - 1.2 mg/dL   GFR calc non Af Amer 56 (L) >  60 mL/min   GFR calc Af Amer >60 >60 mL/min    Comment: (NOTE) The eGFR has been calculated using the CKD EPI equation. This  calculation has not been validated in all clinical situations. eGFR's persistently <60 mL/min signify possible Chronic Kidney Disease.    Anion gap 8 5 - 15  I-stat troponin, ED     Status: None   Collection Time: 02/15/17  8:18 PM  Result Value Ref Range   Troponin i, poc 0.00 0.00 - 0.08 ng/mL   Comment 3            Comment: Due to the release kinetics of cTnI, a negative result within the first hours of the onset of symptoms does not rule out myocardial infarction with certainty. If myocardial infarction is still suspected, repeat the test at appropriate intervals.   I-Stat Chem 8, ED     Status: Abnormal   Collection Time: 02/15/17  8:20 PM  Result Value Ref Range   Sodium 143 135 - 145 mmol/L   Potassium 4.7 3.5 - 5.1 mmol/L   Chloride 108 101 - 111 mmol/L   BUN 15 6 - 20 mg/dL   Creatinine, Ser 0.80 0.44 - 1.00 mg/dL   Glucose, Bld 139 (H) 65 - 99 mg/dL   Calcium, Ion 1.00 (L) 1.15 - 1.40 mmol/L   TCO2 25 0 - 100 mmol/L   Hemoglobin 15.0 12.0 - 15.0 g/dL   HCT 44.0 36.0 - 46.0 %   Ct Angio Head W Or Wo Contrast  Result Date: 02/15/2017 CLINICAL DATA:  Code stroke. EXAM: CT ANGIOGRAPHY HEAD AND NECK TECHNIQUE: Multidetector CT imaging of the head and neck was performed using the standard protocol during bolus administration of intravenous contrast. Multiplanar CT image reconstructions and MIPs were obtained to evaluate the vascular anatomy. Carotid stenosis measurements (when applicable) are obtained utilizing NASCET criteria, using the distal internal carotid diameter as the denominator. CONTRAST:  Does not available, reference EMR COMPARISON:  Noncontrast head CT from earlier today. FINDINGS: CTA NECK FINDINGS Aortic arch: Atherosclerotic plaque.  Three vessel branching. Right carotid system: Atherosclerotic plaque at the common carotid bifurcation and proximal ICA. Mild widening of the distal right ICA. No flow limiting stenosis or ulceration. No acute dissection suspected.  Left carotid system: Atherosclerosis mainly about the common carotid bifurcation and before the skullbase. The proximal ICA appears diffusely narrow and mildly irregular. Accounting for shelf-like plaque in the posterior bulb, no ulceration is suspected. Vertebral arteries: No proximal subclavian or brachiocephalic stenosis. There is plaque at the left vertebral origin with mild narrowing. Both vertebral arteries are patent to the dura. Mild undulation in widening at the V3 segments, no acute convincing dissection. Skeleton: No acute or aggressive finding. Multilevel cervical facet arthropathy. Other neck: Solid 26 mm nodule in the left thyroid. No invasive features or adenopathy. Upper chest: Unusual appearing pulmonary vein in the left upper lobe, only partly seen. Neighboring pulmonary arteries are symmetrically dense to the right, no suspected fistula. Review of the MIP images confirms the above findings CTA HEAD FINDINGS Anterior circulation: Atherosclerotic plaque on the carotid siphons. There is a right M1 cut off with poor early collaterals. Hypoplastic left A1 segment. Severe multifocal narrowing of left M2 branches, atheromatous appearing. No related M1 narrowing as it passes superior to the presumed meningioma. Posterior circulation: Symmetric vertebral arteries. Plaque on the vertebral arteries without flow limiting stenosis. Atheromatous irregularity and narrowing of the left more than right PCAs, advanced. No major branch occlusion or  aneurysm. Venous sinuses: Limited venous opacification. Anatomic variants: None significant. Delayed phase: Not performed in the emergent setting. These results were called by telephone at the time of interpretation on 02/15/2017 at 8:44 pm to Dr. Dorian Pod , who verbally acknowledged these results. Review of the MIP images confirms the above findings IMPRESSION: 1. Emergent large vessel occlusion with right M1 clot. 2. Relatively mild atherosclerosis in the neck  without flow limiting stenosis. Bilateral V3 segments and ICAs are somewhat irregular, question FMD. 3. Advanced intracranial atherosclerosis with high-grade left M2 and bilateral P2 segment stenoses. 4. Meningiomas as described on prior noncontrast study. 5. 26 mm left thyroid mass. Electronically Signed   By: Monte Fantasia M.D.   On: 02/15/2017 20:58   Ct Angio Neck W Or Wo Contrast  Result Date: 02/15/2017 CLINICAL DATA:  Code stroke. EXAM: CT ANGIOGRAPHY HEAD AND NECK TECHNIQUE: Multidetector CT imaging of the head and neck was performed using the standard protocol during bolus administration of intravenous contrast. Multiplanar CT image reconstructions and MIPs were obtained to evaluate the vascular anatomy. Carotid stenosis measurements (when applicable) are obtained utilizing NASCET criteria, using the distal internal carotid diameter as the denominator. CONTRAST:  Does not available, reference EMR COMPARISON:  Noncontrast head CT from earlier today. FINDINGS: CTA NECK FINDINGS Aortic arch: Atherosclerotic plaque.  Three vessel branching. Right carotid system: Atherosclerotic plaque at the common carotid bifurcation and proximal ICA. Mild widening of the distal right ICA. No flow limiting stenosis or ulceration. No acute dissection suspected. Left carotid system: Atherosclerosis mainly about the common carotid bifurcation and before the skullbase. The proximal ICA appears diffusely narrow and mildly irregular. Accounting for shelf-like plaque in the posterior bulb, no ulceration is suspected. Vertebral arteries: No proximal subclavian or brachiocephalic stenosis. There is plaque at the left vertebral origin with mild narrowing. Both vertebral arteries are patent to the dura. Mild undulation in widening at the V3 segments, no acute convincing dissection. Skeleton: No acute or aggressive finding. Multilevel cervical facet arthropathy. Other neck: Solid 26 mm nodule in the left thyroid. No invasive features  or adenopathy. Upper chest: Unusual appearing pulmonary vein in the left upper lobe, only partly seen. Neighboring pulmonary arteries are symmetrically dense to the right, no suspected fistula. Review of the MIP images confirms the above findings CTA HEAD FINDINGS Anterior circulation: Atherosclerotic plaque on the carotid siphons. There is a right M1 cut off with poor early collaterals. Hypoplastic left A1 segment. Severe multifocal narrowing of left M2 branches, atheromatous appearing. No related M1 narrowing as it passes superior to the presumed meningioma. Posterior circulation: Symmetric vertebral arteries. Plaque on the vertebral arteries without flow limiting stenosis. Atheromatous irregularity and narrowing of the left more than right PCAs, advanced. No major branch occlusion or aneurysm. Venous sinuses: Limited venous opacification. Anatomic variants: None significant. Delayed phase: Not performed in the emergent setting. These results were called by telephone at the time of interpretation on 02/15/2017 at 8:44 pm to Dr. Dorian Pod , who verbally acknowledged these results. Review of the MIP images confirms the above findings IMPRESSION: 1. Emergent large vessel occlusion with right M1 clot. 2. Relatively mild atherosclerosis in the neck without flow limiting stenosis. Bilateral V3 segments and ICAs are somewhat irregular, question FMD. 3. Advanced intracranial atherosclerosis with high-grade left M2 and bilateral P2 segment stenoses. 4. Meningiomas as described on prior noncontrast study. 5. 26 mm left thyroid mass. Electronically Signed   By: Monte Fantasia M.D.   On: 02/15/2017 20:58  Ct Head Code Stroke W/o Cm  Result Date: 02/15/2017 CLINICAL DATA:  Code stroke.  Left-sided weakness.  Slurred speech. EXAM: CT HEAD WITHOUT CONTRAST TECHNIQUE: Contiguous axial images were obtained from the base of the skull through the vertex without intravenous contrast. COMPARISON:  None. FINDINGS: Brain: No  acute hemorrhage or visible infarct. Extra-axial mass in the left middle cranial fossa measuring 3 cm, with patchy internal mineralization. This is contiguous with the anterior clinoid process and most consistent with a meningioma. Based on the calcification the mass is solid and should not represent an aneurysm. There is likely a sessile dural-based mass in the upper left posterior fossa measuring 8 x 17 mm. No parenchymal edema neighboring the masses. Vascular: No hyperdense vessel.  Atherosclerotic calcification. Skull: No acute or aggressive finding.  Hyperostosis interna. Sinuses/Orbits: Gaze to the right Other: These results were called by telephone at the time of interpretation on 02/15/2017 at 8:30 pm to Dr. Aram Beecham, who verbally acknowledged these results. ASPECTS [facility] Stroke Program Early CT Score) - Ganglionic level infarction (caudate, lentiform nuclei, internal capsule, insula, M1-M3 cortex): 7 - Supraganglionic infarction (M4-M6 cortex): 3 Total score (0-10 with 10 being normal): 10 IMPRESSION: 1. No acute finding. ASPECTS is 10. 2. 3 cm extra-axial mass along the left anterior clinoid, partly calcified and most consistent with a meningioma. 3. Probable additional 17 x 8 mm left posterior fossa meningioma. Electronically Signed   By: Monte Fantasia M.D.   On: 02/15/2017 20:37     Assessment: 83 y.o. female with HTN, HLD, DM, question of a fib unclear if taking anticoagulation, brought in as a code stroke with findings on neuro-exam consistent with a R MCA syndrome NIHSS 24, CT head ASPECTS 10, with CTA brain revealing R MI occlusion with poor early collaterales. Of note, large 2. 3 cm extra-axial mass along the left anterior clinoid, partly calcified and most consistent with a meningioma.as well as probable additional 17 x 8 mm left posterior fossa meningioma Unable to contact family on the phone and no clear information at this time regarding the presumed history of a fib and whether or no she  is on anticoagulation, hence I abstained of treating with iv thrombolysis. Discussed with IR attending and patient is a proper candidate for endovascular intervention (NIHSS 24, modified Rankin 0, ASPECTS 10) and thus patient was emergently taken to the IR suite.  Stroke Risk Factors - atrial fibrillation, diabetes mellitus, hyperlipidemia and hypertension  Plan: 1. HgbA1c, fasting lipid panel 2. MRI, MRA  of the brain without contrast 3. Echocardiogram 4. Carotid dopplers 5. Prophylactic therapy-antiplatelet therapy as per post thrombectomy protocol. 6. Risk factor modification 7. Telemetry monitoring 8. Frequent neuro checks 9. PT/OT SLP  Dorian Pod, MD [facility]  9:23 pm

## 2017-02-15 NOTE — Progress Notes (Signed)
NeuroInterventional Radiology Pre-Procedure Note  History:  83 yo female with a witnessed onset of acute stroke at her place of residence at [place].    Shelby Jennings was witnessed by her neighbor to "slide from a chair" with sudden left sided paralysis ans slurred speech, with right gaze.  She was brought directly to [facility] ED for evaluation.    She has been seen by Dr. Armida Sans of the Neurology Stroke team.    She has no known spouse or children, and just a brother.  He is not with her.    She has a history of good function at baseline, able to care for herself and do all of her daily tasks.    I have spoken with the patient and she prefers to undergo the procedure.  She confirms that she is fully functional at baseline.  She has no known POA.   Baseline mRS: 0. NIHSS:  . >15  CT ASPECTS: 10 CTA:   .Right M1 occlusion CTP:   Not performed.  Given the timeframe, is not indicated   Given the patient's symptoms, imaging findings, baseline function, I believe she is an appropriate candidate for mechanical thrombectomy.    The risks and benefits of the procedure were discussed with the patient, with specific risks including: bleeding, infection, arterial injury/dissection, contrast reaction, kidney injury, need for further procedure/surgery, neurologic deficit, 10-15% risk of intracranial hemorrhage, cardiopulmonary collapse, death. All questions were answered.  The patient/family would like to proceed with attempt at thrombectomy.   Specific for her, she is elderly, but has a good baseline function and no history of stroke.  Given the early time window and the good ASPECTS appearance on the CT head, I think her risk is no greater than that of any of her peers.  I think there is benefit with revascularization.  I would not waste time with a CTP.   Plan for cerebral angiogram and attempt at mechanical thrombectomy.   Signed,  Dulcy Fanny. Earleen Newport, DO

## 2017-02-15 NOTE — Transfer of Care (Signed)
Immediate Anesthesia Transfer of Care Note  Patient: Shelby Jennings  Procedure(s) Performed: Procedure(s): RADIOLOGY WITH ANESTHESIA (N/A)  Patient Location: ICU  Anesthesia Type:General  Level of Consciousness: sedated and Patient remains intubated per anesthesia plan  Airway & Oxygen Therapy: Patient remains intubated per anesthesia plan and Patient placed on Ventilator (see vital sign flow sheet for setting)  Post-op Assessment: Report given to RN and Post -op Vital signs reviewed and stable  Post vital signs: Reviewed and stable  Last Vitals:  Vitals:   02/15/17 2100 02/15/17 2115  BP: (!) 180/90 (!) 174/84  Pulse: 67 72  Resp: 20 20  Temp:      Last Pain:  Vitals:   02/15/17 2033  TempSrc: Oral         Complications: No apparent anesthesia complications

## 2017-02-15 NOTE — ED Notes (Signed)
Pt back to ED from CT.

## 2017-02-15 NOTE — ED Notes (Addendum)
RN has attempted to contact pt's brother, emergency contact listed in chart- no answer.  PT states she hasn't seen her brother in awhile and that he doesn't come to visit her.  States she doesn't have any other family.  Attempted to call [facility] without success.  Pt is alert and answering questions regarding if she would accept blood transfusion.  Pt states they drew blood when she arrived and wanted to know what they were checking for.  Pt asking why she slid out of chair at her home.  RN called Dr. Armida Sans back to bedside and informed him that pt is talking coherently.  Dr. Armida Sans explained CT results and informed pt of thrombectomy procedure.  Pt states, "You are the doctor and you know what is best for me."  Dr. Armida Sans explained multiple times that there were risk and pt agrees to have procedure.  RN questioned foley catheter placement and Dr. Armida Sans is going to IR to talk with Dr. Earleen Newport first.

## 2017-02-15 NOTE — ED Triage Notes (Addendum)
Pt to ED as a Code Stroke by [facility] from [facility].  Neighbor saw pt slide out of chair at 1900.  Pt with sudden onset of L sided paralysis, slurred speech, R sided gaze.  Code Stroke called pta.

## 2017-02-15 NOTE — Progress Notes (Signed)
Shelby Jennings is a 83 yo female coming from [facility] via [place] as a code stroke. Pt's neighbor witnessed pt slide out of chair at 1900. No injuries from sliding out of chair. Pt with acute onset of Left side paralysis, slurred speech, forced Right sided gaze, and Left facial droop. Pt has a hx of HTN and DM. Next of kin is listed as brother, unable to reach him via telephone. Pt transported to IR, admitted to 4N under neuro. NIH 24, LKW 1900, CBG 117

## 2017-02-15 NOTE — Procedures (Signed)
Neuro-Interventional Radiology Post Cerebral Angiogram Procedure Note  History:  83 yo female with acute right MCA syndrome, with right M1 ELVO.    Baseline mRS:  0 NIHSS:   24 Last Known Well:  7pm ASPECTS:   10 Anesthesia    GETA Skin Puncture:   9:45pm [place] Date & Time: 10:21pm IA tPA:    No IA Medication:  No Proximal or Distal:  Proximal Right M1 occlusion Post TICI Score:  TICI 3 Skin Puncture to Final: 36 minutes Device:   4 x 40 solitaire  Procedure:   US guided right CFA access. Right cervicocerebral angiogram.  Mechanical thrombectomy of right M1 occlusion Closure of RCFA access with Exoseal  Findings:  Tortuous cervical vasculature Mild athero of right bifurcation.  No significant stenosis Tortuous ICA.  Occlusion of proximal M1.  After thrombectomy, TICI 3 flow MCA territory.    Complications: None  EBL: No significant  Recommendations:  - To neuro ICU  -STAT CT head now - SBP target is 120-140 SP complete revascularization - Right leg straight x 6 hours to decrease chance of bleeding.  If oozing, then manual pressure x 30 minutes and pressure dressing.  May consider remaining intubated overnight while leg is straight for 6 hours  Signed,  Dulcy Fanny. Earleen Newport, DO

## 2017-02-15 NOTE — ED Notes (Signed)
PT to IR with RN, RR RN, and EMT.

## 2017-02-16 ENCOUNTER — Inpatient Hospital Stay (HOSPITAL_COMMUNITY): Payer: Medicare Other

## 2017-02-16 ENCOUNTER — Encounter (HOSPITAL_COMMUNITY): Payer: Self-pay | Admitting: Interventional Radiology

## 2017-02-16 DIAGNOSIS — I63511 Cerebral infarction due to unspecified occlusion or stenosis of right middle cerebral artery: Secondary | ICD-10-CM

## 2017-02-16 DIAGNOSIS — J9601 Acute respiratory failure with hypoxia: Secondary | ICD-10-CM

## 2017-02-16 LAB — RENAL FUNCTION PANEL
ALBUMIN: 2.9 g/dL — AB (ref 3.5–5.0)
Anion gap: 6 (ref 5–15)
BUN: 8 mg/dL (ref 6–20)
CO2: 22 mmol/L (ref 22–32)
CREATININE: 0.79 mg/dL (ref 0.44–1.00)
Calcium: 8.1 mg/dL — ABNORMAL LOW (ref 8.9–10.3)
Chloride: 112 mmol/L — ABNORMAL HIGH (ref 101–111)
GFR calc Af Amer: >60 mL/min (ref >60)
Glucose, Bld: 135 mg/dL — ABNORMAL HIGH (ref 65–99)
PHOSPHORUS: 2.7 mg/dL (ref 2.5–4.6)
POTASSIUM: 3.8 mmol/L (ref 3.5–5.1)
Sodium: 140 mmol/L (ref 135–145)

## 2017-02-16 LAB — CBC WITH DIFFERENTIAL/PLATELET
BASOS ABS: 0 10*3/uL (ref 0.0–0.1)
Basophils Relative: 0 %
EOS PCT: 1 %
Eosinophils Absolute: 0.1 10*3/uL (ref 0.0–0.7)
HCT: 36.1 % (ref 36.0–46.0)
Hemoglobin: 11.8 g/dL — ABNORMAL LOW (ref 12.0–15.0)
Lymphocytes Relative: 24 %
Lymphs Abs: 1.8 10*3/uL (ref 0.7–4.0)
MCH: 30.6 pg (ref 26.0–34.0)
MCHC: 32.7 g/dL (ref 30.0–36.0)
MCV: 93.8 fL (ref 78.0–100.0)
Monocytes Absolute: 0.7 10*3/uL (ref 0.1–1.0)
Monocytes Relative: 9 %
Neutro Abs: 5.2 10*3/uL (ref 1.7–7.7)
Neutrophils Relative %: 66 %
PLATELETS: 191 10*3/uL (ref 150–400)
RBC: 3.85 MIL/uL — AB (ref 3.87–5.11)
RDW: 13.6 % (ref 11.5–15.5)
WBC: 7.8 10*3/uL (ref 4.0–10.5)

## 2017-02-16 LAB — GLUCOSE, CAPILLARY
GLUCOSE-CAPILLARY: 128 mg/dL — AB (ref 65–99)
GLUCOSE-CAPILLARY: 149 mg/dL — AB (ref 65–99)
Glucose-Capillary: 115 mg/dL — ABNORMAL HIGH (ref 65–99)
Glucose-Capillary: 116 mg/dL — ABNORMAL HIGH (ref 65–99)
Glucose-Capillary: 135 mg/dL — ABNORMAL HIGH (ref 65–99)
Glucose-Capillary: 144 mg/dL — ABNORMAL HIGH (ref 65–99)
Glucose-Capillary: 162 mg/dL — ABNORMAL HIGH (ref 65–99)

## 2017-02-16 LAB — LIPID PANEL
CHOL/HDL RATIO: 5.7 ratio
CHOLESTEROL: 164 mg/dL (ref 0–200)
HDL: 29 mg/dL — AB (ref >40)
LDL Cholesterol: 110 mg/dL — ABNORMAL HIGH (ref 0–99)
Triglycerides: 127 mg/dL (ref <150)
VLDL: 25 mg/dL (ref 0–40)

## 2017-02-16 LAB — TRIGLYCERIDES: TRIGLYCERIDES: 129 mg/dL (ref <150)

## 2017-02-16 LAB — MRSA PCR SCREENING: MRSA BY PCR: NEGATIVE

## 2017-02-16 LAB — MAGNESIUM: Magnesium: 1.7 mg/dL (ref 1.7–2.4)

## 2017-02-16 MED ORDER — CHLORHEXIDINE GLUCONATE 0.12% ORAL RINSE (MEDLINE KIT)
15.0000 mL | Freq: Two times a day (BID) | OROMUCOSAL | Status: DC
  Administered 2017-02-16 – 2017-02-17 (×4): 15 mL via OROMUCOSAL

## 2017-02-16 MED ORDER — ORAL CARE MOUTH RINSE
15.0000 mL | OROMUCOSAL | Status: DC
  Administered 2017-02-16 (×7): 15 mL via OROMUCOSAL

## 2017-02-16 MED ORDER — MAGNESIUM SULFATE 2 GM/50ML IV SOLN
2.0000 g | Freq: Once | INTRAVENOUS | Status: AC
  Administered 2017-02-16: 2 g via INTRAVENOUS
  Filled 2017-02-16: qty 50

## 2017-02-16 MED ORDER — ASPIRIN 300 MG RE SUPP
300.0000 mg | Freq: Every day | RECTAL | Status: DC
  Administered 2017-02-16 – 2017-02-17 (×2): 300 mg via RECTAL
  Filled 2017-02-16 (×2): qty 1

## 2017-02-16 MED ORDER — CHLORHEXIDINE GLUCONATE 0.12 % MT SOLN
OROMUCOSAL | Status: AC
  Filled 2017-02-16: qty 15

## 2017-02-16 NOTE — Progress Notes (Signed)
[facility] Attending Note  ADMISSION DATE:  02/15/2017  CONSULTATION DATE:  02/15/2017  REFERRING MD :  Dorian Pod, MD  CHIEF COMPLAINT:  CVA  Presenting HPI:  83 y.o. female with known history of hyperlipidemia, hypertension, and atrial fibrillation presenting with an acute CVA. CT imaging showed a right sided occlusion. Patient underwent mechanical thrombectomy and remained intubated post procedure. PCCM was consulted for medical and ventilator management.   Subjective:  No other acute events overnight. Patient endorsing some abdominal discomfort. Otherwise denies any chest pain or difficulty breathing.  Review of Systems:  Unable to obtain given intubation.   Vent Mode: PSV;CPAP FiO2 (%):  [40 %] 40 % Set Rate:  [14 bmp] 14 bmp Vt Set:  [500 mL] 500 mL PEEP:  [5 cmH20] 5 cmH20 Pressure Support:  [5 cmH20-10 cmH20] 5 cmH20 Plateau Pressure:  [16 cmH20] 16 cmH20  Temp:  [95 F (35 C)-99.7 F (37.6 C)] 98.6 F (37 C) (07/07 0850) Pulse Rate:  [48-89] 81 (07/07 0850) Resp:  [12-22] 12 (07/07 0850) BP: (107-190)/(58-117) 115/58 (07/07 0700) SpO2:  [98 %-100 %] 100 % (07/07 0929) Arterial Line BP: (101-163)/(48-77) 101/67 (07/07 0700) FiO2 (%):  [40 %] 40 % (07/07 0929) Weight:  [73.7 kg (162 lb 7.7 oz)-76.1 kg (167 lb 12.3 oz)] 73.7 kg (162 lb 7.7 oz) (07/07 0000)  General:  No family at bedside. Intubated. Awake.  Integument:  Warm & dry. No rash on exposed skin. Right femoral sheath site clean and dry. HEENT:  Moist mucus memebranes. No scleral icterus. Endotracheal tube in place. Neurological:  Pupils symmetric. Following commands. Moving all 4 extremities equally. Nods to questions. Musculoskeletal:  No joint effusion or erythema appreciated. Symmetric muscle bulk. Pulmonary:  Symmetric chest wall rise on ventilator. Clear breath sounds bilaterally. Normal work of breathing on pressure support trial. Cardiovascular:  Regular rate. No appreciable  JVD. Normal S1 & S2. Abdomen:  Soft. Minimally protuberant. Normoactive bowel sounds.  LINES/TUBES: OETT 7.5 7/6 >>> RADIAL ART LINE 7/6 >>> Foley >>> PIV  CBC Latest Ref Rng & Units 02/16/2017 02/15/2017 02/15/2017  WBC 4.0 - 10.5 K/uL 7.8 7.5 -  Hemoglobin 12.0 - 15.0 g/dL 11.8(L) 12.0 15.0  Hematocrit 36.0 - 46.0 % 36.1 36.8 44.0  Platelets 150 - 400 K/uL 191 190 -   BMP Latest Ref Rng & Units 02/16/2017 02/15/2017 02/15/2017  Glucose 65 - 99 mg/dL 135(H) 139(H) 137(H)  BUN 6 - 20 mg/dL '8 15 11  '$ Creatinine 0.44 - 1.00 mg/dL 0.79 0.80 0.88  Sodium 135 - 145 mmol/L 140 143 139  Potassium 3.5 - 5.1 mmol/L 3.8 4.7 4.1  Chloride 101 - 111 mmol/L 112(H) 108 110  CO2 22 - 32 mmol/L 22 - 21(L)  Calcium 8.9 - 10.3 mg/dL 8.1(L) - 8.8(L)   Hepatic Function Latest Ref Rng & Units 02/16/2017 02/15/2017 10/31/2015  Total Protein 6.5 - 8.1 g/dL - 6.9 7.3  Albumin 3.5 - 5.0 g/dL 2.9(L) 3.6 3.9  AST 15 - 41 U/L - 20 17  ALT 14 - 54 U/L - 12(L) 11  Alk Phosphatase 38 - 126 U/L - 101 66  Total Bilirubin 0.3 - 1.2 mg/dL - 0.5 0.7  Bilirubin, Direct 0.0 - 0.3 mg/dL - - 0.2    IMAGING/STUDIES: CTA HEAD/NECK 7/6: IMPRESSION: 1. Emergent large vessel occlusion with right M1 clot. 2. Relatively mild atherosclerosis in the neck without flow limiting stenosis. Bilateral V3 segments and ICAs are somewhat irregular, question FMD. 3. Advanced  intracranial atherosclerosis with high-grade left M2 and bilateral P2 segment stenoses. 4. Meningiomas as described on prior noncontrast study. 5. 26 mm left thyroid mass. CT HEAD W/O 7/6: IMPRESSION: 1. Probable subtle early changes of evolving acute ischemia involving the right lentiform nucleus. 2. No other acute intracranial process identified. No complication status post catheter directed revascularization. PORT CXR 7/7:  Personally reviewed by me. Questionable retrocardiac opacity with blunting of the costal cardiac angle. No other obvious opacification. No pleural  effusion appreciated. Endotracheal tube in good position.  MICROBIOLOGY: MRSA PCR 7/7:  Negative   ANTIBIOTICS: None.   SIGNIFICANT EVENTS: 07/06 - Admit & underwent mechanical thrombectomy right M1  ASSESSMENT/PLAN:  83 y.o. female with atrial fibrillation with acute right CVA. Now status post embolectomy. Neurologically seems to be intact. Patient more awake and alert.  1. Acute CVA: Status post right embolectomy. Management per neurology. 2. Acute respiratory failure: Unable to protect airway in the setting of acute CVA. Passed a spontaneous breathing trial. Extubating to nasal cannula with pulmonary toilette using incentive spirometry. Weaning FiO2. 3. Essential hypertension: Systolic blood pressure goal as per neurology. 4. Hyperlipidemia: Holding medication. 5. Atrial fibrillation: Monitoring patient on telemetry. Currently rate controlled. 6. Diabetes mellitus type 2: Continuing Accu-Cheks every 4 hours while nothing by mouth with sliding scale insulin coverage. 7. Hypomagnesemia: Mild. Replaced. 8. Anemia: Mild. No signs of active bleeding. Trending cell counts daily with CBC.  Prophylaxis:  SCDs, heparin subcutaneous every 8 hours, & Protonix IV daily at bedtime. Diet:  Nothing by mouth. Code Status:  Full code as per previous physician discussions. Disposition:  As per primary service.  Family Update: No family at bedside at the time of my rounds.  I have personally spent an additional total of 32 minutes of critical care time today caring for the patient, discussing plan of care with Dr. Leonie Man, & reviewing the patient's electronic medical record.  Remainder of care as per primary service and other consultants.  Sonia Baller Ashok Cordia, M.D. [facility] Pager:  [phone] After 3pm or if no response, call [phone] 10:07 AM 02/16/17

## 2017-02-16 NOTE — Progress Notes (Signed)
SLP Cancellation Note  Patient Details Name: Michalene Debruler MRN: [id number] DOB: 06-Dec-1923   Cancelled treatment:       Reason Eval/Treat Not Completed: Patient at procedure or test/unavailable. SLP attempted BSE after the 5 hour window post extubation, however patient was in MRI and then echocardiogram, so unable to complete. Will attempt on next date.    Sonia Baller, MA, CCC-SLP 02/16/17 5:07 PM

## 2017-02-16 NOTE — Progress Notes (Signed)
Referring Physician(s): [place]  Supervising Physician: Corrie Mckusick  Patient Status:  [facility] - In-pt  Chief Complaint: left sided paralysis, slurred speech   Subjective: Patient awake, alert, following commands, extubation planned soon; denies headache   Allergies: Atorvastatin  Medications: Prior to Admission medications   Medication Sig Start Date End Date Taking? Authorizing Provider  celecoxib (CELEBREX) 200 MG capsule Take 1 capsule (200 mg total) by mouth daily. 01/02/17   Renato Shin, MD  cyanocobalamin (,VITAMIN B-12,) 1000 MCG/ML injection Inject 1,000 mcg into the muscle every 30 (thirty) days. Reported on 10/31/2015    [provider]  triamcinolone (KENALOG) 0.025 % cream APPLY TOPICALLY AS NEEDED. 06/29/15   Renato Shin, MD     Vital Signs: BP (!) 115/58 Comment: See mar  Pulse 81   Temp 98.6 F (37 C)   Resp 12   Ht 5\' 7"  (1.702 m)   Wt 162 lb 7.7 oz (73.7 kg)   SpO2 98%   BMI 25.45 kg/m   Physical Exam awake/ FC; pupils symmetric, moving all fours but with some persistent left-sided weakness.Right groin soft, clean, dry, nontender, no hematoma.  Imaging: Ct Angio Head W Or Wo Contrast  Result Date: 02/15/2017 CLINICAL DATA:  Code stroke. EXAM: CT ANGIOGRAPHY HEAD AND NECK TECHNIQUE: Multidetector CT imaging of the head and neck was performed using the standard protocol during bolus administration of intravenous contrast. Multiplanar CT image reconstructions and MIPs were obtained to evaluate the vascular anatomy. Carotid stenosis measurements (when applicable) are obtained utilizing NASCET criteria, using the distal internal carotid diameter as the denominator. CONTRAST:  Does not available, reference EMR COMPARISON:  Noncontrast head CT from earlier today. FINDINGS: CTA NECK FINDINGS Aortic arch: Atherosclerotic plaque.  Three vessel branching. Right carotid system: Atherosclerotic plaque at the common carotid bifurcation and proximal ICA.  Mild widening of the distal right ICA. No flow limiting stenosis or ulceration. No acute dissection suspected. Left carotid system: Atherosclerosis mainly about the common carotid bifurcation and before the skullbase. The proximal ICA appears diffusely narrow and mildly irregular. Accounting for shelf-like plaque in the posterior bulb, no ulceration is suspected. Vertebral arteries: No proximal subclavian or brachiocephalic stenosis. There is plaque at the left vertebral origin with mild narrowing. Both vertebral arteries are patent to the dura. Mild undulation in widening at the V3 segments, no acute convincing dissection. Skeleton: No acute or aggressive finding. Multilevel cervical facet arthropathy. Other neck: Solid 26 mm nodule in the left thyroid. No invasive features or adenopathy. Upper chest: Unusual appearing pulmonary vein in the left upper lobe, only partly seen. Neighboring pulmonary arteries are symmetrically dense to the right, no suspected fistula. Review of the MIP images confirms the above findings CTA HEAD FINDINGS Anterior circulation: Atherosclerotic plaque on the carotid siphons. There is a right M1 cut off with poor early collaterals. Hypoplastic left A1 segment. Severe multifocal narrowing of left M2 branches, atheromatous appearing. No related M1 narrowing as it passes superior to the presumed meningioma. Posterior circulation: Symmetric vertebral arteries. Plaque on the vertebral arteries without flow limiting stenosis. Atheromatous irregularity and narrowing of the left more than right PCAs, advanced. No major branch occlusion or aneurysm. Venous sinuses: Limited venous opacification. Anatomic variants: None significant. Delayed phase: Not performed in the emergent setting. These results were called by telephone at the time of interpretation on 02/15/2017 at 8:44 pm to Dr. Dorian Pod , who verbally acknowledged these results. Review of the MIP images confirms the above findings  IMPRESSION: 1.  Emergent large vessel occlusion with right M1 clot. 2. Relatively mild atherosclerosis in the neck without flow limiting stenosis. Bilateral V3 segments and ICAs are somewhat irregular, question FMD. 3. Advanced intracranial atherosclerosis with high-grade left M2 and bilateral P2 segment stenoses. 4. Meningiomas as described on prior noncontrast study. 5. 26 mm left thyroid mass. Electronically Signed   By: Monte Fantasia M.D.   On: 02/15/2017 20:58   Ct Head Wo Contrast  Result Date: 02/15/2017 CLINICAL DATA:  Follow-up examination status post catheter directed intervention for acute right MCA territory stroke. EXAM: CT HEAD WITHOUT CONTRAST TECHNIQUE: Contiguous axial images were obtained from the base of the skull through the vertex without intravenous contrast. COMPARISON:  Prior CTA from earlier same day. FINDINGS: Brain: Stable atrophy with chronic microvascular ischemic disease. No acute intracranial hemorrhage. Possible subtle early changes of acute ischemia noted at the right lentiform nucleus. No other convincing evidence for acute evolving large vessel territory infarct. Intracranial meningiomas again noted. No other mass lesion. No midline shift. No hydrocephalus. No extra-axial fluid collection. Vascular: Contrast material seen throughout the intracranial circulation. Intracranial atherosclerosis again noted. Skull: Scalp soft tissues and calvarium within normal limits. Sinuses/Orbits: Globes and orbital soft tissues normal. Paranasal sinuses and mastoid air cells are clear. IMPRESSION: 1. Probable subtle early changes of evolving acute ischemia involving the right lentiform nucleus. 2. No other acute intracranial process identified. No complication status post catheter directed revascularization. Electronically Signed   By: Jeannine Boga M.D.   On: 02/15/2017 23:50   Ct Angio Neck W Or Wo Contrast  Result Date: 02/15/2017 CLINICAL DATA:  Code stroke. EXAM: CT ANGIOGRAPHY  HEAD AND NECK TECHNIQUE: Multidetector CT imaging of the head and neck was performed using the standard protocol during bolus administration of intravenous contrast. Multiplanar CT image reconstructions and MIPs were obtained to evaluate the vascular anatomy. Carotid stenosis measurements (when applicable) are obtained utilizing NASCET criteria, using the distal internal carotid diameter as the denominator. CONTRAST:  Does not available, reference EMR COMPARISON:  Noncontrast head CT from earlier today. FINDINGS: CTA NECK FINDINGS Aortic arch: Atherosclerotic plaque.  Three vessel branching. Right carotid system: Atherosclerotic plaque at the common carotid bifurcation and proximal ICA. Mild widening of the distal right ICA. No flow limiting stenosis or ulceration. No acute dissection suspected. Left carotid system: Atherosclerosis mainly about the common carotid bifurcation and before the skullbase. The proximal ICA appears diffusely narrow and mildly irregular. Accounting for shelf-like plaque in the posterior bulb, no ulceration is suspected. Vertebral arteries: No proximal subclavian or brachiocephalic stenosis. There is plaque at the left vertebral origin with mild narrowing. Both vertebral arteries are patent to the dura. Mild undulation in widening at the V3 segments, no acute convincing dissection. Skeleton: No acute or aggressive finding. Multilevel cervical facet arthropathy. Other neck: Solid 26 mm nodule in the left thyroid. No invasive features or adenopathy. Upper chest: Unusual appearing pulmonary vein in the left upper lobe, only partly seen. Neighboring pulmonary arteries are symmetrically dense to the right, no suspected fistula. Review of the MIP images confirms the above findings CTA HEAD FINDINGS Anterior circulation: Atherosclerotic plaque on the carotid siphons. There is a right M1 cut off with poor early collaterals. Hypoplastic left A1 segment. Severe multifocal narrowing of left M2  branches, atheromatous appearing. No related M1 narrowing as it passes superior to the presumed meningioma. Posterior circulation: Symmetric vertebral arteries. Plaque on the vertebral arteries without flow limiting stenosis. Atheromatous irregularity and narrowing of the left more than  right PCAs, advanced. No major branch occlusion or aneurysm. Venous sinuses: Limited venous opacification. Anatomic variants: None significant. Delayed phase: Not performed in the emergent setting. These results were called by telephone at the time of interpretation on 02/15/2017 at 8:44 pm to Dr. Dorian Pod , who verbally acknowledged these results. Review of the MIP images confirms the above findings IMPRESSION: 1. Emergent large vessel occlusion with right M1 clot. 2. Relatively mild atherosclerosis in the neck without flow limiting stenosis. Bilateral V3 segments and ICAs are somewhat irregular, question FMD. 3. Advanced intracranial atherosclerosis with high-grade left M2 and bilateral P2 segment stenoses. 4. Meningiomas as described on prior noncontrast study. 5. 26 mm left thyroid mass. Electronically Signed   By: Monte Fantasia M.D.   On: 02/15/2017 20:58   Ir US Guide Vasc Access Right  Result Date: 02/16/2017 INDICATION: 83 year old female with a history of acute right MCA syndrome with confirmed right M1 occlusion, referred for mechanical thrombectomy. EXAM: ULTRASOUND-GUIDED RIGHT COMMON FEMORAL ARTERY ACCESS RIGHT CERVICAL AND CEREBRAL ANGIOGRAM MECHANICAL THROMBECTOMY OF RIGHT MCA OCCLUSION SECONDARY TO THROMBOEMBOLISM COMPARISON:  CT and CT angiogram 02/15/2017 MEDICATIONS: 2.0 g Ancef. The antibiotic was administered within 1 hour of the procedure ANESTHESIA/SEDATION: General endotracheal tube anesthesia with the anesthesia team CONTRAST:  75 cc Isovue 300 FLUOROSCOPY TIME:  Fluoroscopy Time: 17 minutes 36 seconds (1,001 mGy). COMPLICATIONS: None TECHNIQUE: Informed written consent was obtained from the patient  after a thorough discussion of the procedural risks, benefits and alternatives. All questions were addressed. Maximal Sterile Barrier Technique was utilized including caps, mask, sterile gowns, sterile gloves, sterile drape, hand hygiene and skin antiseptic. A timeout was performed prior to the initiation of the procedure. FINDINGS: Initial angiogram: Right common carotid artery: Tortuous course with no significant stenosis or calcified disease of the common carotid artery. Right external carotid artery: Patent with antegrade flow. Right internal carotid artery: Mild irregular plaque at the right carotid bifurcation with no significant stenosis. Tortuous course of the cervical right ICA with no significant narrowing. Transient vasospasm present in the proximal cervical ICA with placement of the balloon guide catheter. Conical shaped out pouching from the carotid terminus favored to represent infundibulum. No significant irregular plaque of the intracranial ICA. Right MCA: Proximal occlusion of the right MCA, with patency maintained of a small proximal temporal branch. Irregular contrast margin at the proximal face of the occlusion. Right ACA: Patent A1 segment with large caliber. The right A1 perfuses both the right and left anterior cerebral artery, with a common a 2 segment. Incomplete characterization of a left-sided fenestration involving the left A1 segment. TICI score:  Right MCA TICI 0 Completion angiogram: Right internal carotid artery: Near complete resolution of the vasospasm at the completion of the case and withdrawal of the balloon guide catheter to the common carotid artery. Forward flow maintained. Right MCA: After treatment, there is restoration of complete flow through the middle cerebral artery with slight irregularity at the origin of the superior and inferior division of the MCA. No luminal thrombus or filling defect. Known embolization to new territory identified. There are 2 separate small  temporal lobe branches from the inferior aspect of the middle cerebral artery, patent. Lenticulostriate branches patent. Right ACA: A 1 segment patent. Continued filling of the bilateral anterior cerebral artery from the right-sided circulation TICI score:  Right MCA TICI 3 PROCEDURE: Informed written consent was obtained from the patient, with signing of the consent as essentially emergent consent given the absence of family, after a  thorough discussion of the procedural risks, benefits and alternatives. Specific risks addressed include: Bleeding, infection, contrast reaction, kidney injury/failure, need for further procedure/surgery, arterial injury or dissection, embolization to new territory, intracranial hemorrhage (10-15% risk), neurologic deterioration, cardiopulmonary collapse, death. All questions were addressed. Maximal Sterile Barrier Technique was utilized including during the procedure including caps, mask, sterile gowns, sterile gloves, sterile drape, hand hygiene and skin antiseptic. A timeout was performed prior to the initiation of the procedure. Ultrasound survey of the right inguinal region was performed with images stored and sent to PACs. A micropuncture needle was used access the right common femoral artery under ultrasound. With excellent arterial blood flow returned, an .018 micro wire was passed through the needle, observed to enter the abdominal aorta under fluoroscopy. The needle was removed, and a micropuncture sheath was placed over the wire. The inner dilator and wire were removed, and an 035 Bentson wire was advanced under fluoroscopy into the abdominal aorta. The sheath was removed and a standard 5 [place] vascular sheath was placed. The dilator was removed and the sheath was flushed. A 23F JB-1 diagnostic catheter was advanced over the wire to the proximal descending thoracic aorta. Wire was then removed. Double flush of the catheter was performed. Catheter was then used to select the  innominate artery. Angiogram was performed. Using roadmap technique, the catheter was advanced over a roadrunner wire into right common carotid artery. Formal angiogram was performed. The JB 1 catheter was then navigated over the roadrunner wire into the cervical ICA. Wire was removed. Exchange length Rosen wire was then passed through the diagnostic catheter to the cervical carotid artery and the diagnostic catheter was removed. The 5 French sheath was removed and exchanged for 8 French 55 centimeter BrightTip sheath. Sheath was flushed and attached to pressurized and heparinized saline bag for constant forward flow. Then an 8 [place], 85 cm Flowgate balloon tip catheter was prepared on the back table with inflation of the balloon with 50/50 concentration of dilute contrast. The balloon catheter was then advanced over the wire, positioned into the cervical ICA. Copious back flush was performed and the balloon catheter was attached to heparinized and pressurized saline bag for forward flow. Rotating hemostatic valve was attached to the back end of the balloon guide catheter. Angiogram of the intracranial ICA was performed for road map guide. A try axial intermediate catheter and microcatheter system was assembled on the back table including a 132 cm CAT 5 (Stryker) catheter with RHV, and a 150 cm coaxial Trevo Provue18 microcatheter. A synchro soft 014 wire was back-loaded with tip shape performed before insertion. Catheter system was then introduced through the balloon guide catheter, leading with the micro wire. Microcatheter system was advanced into the internal carotid artery, to the level of the occlusion. The micro wire was then carefully advanced through the occluded segment, with the cat 5 catheter supporting in the supraclinoid ICA. Microcatheter was then push through the occluded segment and the wire was removed. Blood was then aspirated through the hub of the microcatheter, and a gentle contrast injection  was performed confirming intraluminal position. A rotating hemostatic valve was then attached to the back end of the microcatheter, and a pressurized and heparinized saline bag was attached to the catheter. 4 x 40 solitaire device was then selected. Back flush was achieved at the rotating hemostatic valve, and then the device was gently advanced through the microcatheter to the distal end. The retriever was then unsheathed by withdrawing the microcatheter under fluoroscopy.  Manual constant aspiration was then performed at the tip of the intermediate guide catheter as the retriever was gently and slowly withdrawn with fluoroscopic observation. As the solitaire device was initially withdrawn, the CT 5 catheter was gently advanced to the ICA terminus and proximal MCA for local aspiration. Once the retriever was entirely removed from the intermediate catheter, free aspiration was confirmed at the hub of the intermediate catheter, with free blood return confirmed. Control angiogram was then performed. Cat 5 was then withdrawn to the cervical ICA and repeat angiogram performed. Cat 5 was removed. The 8 French femoral artery sheath was withdrawn to the iliac artery. Control angiogram was performed at the right common femoral artery puncture site. The 55 centimeter 8 French sheath was exchanged for a standard 8 French sheath at the common femoral artery puncture site. A 7 [place] Exoseal device was deployed for hemostasis. Manual pressure was applied for 15 minutes and a sterile pressure dressing was applied. Patient tolerated the procedure well and remained hemodynamically stable throughout. No complications were encountered and no significant blood loss encountered. IMPRESSION: Status post right cerebral angiogram and mechanical thrombectomy of M1 occlusion contributing to acute right MCA syndrome with restoration of TICI 3 flow. Signed, Dulcy Fanny. Earleen Newport, DO [facility]  Radiology PLAN: Stat head CT Patient remained intubated with right leg straight for 6 hours and compression dressing to be removed in a.m. Admit to neuro ICU Systolic blood pressure target 120-140 Electronically Signed   By: Corrie Mckusick D.O.   On: 02/16/2017 09:42   Dg Chest Port 1 View  Result Date: 02/16/2017 CLINICAL DATA:  Acute respiratory failure with hypoxia. EXAM: PORTABLE CHEST 1 VIEW COMPARISON:  None. FINDINGS: Endotracheal tube tip between the clavicular heads and carina. Mild retrocardiac opacity, likely atelectasis in this setting. No edema, effusion, or pneumothorax. Normal heart size and mediastinal contours. IMPRESSION: 1. Endotracheal tube in good position. 2. Mild retrocardiac atelectasis. Electronically Signed   By: Monte Fantasia M.D.   On: 02/16/2017 07:18   Ir Percutaneous Art Thrombectomy/infusion Intracranial Inc Diag Angio  Result Date: 02/16/2017 INDICATION: 83 year old female with a history of acute right MCA syndrome with confirmed right M1 occlusion, referred for mechanical thrombectomy. EXAM: ULTRASOUND-GUIDED RIGHT COMMON FEMORAL ARTERY ACCESS RIGHT CERVICAL AND CEREBRAL ANGIOGRAM MECHANICAL THROMBECTOMY OF RIGHT MCA OCCLUSION SECONDARY TO THROMBOEMBOLISM COMPARISON:  CT and CT angiogram 02/15/2017 MEDICATIONS: 2.0 g Ancef. The antibiotic was administered within 1 hour of the procedure ANESTHESIA/SEDATION: General endotracheal tube anesthesia with the anesthesia team CONTRAST:  75 cc Isovue 300 FLUOROSCOPY TIME:  Fluoroscopy Time: 17 minutes 36 seconds (1,001 mGy). COMPLICATIONS: None TECHNIQUE: Informed written consent was obtained from the patient after a thorough discussion of the procedural risks, benefits and alternatives. All questions were addressed. Maximal Sterile Barrier Technique was utilized including caps, mask, sterile gowns, sterile gloves, sterile drape, hand hygiene and skin antiseptic. A timeout was performed prior to the initiation of the procedure.  FINDINGS: Initial angiogram: Right common carotid artery: Tortuous course with no significant stenosis or calcified disease of the common carotid artery. Right external carotid artery: Patent with antegrade flow. Right internal carotid artery: Mild irregular plaque at the right carotid bifurcation with no significant stenosis. Tortuous course of the cervical right ICA with no significant narrowing. Transient vasospasm present in the proximal cervical ICA with placement of the balloon guide catheter. Conical shaped out pouching from the carotid terminus favored to represent infundibulum. No significant irregular plaque of the intracranial ICA. Right  MCA: Proximal occlusion of the right MCA, with patency maintained of a small proximal temporal branch. Irregular contrast margin at the proximal face of the occlusion. Right ACA: Patent A1 segment with large caliber. The right A1 perfuses both the right and left anterior cerebral artery, with a common a 2 segment. Incomplete characterization of a left-sided fenestration involving the left A1 segment. TICI score:  Right MCA TICI 0 Completion angiogram: Right internal carotid artery: Near complete resolution of the vasospasm at the completion of the case and withdrawal of the balloon guide catheter to the common carotid artery. Forward flow maintained. Right MCA: After treatment, there is restoration of complete flow through the middle cerebral artery with slight irregularity at the origin of the superior and inferior division of the MCA. No luminal thrombus or filling defect. Known embolization to new territory identified. There are 2 separate small temporal lobe branches from the inferior aspect of the middle cerebral artery, patent. Lenticulostriate branches patent. Right ACA: A 1 segment patent. Continued filling of the bilateral anterior cerebral artery from the right-sided circulation TICI score:  Right MCA TICI 3 PROCEDURE: Informed written consent was obtained from  the patient, with signing of the consent as essentially emergent consent given the absence of family, after a thorough discussion of the procedural risks, benefits and alternatives. Specific risks addressed include: Bleeding, infection, contrast reaction, kidney injury/failure, need for further procedure/surgery, arterial injury or dissection, embolization to new territory, intracranial hemorrhage (10-15% risk), neurologic deterioration, cardiopulmonary collapse, death. All questions were addressed. Maximal Sterile Barrier Technique was utilized including during the procedure including caps, mask, sterile gowns, sterile gloves, sterile drape, hand hygiene and skin antiseptic. A timeout was performed prior to the initiation of the procedure. Ultrasound survey of the right inguinal region was performed with images stored and sent to PACs. A micropuncture needle was used access the right common femoral artery under ultrasound. With excellent arterial blood flow returned, an .018 micro wire was passed through the needle, observed to enter the abdominal aorta under fluoroscopy. The needle was removed, and a micropuncture sheath was placed over the wire. The inner dilator and wire were removed, and an 035 Bentson wire was advanced under fluoroscopy into the abdominal aorta. The sheath was removed and a standard 5 [place] vascular sheath was placed. The dilator was removed and the sheath was flushed. A 27F JB-1 diagnostic catheter was advanced over the wire to the proximal descending thoracic aorta. Wire was then removed. Double flush of the catheter was performed. Catheter was then used to select the innominate artery. Angiogram was performed. Using roadmap technique, the catheter was advanced over a roadrunner wire into right common carotid artery. Formal angiogram was performed. The JB 1 catheter was then navigated over the roadrunner wire into the cervical ICA. Wire was removed. Exchange length Rosen wire was then passed  through the diagnostic catheter to the cervical carotid artery and the diagnostic catheter was removed. The 5 French sheath was removed and exchanged for 8 French 55 centimeter BrightTip sheath. Sheath was flushed and attached to pressurized and heparinized saline bag for constant forward flow. Then an 8 [place], 85 cm Flowgate balloon tip catheter was prepared on the back table with inflation of the balloon with 50/50 concentration of dilute contrast. The balloon catheter was then advanced over the wire, positioned into the cervical ICA. Copious back flush was performed and the balloon catheter was attached to heparinized and pressurized saline bag for forward flow. Rotating hemostatic valve was attached to  the back end of the balloon guide catheter. Angiogram of the intracranial ICA was performed for road map guide. A try axial intermediate catheter and microcatheter system was assembled on the back table including a 132 cm CAT 5 (Stryker) catheter with RHV, and a 150 cm coaxial Trevo Provue18 microcatheter. A synchro soft 014 wire was back-loaded with tip shape performed before insertion. Catheter system was then introduced through the balloon guide catheter, leading with the micro wire. Microcatheter system was advanced into the internal carotid artery, to the level of the occlusion. The micro wire was then carefully advanced through the occluded segment, with the cat 5 catheter supporting in the supraclinoid ICA. Microcatheter was then push through the occluded segment and the wire was removed. Blood was then aspirated through the hub of the microcatheter, and a gentle contrast injection was performed confirming intraluminal position. A rotating hemostatic valve was then attached to the back end of the microcatheter, and a pressurized and heparinized saline bag was attached to the catheter. 4 x 40 solitaire device was then selected. Back flush was achieved at the rotating hemostatic valve, and then the device  was gently advanced through the microcatheter to the distal end. The retriever was then unsheathed by withdrawing the microcatheter under fluoroscopy. Manual constant aspiration was then performed at the tip of the intermediate guide catheter as the retriever was gently and slowly withdrawn with fluoroscopic observation. As the solitaire device was initially withdrawn, the CT 5 catheter was gently advanced to the ICA terminus and proximal MCA for local aspiration. Once the retriever was entirely removed from the intermediate catheter, free aspiration was confirmed at the hub of the intermediate catheter, with free blood return confirmed. Control angiogram was then performed. Cat 5 was then withdrawn to the cervical ICA and repeat angiogram performed. Cat 5 was removed. The 8 French femoral artery sheath was withdrawn to the iliac artery. Control angiogram was performed at the right common femoral artery puncture site. The 55 centimeter 8 French sheath was exchanged for a standard 8 French sheath at the common femoral artery puncture site. A 7 [place] Exoseal device was deployed for hemostasis. Manual pressure was applied for 15 minutes and a sterile pressure dressing was applied. Patient tolerated the procedure well and remained hemodynamically stable throughout. No complications were encountered and no significant blood loss encountered. IMPRESSION: Status post right cerebral angiogram and mechanical thrombectomy of M1 occlusion contributing to acute right MCA syndrome with restoration of TICI 3 flow. Signed, Dulcy Fanny. Earleen Newport, DO [facility] PLAN: Stat head CT Patient remained intubated with right leg straight for 6 hours and compression dressing to be removed in a.m. Admit to neuro ICU Systolic blood pressure target 120-140 Electronically Signed   By: Corrie Mckusick D.O.   On: 02/16/2017 09:42   Ct Head Code Stroke W/o Cm  Result Date: 02/15/2017 CLINICAL  DATA:  Code stroke.  Left-sided weakness.  Slurred speech. EXAM: CT HEAD WITHOUT CONTRAST TECHNIQUE: Contiguous axial images were obtained from the base of the skull through the vertex without intravenous contrast. COMPARISON:  None. FINDINGS: Brain: No acute hemorrhage or visible infarct. Extra-axial mass in the left middle cranial fossa measuring 3 cm, with patchy internal mineralization. This is contiguous with the anterior clinoid process and most consistent with a meningioma. Based on the calcification the mass is solid and should not represent an aneurysm. There is likely a sessile dural-based mass in the upper left posterior fossa measuring 8 x  17 mm. No parenchymal edema neighboring the masses. Vascular: No hyperdense vessel.  Atherosclerotic calcification. Skull: No acute or aggressive finding.  Hyperostosis interna. Sinuses/Orbits: Gaze to the right Other: These results were called by telephone at the time of interpretation on 02/15/2017 at 8:30 pm to Dr. Aram Beecham, who verbally acknowledged these results. ASPECTS [facility] Stroke Program Early CT Score) - Ganglionic level infarction (caudate, lentiform nuclei, internal capsule, insula, M1-M3 cortex): 7 - Supraganglionic infarction (M4-M6 cortex): 3 Total score (0-10 with 10 being normal): 10 IMPRESSION: 1. No acute finding. ASPECTS is 10. 2. 3 cm extra-axial mass along the left anterior clinoid, partly calcified and most consistent with a meningioma. 3. Probable additional 17 x 8 mm left posterior fossa meningioma. Electronically Signed   By: Monte Fantasia M.D.   On: 02/15/2017 20:37    Labs:  CBC:  Recent Labs  02/15/17 2016 02/15/17 2020 02/15/17 2350 02/16/17 0015  WBC 8.1  --  7.5 7.8  HGB 14.2 15.0 12.0 11.8*  HCT 43.7 44.0 36.8 36.1  PLT 216  --  190 191    COAGS:  Recent Labs  02/15/17 2016  INR 1.00  APTT 28    BMP:  Recent Labs  02/15/17 2016 02/15/17 2020 02/16/17 0427  NA 139 143 140  K 4.1 4.7 3.8  CL 110 108  112*  CO2 21*  --  22  GLUCOSE 137* 139* 135*  BUN 11 15 8   CALCIUM 8.8*  --  8.1*  CREATININE 0.88 0.80 0.79  GFRNONAA 56*  --  >60  GFRAA >60  --  >60    LIVER FUNCTION TESTS:  Recent Labs  02/15/17 2016 02/16/17 0427  BILITOT 0.5  --   AST 20  --   ALT 12*  --   ALKPHOS 101  --   PROT 6.9  --   ALBUMIN 3.6 2.9*    Assessment and Plan: Patient history of hypertension, diabetes,afib, hyperlipidemia;now with acute right MCA syndrome with right M1 occlusion; s/p cerebral arteriogram with mechanical thrombectomy of M1 occlusion and restoration of TICI 3 flow 7/6; right groin access site stable;temp 99; CT head yesterday with probable early changes of evolving acute ischemia involving right lentiform nucleus, no other acute intracranial process;WBC normal, hemoglobin 11.8, creatinine 0.7;follow-up MRI brain; other plans as per neuro/CCM.   Electronically Signed: D. Rowe Robert, PA-C 02/16/2017, 10:50 AM   I spent a total of 15 minutes at the the patient's bedside AND on the patient's hospital floor or unit, greater than 50% of which was counseling/coordinating care for cerebral arteriogram with endovascular intervention    Patient ID: Shelby Jennings, female   DOB: 10-Feb-1924, 83 y.o.   MRN: [id number]

## 2017-02-16 NOTE — Progress Notes (Signed)
STROKE TEAM PROGRESS NOTE   HISTORY OF PRESENT ILLNESS (per record) Shelby Jennings is an 83 y.o. female with a past medical history that is significant for HTN, HLD, DM, and reportedly Afib but unclear if taking anticoagulation, who at baseline is quite functional and pretty independent but  brought to the ED as a Code Stroke by [facility] from [facility]. Neighbor saw pt slide out of chair at 1900. Pt with sudden onset of L sided paralysis, slurred speech, R sided gaze. Code Stroke called. No family present at bedside and unsuccessful attempt to contact her brother or other family member to obtain further information. She is currently awake, very dysarthric, mildly confused, with dense L Hemiparesis, L face droop, and R gaze preference. STAT CT head showed no acute abnormality, ASPECTS 10, but evidence of a large 2. 3 cm extra-axial mass along the left anterior clinoid, partly calcified and most consistent with a meningioma.as well as probable additional 17 x 8 mm left posterior fossa meningioma . CTA head revealed R M1 occlusion with poor early collaterals.   Date last known well: 02/15/17 Time last known well: 7 pm tPA Given: no, question of a fib on the chart and unknown if patient taking anticoagulation. NIHSS: 24 MRS: 0   SUBJECTIVE (INTERVAL HISTORY) No family members present. The patient is intubated. Possible extubation later today.   OBJECTIVE Temp:  [95 F (35 C)-99.7 F (37.6 C)] 99 F (37.2 C) (07/07 0700) Pulse Rate:  [48-89] 73 (07/07 0700) Cardiac Rhythm: Normal sinus rhythm (07/07 0000) Resp:  [14-22] 14 (07/07 0700) BP: (107-190)/(58-117) 115/58 (07/07 0700) SpO2:  [98 %-100 %] 100 % (07/07 0700) Arterial Line BP: (101-163)/(48-77) 101/67 (07/07 0700) FiO2 (%):  [40 %] 40 % (07/07 0305) Weight:  [73.7 kg (162 lb 7.7 oz)-76.1 kg (167 lb 12.3 oz)] 73.7 kg (162 lb 7.7 oz) (07/07 0000)  CBC:  Recent Labs Lab 02/15/17 2016  02/15/17 2350 02/16/17 0015  WBC 8.1  --   7.5 7.8  NEUTROABS 3.6  --   --  5.2  HGB 14.2  < > 12.0 11.8*  HCT 43.7  < > 36.8 36.1  MCV 96.7  --  94.6 93.8  PLT 216  --  190 191  < > = values in this interval not displayed.  Basic Metabolic Panel:  Recent Labs Lab 02/15/17 2016 02/15/17 2020 02/16/17 0015 02/16/17 0427  NA 139 143  --  140  K 4.1 4.7  --  3.8  CL 110 108  --  112*  CO2 21*  --   --  22  GLUCOSE 137* 139*  --  135*  BUN 11 15  --  8  CREATININE 0.88 0.80  --  0.79  CALCIUM 8.8*  --   --  8.1*  MG  --   --  1.7  --   PHOS  --   --   --  2.7    Lipid Panel:    Component Value Date/Time   CHOL 164 02/16/2017 0015   TRIG 127 02/16/2017 0015   HDL 29 (L) 02/16/2017 0015   CHOLHDL 5.7 02/16/2017 0015   VLDL 25 02/16/2017 0015   LDLCALC 110 (H) 02/16/2017 0015   HgbA1c:  Lab Results  Component Value Date   HGBA1C 7.2 01/02/2017   Urine Drug Screen: No results found for: LABOPIA, COCAINSCRNUR, LABBENZ, AMPHETMU, THCU, LABBARB  Alcohol Level No results found for: ETH  IMAGING  Ct Angio Head W Or Wo Contrast  Ct Angio Neck W Or Wo Contrast 02/15/2017 1. Emergent large vessel occlusion with right M1 clot.  2. Relatively mild atherosclerosis in the neck without flow limiting stenosis. Bilateral V3 segments and ICAs are somewhat irregular, question FMD.  3. Advanced intracranial atherosclerosis with high-grade left M2 and bilateral P2 segment stenoses. 4. Meningiomas as described on prior noncontrast study.  5. 26 mm left thyroid mass.    Ct Head Wo Contrast 02/15/2017 1. Probable subtle early changes of evolving acute ischemia involving the right lentiform nucleus.  2. No other acute intracranial process identified. No complication status post catheter directed revascularization.    Dg Chest Port 1 View 02/16/2017 1. Endotracheal tube in good position.  2. Mild retrocardiac atelectasis.    Ct Head Code Stroke W/o Cm 02/15/2017 1. No acute finding. ASPECTS is 10.  2. 3 cm extra-axial mass along  the left anterior clinoid, partly calcified and most consistent with a meningioma.  3. Probable additional 17 x 8 mm left posterior fossa meningioma.    Interventional Radiology - Dr Earleen Newport 02/16/2017 Status post right cerebral angiogram and mechanical thrombectomy of M1 occlusion contributing to acute right MCA syndrome with restoration of TICI 3 flow.   PHYSICAL EXAM Elderly african [place] lady who is intubated but not sedated. . Afebrile. Head is nontraumatic. Neck is supple without bruit.    Cardiac exam no murmur or gallop. Lungs are clear to auscultation. Distal pulses are well felt. Neurological Exam :  Intubated. Easily arousable. Opens eyes. Right gaze preference  Decreased blink to thr. Pupils equal reactive Follows simple midline commands and moves all 4 extremities against gravity. Mild left hemiparesis.Marland Kitchen Deep tendon reflexes are symmetric. Plantars downgoing     ASSESSMENT/PLAN Shelby Jennings is a 83 y.o. female with history of osteoarthritis, hypertension, hyperlipidemia, atrial fibrillation (unknown anticoagulation status) and diabetes mellitus presenting with dysarthria, confusion, left hemiparesis, left facial droop, and right gaze preference. She did not receive IV t-PA as it was unclear if she was on anticoagulation prior to admission. The patient underwent thrombectomy as noted above.  Stroke:  Right M1 territory - embolic - probably due to atrial fibrillation.  Resultant  Mild left hemiparesis  CT head - Probable subtle early changes of evolving acute ischemia involving the right lentiform nucleus.   MRI head - pending  MRA head - not performed  CTA H&N - Right M1 occlusion with clot. High-grade left M2 and bilateral P2 segment stenoses.   Carotid Doppler - CTA neck  2D Echo -  pending  LDL - 110  HgbA1c - pending  VTE prophylaxis - subcutaneous heparin  Diet NPO time specified  No antithrombotic prior to admission, now on No antithrombotic ->  ASA suppository for now.  Patient will be counseled to be compliant with her antithrombotic medications  Ongoing aggressive stroke risk factor management  Therapy recommendations:  pending  Disposition: Pending  Hypertension  Stable  Per interventional radiology -> SBP goal 120 to 140  Long-term BP goal normotensive  Hyperlipidemia  Home meds:  No lipid lowering medications prior to admission  LDL 110, goal < 70  Add Lipitor when taking POs  Continue statin at discharge  Diabetes  HgbA1c pending, goal < 7.0  Unc / Controlled  Other Stroke Risk Factors  Advanced age  Overweight, Body mass index is 25.45 kg/m., recommend weight loss, diet and exercise as appropriate    Other Active Problems  26 mm left thyroid mass (incidental finding)  Meningiomas (incidental finding)  High-grade left M2 and bilateral P2 segment stenoses.   Hospital day # 1  I have personally examined this patient, reviewed notes, independently viewed imaging studies, participated in medical decision making and plan of care.ROS completed by me personally and pertinent positives fully documented  I have made any additions or clarifications directly to the above note.  She presented with right M1 occlusion and underwent successful mechanical embolectomy and appears to be doing Plan extubation later . Continue strict BP control and close neurological monitoringStart aspirin. Continue ongoing stroke w/u. No family available at bedside for discussion. Discuss with Dr. Ashok Cordia This patient is critically ill and at significant risk of neurological worsening, death and care requires constant monitoring of vital signs, hemodynamics,respiratory and cardiac monitoring, extensive review of multiple databases, frequent neurological assessment, discussion with family, other specialists and medical decision making of high complexity.I have made any additions or clarifications directly to the above note.This  critical care time does not reflect procedure time, or teaching time or supervisory time of PA/NP/Med Resident etc but could involve care discussion time.  I spent 35 minutes of neurocritical care time  in the care of  this patient.     Antony Contras, MD Medical Director [place] Pager: [phone] 02/16/2017 1:17 PM   To contact Stroke Continuity provider, please refer to [email or web address] After hours, contact General Neurology

## 2017-02-16 NOTE — Evaluation (Signed)
Physical Therapy Evaluation Patient Details Name: Shelby Jennings MRN: [id number] DOB: [birth year] Today's Date: 02/16/2017   History of Present Illness  83 y.o. female admitted on 02/15/17 for left sided weakness and slurred speech. CTA head revealed R M1 occlusion and pt underwent clot retrieval in IR on day of admission.  She was intubated post proceedure and extuabted 02/16/17.  Pt with significant PMH of OA, DM 2, and HTN.    Clinical Impression  Pt with right sided gaze and left inattention.  She is weak, but does move her left side and needs two person assist to transfer to chair and BSC.  She would benefit from intensive post acute rehab given how independent she was PTA.   PT to follow acutely for deficits listed below.       Follow Up Recommendations CIR    Equipment Recommendations  3in1 (PT);Rolling walker with 5" wheels;Other (comment) (left platform)    Recommendations for Other Services Rehab consult     Precautions / Restrictions Precautions Precautions: Fall Precaution Comments: left side weak and inattantion.       Mobility  Bed Mobility Overal bed mobility: Needs Assistance Bed Mobility: Supine to Sit     Supine to sit: Mod assist;HOB elevated     General bed mobility comments: Mod assist to help progress left arm and leg to EOB.  Assist at trunk (although pt is trying to pull trunk up using right hand on bedrail) to get to upright sitting, pt using posterior leaning strategy to scoot hips forward to EOB, therapist assisting in reciprocal weight shifting.  Initally leaning right and posteriorly.   Transfers Overall transfer level: Needs assistance Equipment used: 1 person hand held assist;2 person hand held assist Transfers: Sit to/from [employer] Transfers Sit to Stand: +2 physical assistance;Mod assist Stand pivot transfers: +2 physical assistance;Mod assist Squat pivot transfers: +2 physical assistance;Mod assist     General  transfer comment: One person mod assist to initally squat pivot to drop arm recliner chair on pt's right side.  However, when pt verbalized the need to use the [facility], therapist called in extra hands for safety in stand pivot towards her right to [facility].  Inital stand is good, but pt had difficulty moving feet and turning hips around to [facility].  "I'm going to fall" and pushing back to the left when attempting to move to her right.  She did better transferring back to the recliner chair on her right likely due to her tendancy to push right.  Transferring right from [facility] still required two person mod assist.        Modified Rankin (Stroke Patients Only) Modified Rankin (Stroke Patients Only) Pre-Morbid Rankin Score: No symptoms Modified Rankin: Moderately severe disability     Balance Overall balance assessment: Needs assistance Sitting-balance support: Feet supported;Single extremity supported Sitting balance-Leahy Scale: Poor Sitting balance - Comments: mod assist for midline balance EOB due to posterior and right lateral lean Postural control: Posterior lean;Right lateral lean Standing balance support: Single extremity supported Standing balance-Leahy Scale: Poor Standing balance comment: two person mod assist to stand, now due to pushing left posterior lean                             Pertinent Vitals/Pain Pain Assessment: No/denies pain    Home Living Family/patient expects to be discharged to:: Private residence Living Arrangements: Alone Available Help at Discharge: Friend(s);Available PRN/intermittently Type of Home: Apartment [facility]  [place]) Home Access: Elevator     Home Layout: One level Home Equipment: Cane - single point      Prior Function Level of Independence: Needs assistance   Gait / Transfers Assistance Needed: Pt uses a cane "when I need it" occasionally, does not drive (she takes a cab or neighbors will drive her if she pays).       Comments: She has  family, but seems a bit estranged from them.  This will need to be investigated further.      Hand Dominance   Dominant Hand: Right    Extremity/Trunk Assessment   Upper Extremity Assessment Upper Extremity Assessment: Defer to OT evaluation    Lower Extremity Assessment Lower Extremity Assessment: LLE deficits/detail LLE Deficits / Details: left leg with grossly 2/5 ankle, 2/5 knee, 2/5 hip.  Sensation grossly intact to LT and feels the same as her other leg.  LLE Coordination: decreased gross motor;decreased fine motor    Cervical / Trunk Assessment Cervical / Trunk Assessment: Normal  Communication   Communication: No difficulties  Cognition Arousal/Alertness: Awake/alert Behavior During Therapy: WFL for tasks assessed/performed Overall Cognitive Status: Impaired/Different from baseline Area of Impairment: Attention;Memory;Following commands;Safety/judgement;Awareness;Problem solving;Orientation                 Orientation Level: Disoriented to;Situation Current Attention Level: Sustained Memory: Decreased short-term memory Following Commands: Follows one step commands consistently Safety/Judgement: Decreased awareness of safety;Decreased awareness of deficits Awareness: Intellectual Problem Solving: Slow processing;Difficulty sequencing;Requires verbal cues;Requires tactile cues General Comments: Pt with decreased awareness of her left sided weakness, right gaze, but able to cross midline with cues.  She was not quick with offering up stroke, but when repeated later in the session, she did report she had a stroke.               Assessment/Plan    PT Assessment Patient needs continued PT services  PT Problem List Decreased strength;Decreased activity tolerance;Decreased balance;Decreased mobility;Decreased knowledge of use of DME;Impaired tone;Impaired sensation       PT Treatment Interventions DME instruction;Gait training;Functional mobility  training;Therapeutic activities;Therapeutic exercise;Balance training;Neuromuscular re-education;Cognitive remediation;Patient/family education;Wheelchair mobility training    PT Goals (Current goals can be found in the Care Plan section)  Acute Rehab PT Goals Patient Stated Goal: to get back to her independence PT Goal Formulation: With patient Time For Goal Achievement: 03/02/17 Potential to Achieve Goals: Good    Frequency Min 4X/week   Barriers to discharge Decreased caregiver support lives alone       AM-PAC PT "6 Clicks" Daily Activity  Outcome Measure Difficulty turning over in bed (including adjusting bedclothes, sheets and blankets)?: Total Difficulty moving from lying on back to sitting on the side of the bed? : Total Difficulty sitting down on and standing up from a chair with arms (e.g., wheelchair, bedside commode, etc,.)?: Total Help needed moving to and from a bed to chair (including a wheelchair)?: A Lot Help needed walking in hospital room?: Total Help needed climbing 3-5 steps with a railing? : Total 6 Click Score: 7    End of Session Equipment Utilized During Treatment: Gait belt Activity Tolerance: Patient tolerated treatment well Patient left: in chair;with call bell/phone within reach;with chair alarm set Nurse Communication: Mobility status PT Visit Diagnosis: Difficulty in walking, not elsewhere classified (R26.2);Hemiplegia and hemiparesis Hemiplegia - Right/Left: Left Hemiplegia - dominant/non-dominant: Non-dominant Hemiplegia - caused by: Cerebral infarction    Time: 1610-9604 PT Time Calculation (min) (ACUTE ONLY): 39 min   Charges:  Barbarann Ehlers Jhovany Weidinger, PT, DPT [phone]    PT Evaluation $PT Eval Moderate Complexity: 1 Procedure PT Treatments $Therapeutic Activity: 23-37 mins   02/16/2017, 1:29 PM

## 2017-02-16 NOTE — Procedures (Signed)
Extubation Procedure Note  Patient Details:   Name: Shelby Jennings DOB: 05/27/1924 MRN: [id number]   Airway Documentation:     Evaluation  O2 sats: stable throughout Complications: No apparent complications Patient did tolerate procedure well. Bilateral Breath Sounds: Rhonchi, Diminished   Patient extubated to 2L [place] without incident. Cuff leak noted prior to extubation. Patient able to verbalize post extubation.   Mali M Aissata Wilmore 02/16/2017, 10:46 AM

## 2017-02-16 NOTE — Progress Notes (Signed)
  Echocardiogram 2D Echocardiogram has been performed.  Shelby Jennings 02/16/2017, 5:32 PM

## 2017-02-16 NOTE — Progress Notes (Signed)
Rehab Admissions Coordinator Note:  Patient was screened by Cleatrice Burke for appropriateness for an Inpatient Acute Rehab Consult per PT recommendation. At this time, we are recommending Inpatient Rehab consult.  Cleatrice Burke 02/16/2017, 2:03 PM  I can be reached at [phone].

## 2017-02-17 ENCOUNTER — Inpatient Hospital Stay (HOSPITAL_COMMUNITY): Payer: Medicare Other

## 2017-02-17 DIAGNOSIS — I639 Cerebral infarction, unspecified: Secondary | ICD-10-CM

## 2017-02-17 LAB — RENAL FUNCTION PANEL
ALBUMIN: 3.4 g/dL — AB (ref 3.5–5.0)
Anion gap: 9 (ref 5–15)
CALCIUM: 8.5 mg/dL — AB (ref 8.9–10.3)
CO2: 24 mmol/L (ref 22–32)
Chloride: 107 mmol/L (ref 101–111)
Creatinine, Ser: 0.68 mg/dL (ref 0.44–1.00)
GFR calc Af Amer: >60 mL/min (ref >60)
Glucose, Bld: 127 mg/dL — ABNORMAL HIGH (ref 65–99)
PHOSPHORUS: 2.9 mg/dL (ref 2.5–4.6)
POTASSIUM: 3.3 mmol/L — AB (ref 3.5–5.1)
SODIUM: 140 mmol/L (ref 135–145)

## 2017-02-17 LAB — ECHOCARDIOGRAM COMPLETE
EERAT: 8.45
EWDT: 161 ms
FS: 33 % (ref 28–44)
Height: 67 in
IVS/LV PW RATIO, ED: 0.89
LA diam end sys: 36 mm
LA diam index: 1.95 cm/m2
LA vol index: 19 mL/m2
LASIZE: 36 mm
LAVOL: 35.1 mL
LAVOLA4C: 28.8 mL
LV e' LATERAL: 9.83 cm/s
LVEEAVG: 8.45
LVEEMED: 8.45
LVOT SV: 50 mL
LVOT VTI: 17.5 cm
LVOT area: 2.84 cm2
LVOT diameter: 19 mm
LVOT peak vel: 100 cm/s
MV Dec: 161
MV Peak grad: 3 mmHg
MV pk E vel: 83.1 m/s
MVPKAVEL: 91.3 m/s
Mean grad: 268 mmHg
PW: 10.5 mm — AB (ref 0.6–1.1)
RV sys press: 22 mmHg
Reg peak vel: 216 cm/s
TAPSE: 16.5 mm
TDI e' lateral: 9.83
TDI e' medial: 6.32
TRMAXVEL: 216 cm/s
Weight: 2599.66 oz

## 2017-02-17 LAB — CBC WITH DIFFERENTIAL/PLATELET
BASOS ABS: 0 10*3/uL (ref 0.0–0.1)
BASOS PCT: 0 %
EOS ABS: 0.2 10*3/uL (ref 0.0–0.7)
EOS PCT: 2 %
HCT: 41.6 % (ref 36.0–46.0)
Hemoglobin: 13.5 g/dL (ref 12.0–15.0)
LYMPHS PCT: 33 %
Lymphs Abs: 3.1 10*3/uL (ref 0.7–4.0)
MCH: 30.8 pg (ref 26.0–34.0)
MCHC: 32.5 g/dL (ref 30.0–36.0)
MCV: 94.8 fL (ref 78.0–100.0)
MONO ABS: 1.3 10*3/uL — AB (ref 0.1–1.0)
Monocytes Relative: 13 %
Neutro Abs: 4.8 10*3/uL (ref 1.7–7.7)
Neutrophils Relative %: 52 %
PLATELETS: 237 10*3/uL (ref 150–400)
RBC: 4.39 MIL/uL (ref 3.87–5.11)
RDW: 13.9 % (ref 11.5–15.5)
WBC: 9.4 10*3/uL (ref 4.0–10.5)

## 2017-02-17 LAB — GLUCOSE, CAPILLARY
GLUCOSE-CAPILLARY: 104 mg/dL — AB (ref 65–99)
GLUCOSE-CAPILLARY: 110 mg/dL — AB (ref 65–99)
GLUCOSE-CAPILLARY: 121 mg/dL — AB (ref 65–99)
GLUCOSE-CAPILLARY: 124 mg/dL — AB (ref 65–99)
Glucose-Capillary: 123 mg/dL — ABNORMAL HIGH (ref 65–99)
Glucose-Capillary: 107 mg/dL — ABNORMAL HIGH (ref 65–99)

## 2017-02-17 LAB — HEMOGLOBIN A1C
HEMOGLOBIN A1C: 7.2 % — AB (ref 4.8–5.6)
Mean Plasma Glucose: 160 mg/dL

## 2017-02-17 LAB — MAGNESIUM: MAGNESIUM: 2 mg/dL (ref 1.7–2.4)

## 2017-02-17 MED ORDER — CHLORHEXIDINE GLUCONATE 0.12 % MT SOLN
OROMUCOSAL | Status: AC
  Filled 2017-02-17: qty 15

## 2017-02-17 MED ORDER — POTASSIUM CHLORIDE 10 MEQ/100ML IV SOLN
10.0000 meq | INTRAVENOUS | Status: AC
  Administered 2017-02-17 (×4): 10 meq via INTRAVENOUS
  Filled 2017-02-17 (×4): qty 100

## 2017-02-17 MED ORDER — LABETALOL HCL 5 MG/ML IV SOLN
10.0000 mg | INTRAVENOUS | Status: DC | PRN
  Administered 2017-02-17: 10 mg via INTRAVENOUS
  Filled 2017-02-17: qty 4

## 2017-02-17 NOTE — Evaluation (Signed)
Speech Language Pathology Evaluation Patient Details Name: Shelby Jennings MRN: [id number] DOB: 1923/09/13 Today's Date: 02/17/2017 Time: 4098-1191 SLP Time Calculation (min) (ACUTE ONLY): 15 min  Problem List:  Patient Active Problem List   Diagnosis Date Noted  . Acute respiratory failure with hypoxia ([place])   . Stroke ([place]) 02/15/2017  . Stroke with cerebral ischemia ([place]) 02/15/2017  . Memory loss 06/11/2014  . Thyroid mass 03/23/2011  . Encounter for long-term (current) use of other medications 12/21/2010  . Routine general medical examination at a health care facility 12/21/2010  . KNEE PAIN, RIGHT 12/29/2009  . Diabetes ([place]) 06/24/2009  . PERS HX NONCOMPLIANCE W/MED TX PRS HAZARDS HLTH 06/24/2009  . UNSPECIFIED PRURITIC DISORDER 06/24/2007  . Dyslipidemia 05/09/2007  . Essential hypertension 05/09/2007  . DYSHIDROSIS 05/09/2007  . OSTEOARTHRITIS 05/09/2007   Past Medical History:  Past Medical History:  Diagnosis Date  . DIABETES MELLITUS, TYPE II 06/24/2009  . HYPERLIPIDEMIA 05/09/2007  . HYPERTENSION 05/09/2007  . Morbid obesity ([place])   . OSTEOARTHRITIS 05/09/2007   Past Surgical History:  Past Surgical History:  Procedure Laterality Date  . IR PERCUTANEOUS ART THROMBECTOMY/INFUSION INTRACRANIAL INC DIAG ANGIO  02/15/2017  . IR US GUIDE VASC ACCESS RIGHT  02/15/2017   HPI:  Shelby Jennings an 83 y.o.femalewith a past medical history that is significant for HTN, HLD, DM, and reportedly Afibbut unclear if taking anticoagulation, who at baseline is quite functional and pretty independent but brought to the ED as a Code Stroke by [facility] from [facility]. Neighbor saw pt slide out of chair at 1900. Pt with sudden onset of L sidedparalysis, slurred speech, R sided gaze. Code Stroke called.  No family present at bedside and unsuccessful attempt to contact her brother or other family member to obtain further information.  She is currently awake, very dysarthric, mildly confused,  with dense L Hemiparesis, L face droop, and R gaze preference.  STAT CT head showed no acute abnormality, ASPECTS 10, but evidence of a large 2. 3 cm extra-axial mass along the left anterior clinoid, partly calcified and most consistent with a meningioma.as well as probable additional 17 x 8 mm left posterior fossa meningioma.  CTA head revealed R M1 occlusion withpoor early collaterals.   Patient is s/p thrombectomy.  MRI is showing acute to subacute right MCA territory infarcts and old left basal ganglia and right cerebellar infarcts.     Assessment / Plan / Recommendation Clinical Impression  Cognitive/linguistic and motor speech screen was completed.  The patient was noted to have left sided labial droop.  Vocal volume was very low and patient was noted to be dysarthric.  Speech intelligibility was impacted but in general with careful listening the patient was understood.  The patient was observed to have a right gaze preference but given cues she was able to look to the left with eyes moving past midline.  The patient achieved a score of 20 out of possible 28 points on the [place] Exam (writing tasks were not administered as the patient was in restraints).  Deficits were noted for orientation and delayed recall.  The patient was oriented to person, place and situation but disoriented to time.  Immediate recall of 3 novel words was good but given a short delay semantic cue was needed to facilitate recall of the 3 novel words.  She was able to perform the attention task, follow a 3 step direction, read/comprehend a short phrase and name objects.  The patient was also not able  to provide logical solutions to simple problems.  Given results of evaluation recommend continued ST at next level of care to address dysarthria and cognitive deficits.  Currently patient is not felt to be safe to go home alone.  ST will follow during acute stay.     SLP Assessment  SLP Recommendation/Assessment: Patient  needs continued Speech Lanaguage Pathology Services SLP Visit Diagnosis: Dysarthria and anarthria (R47.1)    Follow Up Recommendations  Other (comment) (ST follow up at next level of care.  )    Frequency and Duration min 2x/week  2 weeks      SLP Evaluation Cognition  Overall Cognitive Status: Impaired/Different from baseline Arousal/Alertness: Awake/alert Orientation Level: Oriented to person;Oriented to place;Oriented to situation;Disoriented to time Attention: Focused Focused Attention: Impaired Focused Attention Impairment: Verbal complex Memory: Impaired Memory Impairment: Decreased recall of new information Problem Solving: Impaired Problem Solving Impairment: Verbal basic       Comprehension  Auditory Comprehension Overall Auditory Comprehension: Appears within functional limits for tasks assessed Commands: Within Functional Limits Conversation: Simple Reading Comprehension Reading Status: Within funtional limits    Expression Expression Primary Mode of Expression: Verbal Verbal Expression Overall Verbal Expression: Appears within functional limits for tasks assessed Initiation: No impairment Automatic Speech: Name;Social Response Level of Generative/Spontaneous Verbalization: Phrase Repetition: No impairment Naming: No impairment Pragmatics: No impairment Non-Verbal Means of Communication: Not applicable Written Expression Dominant Hand: Right Written Expression: Not tested   Oral / Motor  Oral Motor/Sensory Function Overall Oral Motor/Sensory Function: Mild impairment Facial ROM: Reduced left Facial Symmetry: Abnormal symmetry left Facial Strength: Reduced left Lingual ROM: Within Functional Limits Lingual Symmetry: Within Functional Limits Lingual Strength: Within Functional Limits Mandible: Within Functional Limits Motor Speech Overall Motor Speech: Impaired Phonation: Low vocal intensity Resonance: Within functional limits Intelligibility:  Intelligibility reduced Word: 75-100% accurate Phrase: 50-74% accurate Sentence: 50-74% accurate Conversation: 50-74% accurate Motor Planning: Witnin functional limits Motor Speech Errors: Not applicable   GO                    Shelby Flatten, MA, CCC-SLP Acute Rehab SLP [phone] Lamar Sprinkles 02/17/2017, 8:29 AM

## 2017-02-17 NOTE — Progress Notes (Signed)
[facility] Attending Note  ADMISSION DATE:  02/15/2017  CONSULTATION DATE:  02/15/2017  REFERRING MD :  Wyatt Portela, MD  CHIEF COMPLAINT:  CVA  Presenting HPI:  83 y.o. female with known history of hyperlipidemia, hypertension, and atrial fibrillation presenting with an acute CVA. CT imaging showed a right sided occlusion. Patient underwent mechanical thrombectomy and remained intubated post procedure. PCCM was consulted for medical and ventilator management.   Subjective:  Patient extubated yesterday. Denies any dyspnea. Only with mild, intermittent cough. No chest pain or pressure. Denies any headache.  Review of Systems:  No subjective fever or chills. No abdominal pain or nausea.  Temp:  [97.7 F (36.5 C)-99.3 F (37.4 C)] 98.1 F (36.7 C) (07/08 0800) Pulse Rate:  [65-119] 80 (07/08 0845) Resp:  [13-22] 21 (07/08 0845) BP: (106-163)/(59-113) 132/66 (07/08 0845) SpO2:  [94 %-100 %] 100 % (07/08 0845) Arterial Line BP: (85-119)/(54-105) 107/90 (07/07 2000)  General:  Sleeping until awoken. No distress. No family at bedside.  Integument:  Warm & dry. No rash on exposed skin.  Extremities:  No cyanosis or clubbing.  HEENT:  Moist mucus membranes. No oral ulcers. No scleral icterus. Cardiovascular:  Regular rate. No edema. No appreciable JVD.  Pulmonary: Normal work of breathing on nasal cannula oxygen. Slightly diminished breath sounds in bases. No accessory muscle use. Abdomen: Soft. Normal bowel sounds. Nondistended. Nontender. Musculoskeletal:  Normal bulk and tone. Hand grip strength 5/5 bilaterally. No joint deformity or effusion appreciated. Neurological: Left facial droop with flattening of the left nasolabial fold. Following commands. Oriented 3.  LINES/TUBES: OETT 7.5 7/6 - 7/7 RADIAL ART LINE 7/6 >>> Foley >>> PIV  CBC Latest Ref Rng & Units 02/17/2017 02/16/2017 02/15/2017  WBC 4.0 - 10.5 K/uL 9.4 7.8 7.5  Hemoglobin 12.0 - 15.0 g/dL 56.1  11.8(L) 12.0  Hematocrit 36.0 - 46.0 % 41.6 36.1 36.8  Platelets 150 - 400 K/uL 237 191 190   BMP Latest Ref Rng & Units 02/17/2017 02/16/2017 02/15/2017  Glucose 65 - 99 mg/dL 254(K) 323(G) 688(T)  BUN 6 - 20 mg/dL <3(Z) 8 15  Creatinine 0.44 - 1.00 mg/dL 3.08 1.68 3.87  Sodium 135 - 145 mmol/L 140 140 143  Potassium 3.5 - 5.1 mmol/L 3.3(L) 3.8 4.7  Chloride 101 - 111 mmol/L 107 112(H) 108  CO2 22 - 32 mmol/L 24 22 -  Calcium 8.9 - 10.3 mg/dL 0.6(N) 8.1(L) -   Hepatic Function Latest Ref Rng & Units 02/17/2017 02/16/2017 02/15/2017  Total Protein 6.5 - 8.1 g/dL - - 6.9  Albumin 3.5 - 5.0 g/dL 8.2(G) 2.9(L) 3.6  AST 15 - 41 U/L - - 20  ALT 14 - 54 U/L - - 12(L)  Alk Phosphatase 38 - 126 U/L - - 101  Total Bilirubin 0.3 - 1.2 mg/dL - - 0.5  Bilirubin, Direct 0.0 - 0.3 mg/dL - - -    IMAGING/STUDIES: CTA HEAD/NECK 7/6: IMPRESSION: 1. Emergent large vessel occlusion with right M1 clot. 2. Relatively mild atherosclerosis in the neck without flow limiting stenosis. Bilateral V3 segments and ICAs are somewhat irregular, question FMD. 3. Advanced intracranial atherosclerosis with high-grade left M2 and bilateral P2 segment stenoses. 4. Meningiomas as described on prior noncontrast study. 5. 26 mm left thyroid mass. CT HEAD W/O 7/6: IMPRESSION: 1. Probable subtle early changes of evolving acute ischemia involving the right lentiform nucleus. 2. No other acute intracranial process identified. No complication status post catheter directed revascularization. PORT CXR 7/7:  Previously  reviewed by me. Questionable retrocardiac opacity with blunting of the costal cardiac angle. No other obvious opacification. No pleural effusion appreciated. Endotracheal tube in good position. MRI BRAIN W/O 7/7:   IMPRESSION: 1. Motion degraded examination. Acute to possibly subacute multifocal small RIGHT MCA territory infarcts without hemorrhagic conversion. 2. Moderate chronic small vessel ischemic disease. Old LEFT  basal ganglia and RIGHT cerebellar small infarcts.  3. 2.8 x 2.1 cm LEFT paraclinoid meningioma.  MICROBIOLOGY: MRSA PCR 7/7:  Negative   ANTIBIOTICS: None.   SIGNIFICANT EVENTS: 07/06 - Admit & underwent mechanical thrombectomy right M1  ASSESSMENT/PLAN:  83 y.o. female with underlying atrial fibrillation status post right CVA. Patient underwent embolectomy. Respiratory status remained stable postextubation. Patient continuing on infusion of Cleviprex for blood pressure control.  1. Acute CVA: Post embolectomy. Management per neurology. Continuing on rectal aspirin.  2. Essential hypertension: Systolic blood pressure goal as per neurology. Continuing on Cleviprex. 3. Acute hypoxic respiratory failure: Improving. Continuing pulmonary toilet with incentive spirometry. Weaning FiO2. 4. Hypokalemia: Replacing with KCl IV. Repeat electrolytes in the morning. 5. Hyperlipidemia: Holding home medication given nothing by mouth status. 6. Atrial fibrillation: Monitoring patient on telemetry. 7. Diabetes mellitus type 2: Glucose control. Continuing Accu-Cheks every 4 hours while nothing by mouth. Continuing sliding scale insulin coverage. 8. Hypomagnesemia: Resolved. 9. Anemia: Mild. No active bleeding. Trending cell counts with repeat CBC tomorrow.   Prophylaxis:  SCDs & heparin subcutaneous every 8 hours. D/C Protonix.  Diet:  Speech following.  Code Status:  Full code as per previous physician discussions. Disposition:  As per primary service.  Family Update: No family at bedside at the time of my rounds.   I have spent a total of 36 minutes of time today caring for the patient, reviewing the patient's electronic medical record, and with more than 50% of that time spent coordinating care with the patient as well as reviewing the continuing plan of care with the patient at bedside.  Remainder of care as per primary service and other consultants. PCCM will sign off at this time. Please let us  know if we can be of any further assistance in the care of this patient.   Sonia Baller Ashok Cordia, M.D. [facility]monary [facility] Critical [facility] Pager:  [phone] After 3pm or if no response, call [phone] 9:55 AM 02/17/17

## 2017-02-17 NOTE — Evaluation (Signed)
Clinical/Bedside Swallow Evaluation Patient Details  Name: Shelby Jennings MRN: [id number] Date of Birth: November 03, 1923  Today's Date: 02/17/2017 Time: SLP Start Time (ACUTE ONLY): 0745 SLP Stop Time (ACUTE ONLY): 0800 SLP Time Calculation (min) (ACUTE ONLY): 15 min  Past Medical History:  Past Medical History:  Diagnosis Date  . DIABETES MELLITUS, TYPE II 06/24/2009  . HYPERLIPIDEMIA 05/09/2007  . HYPERTENSION 05/09/2007  . Morbid obesity ([place])   . OSTEOARTHRITIS 05/09/2007   Past Surgical History:  Past Surgical History:  Procedure Laterality Date  . IR PERCUTANEOUS ART THROMBECTOMY/INFUSION INTRACRANIAL INC DIAG ANGIO  02/15/2017  . IR US GUIDE VASC ACCESS RIGHT  02/15/2017   HPI:  Asherah Lavoy an 83 y.o.femalewith a past medical history that is significant for HTN, HLD, DM, and reportedly Af[place] unclear if taking anticoagulation, who at baseline is quite functional and pretty independent but brought to the ED as a Code Stroke by [facility] from [facility]. Neighbor saw pt slide out of chair at 1900. Pt with sudden onset of L sidedparalysis, slurred speech, R sided gaze. Code Stroke called.  No family present at bedside and unsuccessful attempt to contact her brother or other family member to obtain further information.  She is currently awake, very dysarthric, mildly confused, with dense L Hemiparesis, L face droop, and R gaze preference.  STAT CT head showed no acute abnormality, ASPECTS 10, but evidence of a large 2. 3 cm extra-axial mass along the left anterior clinoid, partly calcified and most consistent with a meningioma.as well as probable additional 17 x 8 mm left posterior fossa meningioma.  CTA head revealed R M1 occlusion withpoor early collaterals.   Patient is s/p thrombectomy.  MRI is showing acute to subacute right MCA territory infarcts and old left basal ganglia and right cerebellar infarcts.     Assessment / Plan / Recommendation Clinical Impression  Clinical swallowing  evaluation was completed using thin liquids and pureed material.  Oral mechanism exam was completed and remarkable for left sided labial/facial droop.  Patient's vocal volume was also noted to be low and speech was somewhat hard to understand due to dysarthria.  Given oral intake the patient was noted to be slow to move material orally with some decreased awareness of the bolus.  She was able to contain the bolus orally.  Delayed swallow trigger and multiple swallows were seen to clear each bolus.  Strong delayed cough following cup sips of thin liquids were noted.  Recommend that the patient remain NPO and MBS be completed to determine current swallowing physiology and least restrictive diet.   SLP Visit Diagnosis: Dysphagia, oropharyngeal phase (R13.12)    Aspiration Risk  Moderate aspiration risk    Diet Recommendation   NPO pending MBS results.    Medication Administration: Via alternative means    Other  Recommendations Oral Care Recommendations: Oral care QID   Follow up Recommendations Other (comment) (ST follow up at next level of care.  )      Frequency and Duration min 2x/week  2 weeks       Prognosis Prognosis for Safe Diet Advancement: Good      Swallow Study   General Date of Onset: 02/15/17 HPI: Shelby Jennings an 83 y.o.femalewith a past medical history that is significant for HTN, HLD, DM, and reportedly Af[place] unclear if taking anticoagulation, who at baseline is quite functional and pretty independent but brought to the ED as a Code Stroke by [facility] from [facility]. Neighbor saw pt slide out of chair  at 1900. Pt with sudden onset of L sidedparalysis, slurred speech, R sided gaze. Code Stroke called.  No family present at bedside and unsuccessful attempt to contact her brother or other family member to obtain further information.  She is currently awake, very dysarthric, mildly confused, with dense L Hemiparesis, L face droop, and R gaze preference.  STAT CT head  showed no acute abnormality, ASPECTS 10, but evidence of a large 2. 3 cm extra-axial mass along the left anterior clinoid, partly calcified and most consistent with a meningioma.as well as probable additional 17 x 8 mm left posterior fossa meningioma.  CTA head revealed R M1 occlusion withpoor early collaterals.   Patient is s/p thrombectomy.  MRI is showing acute to subacute right MCA territory infarcts and old left basal ganglia and right cerebellar infarcts.   Type of Study: Bedside Swallow Evaluation Previous Swallow Assessment: No previous swallow work up noted at [facility].   Diet Prior to this Study: NPO Temperature Spikes Noted: No Respiratory Status: Nasal cannula History of Recent Intubation: Yes Length of Intubations (days):  (less then a day) Date extubated: 02/16/17 Behavior/Cognition: Alert;Cooperative Oral Cavity Assessment: Dry Oral Care Completed by SLP: Recent completion by staff Self-Feeding Abilities: Total assist Patient Positioning: Upright in bed Baseline Vocal Quality: Low vocal intensity Volitional Cough: Weak Volitional Swallow: Able to elicit    Oral/Motor/Sensory Function Overall Oral Motor/Sensory Function: Mild impairment Facial ROM: Reduced left Facial Symmetry: Abnormal symmetry left Facial Strength: Reduced left Lingual ROM: Within Functional Limits Lingual Symmetry: Within Functional Limits Lingual Strength: Within Functional Limits Mandible: Within Functional Limits   Ice Chips Ice chips: Not tested   Thin Liquid Thin Liquid: Impaired Presentation: Spoon;Cup Oral Phase Impairments: Poor awareness of bolus Oral Phase Functional Implications: Oral holding Pharyngeal  Phase Impairments: Suspected delayed Swallow;Cough - Delayed;Multiple swallows    Nectar Thick Nectar Thick Liquid: Not tested   Honey Thick Honey Thick Liquid: Not tested   Puree Puree: Impaired Presentation: Spoon Oral Phase Impairments: Impaired mastication Oral Phase Functional  Implications: Prolonged oral transit Pharyngeal Phase Impairments: Suspected delayed Swallow;Multiple swallows   Solid   GO   Solid: Not tested        Shelly Flatten, MA, CCC-SLP Acute Rehab SLP [phone] Lamar Sprinkles 02/17/2017,8:19 AM

## 2017-02-17 NOTE — Progress Notes (Signed)
STROKE TEAM PROGRESS NOTE   HISTORY OF PRESENT ILLNESS (per record) Shelby Jennings is an 83 y.o. female with a past medical history that is significant for HTN, HLD, DM, and reportedly Afib but unclear if taking anticoagulation, who at baseline is quite functional and pretty independent but  brought to the ED as a Code Stroke by [facility] from [facility]. Neighbor saw pt slide out of chair at 1900. Pt with sudden onset of L sided paralysis, slurred speech, R sided gaze. Code Stroke called. No family present at bedside and unsuccessful attempt to contact her brother or other family member to obtain further information. She is currently awake, very dysarthric, mildly confused, with dense L Hemiparesis, L face droop, and R gaze preference. STAT CT head showed no acute abnormality, ASPECTS 10, but evidence of a large 2. 3 cm extra-axial mass along the left anterior clinoid, partly calcified and most consistent with a meningioma.as well as probable additional 17 x 8 mm left posterior fossa meningioma . CTA head revealed R M1 occlusion with poor early collaterals.   Date last known well: 02/15/17 Time last known well: 7 pm tPA Given: no, question of a fib on the chart and unknown if patient taking anticoagulation. NIHSS: 24 MRS: 0   SUBJECTIVE (INTERVAL HISTORY) No family members present. The patient was extubated yesterday and has done well. She has not yet passed swallow eval. MRI scan of the brain shows only tiny patchy scattered right MCA infarcts OBJECTIVE Temp:  [97.7 F (36.5 C)-99.3 F (37.4 C)] 98.1 F (36.7 C) (07/08 0800) Pulse Rate:  [39-119] 66 (07/08 1100) Cardiac Rhythm: Normal sinus rhythm (07/07 2000) Resp:  [13-22] 19 (07/08 1100) BP: (106-151)/(59-113) 130/79 (07/08 1100) SpO2:  [94 %-100 %] 100 % (07/08 1100) Arterial Line BP: (85-119)/(54-90) 107/90 (07/07 2000)  CBC:   Recent Labs Lab 02/16/17 0015 02/17/17 0157  WBC 7.8 9.4  NEUTROABS 5.2 4.8  HGB 11.8* 13.5  HCT  36.1 41.6  MCV 93.8 94.8  PLT 191 734    Basic Metabolic Panel:   Recent Labs Lab 02/16/17 0015 02/16/17 0427 02/17/17 0157  NA  --  140 140  K  --  3.8 3.3*  CL  --  112* 107  CO2  --  22 24  GLUCOSE  --  135* 127*  BUN  --  8 <5*  CREATININE  --  0.79 0.68  CALCIUM  --  8.1* 8.5*  MG 1.7  --  2.0  PHOS  --  2.7 2.9    Lipid Panel:     Component Value Date/Time   CHOL 164 02/16/2017 0015   TRIG 127 02/16/2017 0015   HDL 29 (L) 02/16/2017 0015   CHOLHDL 5.7 02/16/2017 0015   VLDL 25 02/16/2017 0015   LDLCALC 110 (H) 02/16/2017 0015   HgbA1c:  Lab Results  Component Value Date   HGBA1C 7.2 (H) 02/16/2017   Urine Drug Screen: No results found for: LABOPIA, COCAINSCRNUR, LABBENZ, AMPHETMU, THCU, LABBARB  Alcohol Level No results found for: ETH  IMAGING  Ct Angio Head W Or Wo Contrast Ct Angio Neck W Or Wo Contrast 02/15/2017 1. Emergent large vessel occlusion with right M1 clot.  2. Relatively mild atherosclerosis in the neck without flow limiting stenosis. Bilateral V3 segments and ICAs are somewhat irregular, question FMD.  3. Advanced intracranial atherosclerosis with high-grade left M2 and bilateral P2 segment stenoses. 4. Meningiomas as described on prior noncontrast study.  5. 26 mm left thyroid mass.  Ct Head Wo Contrast 02/15/2017 1. Probable subtle early changes of evolving acute ischemia involving the right lentiform nucleus.  2. No other acute intracranial process identified. No complication status post catheter directed revascularization.    Dg Chest Port 1 View 02/16/2017 1. Endotracheal tube in good position.  2. Mild retrocardiac atelectasis.    Ct Head Code Stroke W/o Cm 02/15/2017 1. No acute finding. ASPECTS is 10.  2. 3 cm extra-axial mass along the left anterior clinoid, partly calcified and most consistent with a meningioma.  3. Probable additional 17 x 8 mm left posterior fossa meningioma.    Interventional Radiology - Dr  Earleen Newport 02/16/2017 Status post right cerebral angiogram and mechanical thrombectomy of M1 occlusion contributing to acute right MCA syndrome with restoration of TICI 3 flow.   PHYSICAL EXAM Elderly african [place] lady who is not in distress. . Afebrile. Head is nontraumatic. Neck is supple without bruit.    Cardiac exam no murmur or gallop. Lungs are clear to auscultation. Distal pulses are well felt. Neurological Exam :  Awake alert and interactive dysarthria but can be understood. No aphasia. Follows commands well.. Right gaze preference  Decreased blink to thr threat on the left compared to the right. Pupils equal reactive Follows simple midline commands and moves all 4 extremities against gravity. Mild left hemiparesis 4/5.Marland Kitchen Deep tendon reflexes are symmetric. Plantars downgoing sensation is symmetric bilaterally.     ASSESSMENT/PLAN [name] is a 83 y.o. female with history of osteoarthritis, hypertension, hyperlipidemia, atrial fibrillation (unknown anticoagulation status) and diabetes mellitus presenting with dysarthria, confusion, left hemiparesis, left facial droop, and right gaze preference. She did not receive IV t-PA as it was unclear if she was on anticoagulation prior to admission. The patient underwent thrombectomy as noted above.  Stroke:  Right middle cerebral artery due to right M1 occlusion status post recanalization with mechanical embolectomy- etiology likely embolic - probably due to atrial fibrillation.  Resultant  Mild left hemiparesis  CT head - Probable subtle early changes of evolving acute ischemia involving the right lentiform nucleus.  MRI head - Motion degraded examination. Acute to possibly subacute multifocal small RIGHT MCA territory infarcts without hemorrhagic conversion.Moderate chronic small vessel ischemic disease. Old LEFT basal ganglia and RIGHT cerebellar small infarcts. 2.8 x 2.1 cm LEFT paraclinoid meningioma.  MRA head - not  performed  CTA H&N - Right M1 occlusion with clot. High-grade left M2 and bilateral P2 segment stenoses.   Carotid Doppler - CTA neck  2D Echo -  pending  LDL - 110  HgbA1c - 7.2  VTE prophylaxis - subcutaneous heparin Diet NPO time specified  No antithrombotic prior to admission, now on No antithrombotic -> ASA suppository for now.  Patient will be counseled to be compliant with her antithrombotic medications  Ongoing aggressive stroke risk factor management  Therapy recommendations:  pending  Disposition: Pending  Hypertension  Stable  Per interventional radiology -> SBP goal 120 to 140  Long-term BP goal normotensive  Hyperlipidemia  Home meds:  No lipid lowering medications prior to admission  LDL 110, goal < 70  Add Lipitor when taking POs  Continue statin at discharge  Diabetes  HgbA1c 7.2, goal < 7.0  Unc / Controlled  Other Stroke Risk Factors  Advanced age  Overweight, Body mass index is 25.45 kg/m., recommend weight loss, diet and exercise as appropriate    Other Active Problems  26 mm left thyroid mass (incidental finding)  Meningiomas (incidental finding)  High-grade left M2  and bilateral P2 segment stenoses.   Hospital day # 2  I have personally examined this patient, reviewed notes, independently viewed imaging studies, participated in medical decision making and plan of care.ROS completed by me personally and pertinent positives fully documented  I have made any additions or clarifications directly to the above note.  She presented with right M1 occlusion and underwent successful mechanical embolectomy and appears to be doing well. . Continue strict BP control  with systolic goal between [id number]. and close neurological monitoring.Continue aspirin.  Mobilize out of bed. Physical occupational and speech therapy consults. Transfer to the floor bed later today No family available at bedside for discussion. Discuss with Dr. Ashok Cordia This  patient is critically ill and at significant risk of neurological worsening, death and care requires constant monitoring of vital signs, hemodynamics,respiratory and cardiac monitoring, extensive review of multiple databases, frequent neurological assessment, discussion with family, other specialists and medical decision making of high complexity.I have made any additions or clarifications directly to the above note.This critical care time does not reflect procedure time, or teaching time or supervisory time of PA/NP/Med Resident etc but could involve care discussion time.  I spent 30 minutes of neurocritical care time  in the care of  this patient.     Antony Contras, MD Medical Director [place] Pager: [phone] 02/17/2017 11:34 AM   To contact Stroke Continuity provider, please refer to [email or web address] After hours, contact General Neurology

## 2017-02-18 ENCOUNTER — Encounter (HOSPITAL_COMMUNITY): Payer: Self-pay | Admitting: Interventional Radiology

## 2017-02-18 DIAGNOSIS — I10 Essential (primary) hypertension: Secondary | ICD-10-CM

## 2017-02-18 DIAGNOSIS — E785 Hyperlipidemia, unspecified: Secondary | ICD-10-CM

## 2017-02-18 DIAGNOSIS — E119 Type 2 diabetes mellitus without complications: Secondary | ICD-10-CM

## 2017-02-18 DIAGNOSIS — I63511 Cerebral infarction due to unspecified occlusion or stenosis of right middle cerebral artery: Secondary | ICD-10-CM

## 2017-02-18 DIAGNOSIS — I480 Paroxysmal atrial fibrillation: Secondary | ICD-10-CM

## 2017-02-18 DIAGNOSIS — I69391 Dysphagia following cerebral infarction: Secondary | ICD-10-CM

## 2017-02-18 LAB — CBC WITH DIFFERENTIAL/PLATELET
BASOS ABS: 0 10*3/uL (ref 0.0–0.1)
BASOS PCT: 0 %
EOS ABS: 0.1 10*3/uL (ref 0.0–0.7)
EOS PCT: 2 %
HCT: 45 % (ref 36.0–46.0)
Hemoglobin: 14.3 g/dL (ref 12.0–15.0)
Lymphocytes Relative: 36 %
Lymphs Abs: 3.2 10*3/uL (ref 0.7–4.0)
MCH: 30.6 pg (ref 26.0–34.0)
MCHC: 31.8 g/dL (ref 30.0–36.0)
MCV: 96.4 fL (ref 78.0–100.0)
Monocytes Absolute: 0.7 10*3/uL (ref 0.1–1.0)
Monocytes Relative: 8 %
Neutro Abs: 4.7 10*3/uL (ref 1.7–7.7)
Neutrophils Relative %: 54 %
Platelets: 234 10*3/uL (ref 150–400)
RBC: 4.67 MIL/uL (ref 3.87–5.11)
RDW: 14.2 % (ref 11.5–15.5)
WBC: 8.7 10*3/uL (ref 4.0–10.5)

## 2017-02-18 LAB — RENAL FUNCTION PANEL
ALBUMIN: 3.1 g/dL — AB (ref 3.5–5.0)
Anion gap: 7 (ref 5–15)
BUN: 5 mg/dL — AB (ref 6–20)
CALCIUM: 8.7 mg/dL — AB (ref 8.9–10.3)
CO2: 25 mmol/L (ref 22–32)
CREATININE: 0.77 mg/dL (ref 0.44–1.00)
Chloride: 107 mmol/L (ref 101–111)
GFR calc Af Amer: >60 mL/min (ref >60)
Glucose, Bld: 120 mg/dL — ABNORMAL HIGH (ref 65–99)
PHOSPHORUS: 3.6 mg/dL (ref 2.5–4.6)
Potassium: 3.8 mmol/L (ref 3.5–5.1)
SODIUM: 139 mmol/L (ref 135–145)

## 2017-02-18 LAB — GLUCOSE, CAPILLARY
GLUCOSE-CAPILLARY: 112 mg/dL — AB (ref 65–99)
GLUCOSE-CAPILLARY: 129 mg/dL — AB (ref 65–99)
Glucose-Capillary: 108 mg/dL — ABNORMAL HIGH (ref 65–99)
Glucose-Capillary: 167 mg/dL — ABNORMAL HIGH (ref 65–99)
Glucose-Capillary: 154 mg/dL — ABNORMAL HIGH (ref 65–99)

## 2017-02-18 LAB — MAGNESIUM: MAGNESIUM: 1.9 mg/dL (ref 1.7–2.4)

## 2017-02-18 MED ORDER — ORAL CARE MOUTH RINSE
15.0000 mL | Freq: Two times a day (BID) | OROMUCOSAL | Status: DC
  Administered 2017-02-18 – 2017-02-20 (×3): 15 mL via OROMUCOSAL

## 2017-02-18 MED ORDER — ASPIRIN 325 MG PO TABS
325.0000 mg | ORAL_TABLET | Freq: Every day | ORAL | Status: DC
  Administered 2017-02-18 – 2017-02-20 (×3): 325 mg via ORAL
  Filled 2017-02-18 (×3): qty 1

## 2017-02-18 MED ORDER — ATORVASTATIN CALCIUM 10 MG PO TABS
20.0000 mg | ORAL_TABLET | Freq: Every day | ORAL | Status: DC
  Administered 2017-02-18 – 2017-02-19 (×2): 20 mg via ORAL
  Filled 2017-02-18 (×2): qty 1

## 2017-02-18 NOTE — Consult Note (Signed)
Physical Medicine and Rehabilitation Consult Reason for Consult: Left sided weakness and slurred speech Referring Physician: Dr. Leonie Man   HPI: Shelby Jennings is a 83 y.o. right handed female with history of diabetes mellitus, hyperlipidemia, hypertension and atrial fibrillation unclear if on anticoagulation. Per chart review and patient, patient lives alone at [facility] and used a single-point cane prior to admission. Patient does not drive. She does have neighbors who check on her but no immediate family that can assist. Presented 02/15/2017 with left-sided weakness and slurred speech. Initial cranial CT scan negative. Noted 3 cm extra-axial mass along the left anterior clinoid early calcified most consistent with meningioma. CT angiogram head and neck showed emergent large vessel occlusion with right M1 clot. Underwent cerebral angiogram with mechanical thrombectomy per interventional radiology. Follow-up MRI reviewed, showing multiple acute right infarcts with left anterior hemorrhage. Per report, CT/MRI showed acute to possibly subacute multifocal right MCA territory infarct without hemorrhagic conversion. Old left basal ganglia and right cerebellar small infarcts. Echocardiogram with ejection fraction of 19% grade 1 diastolic dysfunction. Currently maintained on aspirin for CVA prophylaxis. Subcutaneous heparin for DVT prophylaxis. Dysphagia #1 nectar thick liquid diet. Physical therapy evaluation completed with recommendations of physical medicine rehabilitation consult.   Review of Systems  Constitutional: Negative for chills and fever.  HENT: Negative for hearing loss.   Eyes: Negative for blurred vision and double vision.  Respiratory: Negative for cough and shortness of breath.   Cardiovascular: Positive for palpitations and leg swelling. Negative for chest pain.  Gastrointestinal: Positive for constipation. Negative for nausea and vomiting.  Genitourinary: Negative for  dysuria, flank pain and hematuria.  Musculoskeletal: Positive for joint pain and myalgias.  Skin: Negative for rash.  Neurological: Positive for speech change and weakness. Negative for seizures.  All other systems reviewed and are negative.  Past Medical History:  Diagnosis Date  . DIABETES MELLITUS, TYPE II 06/24/2009  . HYPERLIPIDEMIA 05/09/2007  . HYPERTENSION 05/09/2007  . Morbid obesity ([place])   . OSTEOARTHRITIS 05/09/2007   Past Surgical History:  Procedure Laterality Date  . IR PERCUTANEOUS ART THROMBECTOMY/INFUSION INTRACRANIAL INC DIAG ANGIO  02/15/2017  . IR US GUIDE VASC ACCESS RIGHT  02/15/2017   Family History  Problem Relation Age of Onset  . Cancer Mother        had uncertain type of cancer   Social History:  reports that she has never smoked. She has never used smokeless tobacco. She reports that she does not drink alcohol or use drugs. Allergies:  Allergies  Allergen Reactions  . Atorvastatin Other (See Comments)    Myalgia    Medications Prior to Admission  Medication Sig Dispense Refill  . celecoxib (CELEBREX) 200 MG capsule Take 1 capsule (200 mg total) by mouth daily. 90 capsule 0  . cyanocobalamin (,VITAMIN B-12,) 1000 MCG/ML injection Inject 1,000 mcg into the muscle every 30 (thirty) days. Reported on 10/31/2015    . triamcinolone (KENALOG) 0.025 % cream APPLY TOPICALLY AS NEEDED. 80 g 1    Home: Home Living Family/patient expects to be discharged to:: Private residence Living Arrangements: Alone Available Help at Discharge: Friend(s), Available PRN/intermittently Type of Home: Apartment Home Access: Elevator Home Layout: One level Home Equipment: Kasandra Knudsen - single point  Lives With: Alone  Functional History: Prior Function Level of Independence: Needs assistance Gait / Transfers Assistance Needed: Pt uses a cane "when I need it" occasionally, does not drive (she takes a cab or neighbors will drive her if  she pays).   Communication / Swallowing  Assistance Needed: hoarse voice post intubation.  Comments: She has family, but seems a bit estranged from them.  This will need to be investigated further.  Functional Status:  Mobility: Bed Mobility Overal bed mobility: Needs Assistance Bed Mobility: Supine to Sit Supine to sit: Mod assist, HOB elevated General bed mobility comments: Mod assist to help progress left arm and leg to EOB.  Assist at trunk (although pt is trying to pull trunk up using right hand on bedrail) to get to upright sitting, pt using posterior leaning strategy to scoot hips forward to EOB, therapist assisting in reciprocal weight shifting.  Initally leaning right and posteriorly.  Transfers Overall transfer level: Needs assistance Equipment used: 1 person hand held assist, 2 person hand held assist Transfers: Sit to/from Stand, Risk manager, Squat Pivot Transfers Sit to Stand: +2 physical assistance, Mod assist Stand pivot transfers: +2 physical assistance, Mod assist Squat pivot transfers: +2 physical assistance, Mod assist General transfer comment: One person mod assist to initally squat pivot to drop arm recliner chair on pt's right side.  However, when pt verbalized the need to use the [facility], therapist called in extra hands for safety in stand pivot towards her right to [facility].  Inital stand is good, but pt had difficulty moving feet and turning hips around to [facility].  "I'm going to fall" and pushing back to the left when attempting to move to her right.  She did better transferring back to the recliner chair on her right likely due to her tendancy to push right.  Transferring right from [facility] still required two person mod assist.        ADL:    Cognition: Cognition Overall Cognitive Status: Impaired/Different from baseline Arousal/Alertness: Awake/alert Orientation Level: Oriented to person, Oriented to place, Disoriented to time, Disoriented to situation Attention: Focused Focused Attention: Impaired Focused  Attention Impairment: Verbal complex Memory: Impaired Memory Impairment: Decreased recall of new information Problem Solving: Impaired Problem Solving Impairment: Verbal basic Cognition Arousal/Alertness: Awake/alert Behavior During Therapy: WFL for tasks assessed/performed Overall Cognitive Status: Impaired/Different from baseline Area of Impairment: Attention, Memory, Following commands, Safety/judgement, Awareness, Problem solving, Orientation Orientation Level: Disoriented to, Situation Current Attention Level: Sustained Memory: Decreased short-term memory Following Commands: Follows one step commands consistently Safety/Judgement: Decreased awareness of safety, Decreased awareness of deficits Awareness: Intellectual Problem Solving: Slow processing, Difficulty sequencing, Requires verbal cues, Requires tactile cues General Comments: Pt with decreased awareness of her left sided weakness, right gaze, but able to cross midline with cues.  She was not quick with offering up stroke, but when repeated later in the session, she did report she had a stroke.    Blood pressure (!) 172/82, pulse 72, temperature 98.1 F (36.7 C), temperature source Axillary, resp. rate (!) 22, height 5\' 7"  (1.702 m), weight 73.7 kg (162 lb 7.7 oz), SpO2 100 %. Physical Exam  Vitals reviewed. Constitutional: She appears well-developed.  Frail  HENT:  Head: Normocephalic and atraumatic.  Eyes: EOM are normal. Right eye exhibits no discharge. Left eye exhibits no discharge.  Neck: Normal range of motion. Neck supple. No thyromegaly present.  Cardiovascular: Normal rate and regular rhythm.   Respiratory: Effort normal and breath sounds normal. No respiratory distress.  GI: Soft. Bowel sounds are normal. She exhibits no distension.  Musculoskeletal: She exhibits no edema or tenderness.  Neurological: She is alert.  Follows basic commands.  Makes good eye contact with examiner.  She is able to  provide her  name age and place, not time. Motor: RUE/RLE 4/5 proximal to distal LUE/LLE: 4-5/proximal to distal  Skin: Skin is warm and dry.  Psychiatric: She has a normal mood and affect. Her behavior is normal.    Results for orders placed or performed during the hospital encounter of 02/15/17 (from the past 24 hour(s))  Glucose, capillary     Status: Abnormal   Collection Time: 02/17/17  7:58 AM  Result Value Ref Range   Glucose-Capillary 123 (H) 65 - 99 mg/dL  Glucose, capillary     Status: Abnormal   Collection Time: 02/17/17 11:54 AM  Result Value Ref Range   Glucose-Capillary 110 (H) 65 - 99 mg/dL  Glucose, capillary     Status: Abnormal   Collection Time: 02/17/17  3:15 PM  Result Value Ref Range   Glucose-Capillary 124 (H) 65 - 99 mg/dL  Glucose, capillary     Status: Abnormal   Collection Time: 02/17/17  8:28 PM  Result Value Ref Range   Glucose-Capillary 107 (H) 65 - 99 mg/dL  Glucose, capillary     Status: Abnormal   Collection Time: 02/17/17 11:52 PM  Result Value Ref Range   Glucose-Capillary 104 (H) 65 - 99 mg/dL  Glucose, capillary     Status: Abnormal   Collection Time: 02/18/17  3:10 AM  Result Value Ref Range   Glucose-Capillary 112 (H) 65 - 99 mg/dL  Renal function panel     Status: Abnormal   Collection Time: 02/18/17  3:59 AM  Result Value Ref Range   Sodium 139 135 - 145 mmol/L   Potassium 3.8 3.5 - 5.1 mmol/L   Chloride 107 101 - 111 mmol/L   CO2 25 22 - 32 mmol/L   Glucose, Bld 120 (H) 65 - 99 mg/dL   BUN 5 (L) 6 - 20 mg/dL   Creatinine, Ser 0.77 0.44 - 1.00 mg/dL   Calcium 8.7 (L) 8.9 - 10.3 mg/dL   Phosphorus 3.6 2.5 - 4.6 mg/dL   Albumin 3.1 (L) 3.5 - 5.0 g/dL   GFR calc non Af Amer >60 >60 mL/min   GFR calc Af Amer >60 >60 mL/min   Anion gap 7 5 - 15  CBC with Differential/Platelet     Status: None   Collection Time: 02/18/17  3:59 AM  Result Value Ref Range   WBC 8.7 4.0 - 10.5 K/uL   RBC 4.67 3.87 - 5.11 MIL/uL   Hemoglobin 14.3 12.0 - 15.0 g/dL    HCT 45.0 36.0 - 46.0 %   MCV 96.4 78.0 - 100.0 fL   MCH 30.6 26.0 - 34.0 pg   MCHC 31.8 30.0 - 36.0 g/dL   RDW 14.2 11.5 - 15.5 %   Platelets 234 150 - 400 K/uL   Neutrophils Relative % 54 %   Neutro Abs 4.7 1.7 - 7.7 K/uL   Lymphocytes Relative 36 %   Lymphs Abs 3.2 0.7 - 4.0 K/uL   Monocytes Relative 8 %   Monocytes Absolute 0.7 0.1 - 1.0 K/uL   Eosinophils Relative 2 %   Eosinophils Absolute 0.1 0.0 - 0.7 K/uL   Basophils Relative 0 %   Basophils Absolute 0.0 0.0 - 0.1 K/uL  Magnesium     Status: None   Collection Time: 02/18/17  3:59 AM  Result Value Ref Range   Magnesium 1.9 1.7 - 2.4 mg/dL   Mr Brain Wo Contrast  Result Date: 02/16/2017 CLINICAL DATA:  Followup stroke, status post mechanical thrombectomy of  RIGHT M1 occlusion February 15, 2017. EXAM: MRI HEAD WITHOUT CONTRAST TECHNIQUE: Multiplanar, multiecho pulse sequences of the brain and surrounding structures were obtained without intravenous contrast. COMPARISON:  CT HEAD February 15, 2017 and CTA HEAD February 15, 2017. FINDINGS: Mild to moderately motion degraded examination. BRAIN: Numerous areas of reduced diffusion within RIGHT temporal lobe, RIGHT occipital lobe, RIGHT frontal lobe, largest within RIGHT basal ganglia measuring 13 x 7 mm. Areas demonstrate normalized to mildly decreased ADC values. No susceptibility artifact to suggest hemorrhagic conversion though SWAN sequences mildly motion degraded. Ventricles and sulci are normal for patient's age. Old LEFT basal ganglia lacunar infarct. Patchy to confluent supratentorial white matter T2 hyperintensities. Old small RIGHT cerebellar infarcts. No abnormal extra-axial fluid collections. Low signal 2.8 x 2.1 cm mass contiguous with the LEFT anterior clinoid. VASCULAR: Normal major intracranial vascular flow voids present at skull base. SKULL AND UPPER CERVICAL SPINE: No abnormal sellar expansion. No suspicious calvarial bone marrow signal. Craniocervical junction maintained.  SINUSES/ORBITS: The mastoid air-cells and included paranasal sinuses are well-aerated. The included ocular globes and orbital contents are non-suspicious. OTHER: None. IMPRESSION: 1. Motion degraded examination. Acute to possibly subacute multifocal small RIGHT MCA territory infarcts without hemorrhagic conversion. 2. Moderate chronic small vessel ischemic disease. Old LEFT basal ganglia and RIGHT cerebellar small infarcts. 3. 2.8 x 2.1 cm LEFT paraclinoid meningioma. Electronically Signed   By: Elon Alas M.D.   On: 02/16/2017 17:06   Dg Chest Port 1 View  Result Date: 02/16/2017 CLINICAL DATA:  Acute respiratory failure with hypoxia. EXAM: PORTABLE CHEST 1 VIEW COMPARISON:  None. FINDINGS: Endotracheal tube tip between the clavicular heads and carina. Mild retrocardiac opacity, likely atelectasis in this setting. No edema, effusion, or pneumothorax. Normal heart size and mediastinal contours. IMPRESSION: 1. Endotracheal tube in good position. 2. Mild retrocardiac atelectasis. Electronically Signed   By: Monte Fantasia M.D.   On: 02/16/2017 07:18   Dg Swallowing Func-speech Pathology  Result Date: 02/17/2017 Objective Swallowing Evaluation: Type of Study: MBS-Modified Barium Swallow Study Patient Details Name: Shelby Jennings MRN: [id number] Date of Birth: 02/12/1924 Today's Date: 02/17/2017 Time: SLP Start Time (ACUTE ONLY): 1158-SLP Stop Time (ACUTE ONLY): 1223 SLP Time Calculation (min) (ACUTE ONLY): 25 min Past Medical History: Past Medical History: Diagnosis Date . DIABETES MELLITUS, TYPE II 06/24/2009 . HYPERLIPIDEMIA 05/09/2007 . HYPERTENSION 05/09/2007 . Morbid obesity ([place])  . OSTEOARTHRITIS 05/09/2007 Past Surgical History: Past Surgical History: Procedure Laterality Date . IR PERCUTANEOUS ART THROMBECTOMY/INFUSION INTRACRANIAL INC DIAG ANGIO  02/15/2017 . IR US GUIDE VASC ACCESS RIGHT  02/15/2017 HPI: Shawnay Bramel an 83 y.o.femalewith a past medical history that is significant for HTN, HLD, DM, and  reportedly [place] unclear if taking anticoagulation, who at baseline is quite functional and pretty independent but brought to the ED as a Code Stroke by [facility] from [facility]. Neighbor saw pt slide out of chair at 1900. Pt with sudden onset of L sidedparalysis, slurred speech, R sided gaze. Code Stroke called.  No family present at bedside and unsuccessful attempt to contact her brother or other family member to obtain further information.  She is currently awake, very dysarthric, mildly confused, with dense L Hemiparesis, L face droop, and R gaze preference.  STAT CT head showed no acute abnormality, ASPECTS 10, but evidence of a large 2. 3 cm extra-axial mass along the left anterior clinoid, partly calcified and most consistent with a meningioma.as well as probable additional 17 x 8 mm left posterior fossa meningioma.  CTA head revealed  R M1 occlusion withpoor early collaterals.   Patient is s/p thrombectomy.  MRI is showing acute to subacute right MCA territory infarcts and old left basal ganglia and right cerebellar infarcts.   Subjective: The patient was seen in radiology to determine current swallowing physiology and least restrictive diet.  Assessment / Plan / Recommendation CHL IP CLINICAL IMPRESSIONS 02/17/2017 Clinical Impression MBS was completed using thin liquids, nectar thick liquids and pureed material.  The patient presented with oropharyngeal dysphagia.  The oral phase was characterized by oral holding across all textures with premature loss of the bolus which was seen primarily given pureed material.  The oral holding was significant given pureed material.  Oral residue was noted across textures.  The pharyngeal phase of the swallow was characterized by a swallow trigger in the valleculae given pureed material and a swallow trigger in the pyriform sinuses given thin and nectar thick liquids.  Silent penetration into the laryngeal vestibule was noted given tsp sips of thin liquids prior to  the swallow.  Silent penetration was seen into the laryngeal vestibule during the swallow given very large cup sips of nectar thick liquids.  Given small patient controlled straw sips it was not seen.  Use of a chin tuck was attempted given tsp sips of thin liquids and was not sucessful to help prevent the penetration.  Esophageal sweep revealed it slow to clear.  Recommend a dysphagia 1 diet with nectar thick liquids.  The patient should take one small sip at a time.  Given performance with pureed material the patient's oral cavity should be checked between bites/sips to ensure oral cavity is clear prior to next bolus presentation.  She may require cues to swallow.  The patient will require ST follow during acute stay and at next level of care.     SLP Visit Diagnosis Dysphagia, oropharyngeal phase (R13.12) Attention and concentration deficit following -- Frontal lobe and executive function deficit following -- Impact on safety and function Mild aspiration risk   CHL IP TREATMENT RECOMMENDATION 02/17/2017 Treatment Recommendations Therapy as outlined in treatment plan below   Prognosis 02/17/2017 Prognosis for Safe Diet Advancement Fair Barriers to Reach Goals -- Barriers/Prognosis Comment -- CHL IP DIET RECOMMENDATION 02/17/2017 SLP Diet Recommendations Dysphagia 1 (Puree) solids;Nectar thick liquid Liquid Administration via Cup Medication Administration Crushed with puree Compensations Slow rate;Small sips/bites;Follow solids with liquid Postural Changes Remain semi-upright after after feeds/meals (Comment);Seated upright at 90 degrees   CHL IP OTHER RECOMMENDATIONS 02/17/2017 Recommended Consults -- Oral Care Recommendations Oral care BID;Oral care before and after PO Other Recommendations Order thickener from pharmacy;Prohibited food (jello, ice cream, thin soups);Have oral suction available   CHL IP FOLLOW UP RECOMMENDATIONS 02/17/2017 Follow up Recommendations Other (comment)   CHL IP FREQUENCY AND DURATION 02/17/2017  Speech Therapy Frequency (ACUTE ONLY) min 2x/week Treatment Duration 2 weeks      CHL IP ORAL PHASE 02/17/2017 Oral Phase Impaired Oral - Pudding Teaspoon -- Oral - Pudding Cup -- Oral - Honey Teaspoon -- Oral - Honey Cup -- Oral - Nectar Teaspoon Holding of bolus;Delayed oral transit Oral - Nectar Cup Delayed oral transit;Holding of bolus Oral - Nectar Straw Delayed oral transit;Holding of bolus Oral - Thin Teaspoon Delayed oral transit;Holding of bolus Oral - Thin Cup -- Oral - Thin Straw -- Oral - Puree Delayed oral transit;Holding of bolus;Premature spillage Oral - Mech Soft -- Oral - Regular -- Oral - Multi-Consistency -- Oral - Pill -- Oral Phase - Comment --  CHL IP  PHARYNGEAL PHASE 02/17/2017 Pharyngeal Phase Impaired Pharyngeal- Pudding Teaspoon -- Pharyngeal -- Pharyngeal- Pudding Cup -- Pharyngeal -- Pharyngeal- Honey Teaspoon -- Pharyngeal -- Pharyngeal- Honey Cup -- Pharyngeal -- Pharyngeal- Nectar Teaspoon Delayed swallow initiation-pyriform sinuses;Delayed swallow initiation-vallecula;Penetration/Aspiration during swallow Pharyngeal Material enters airway, remains ABOVE vocal cords and not ejected out Pharyngeal- Nectar Cup Delayed swallow initiation-pyriform sinuses;Penetration/Aspiration during swallow Pharyngeal Material enters airway, remains ABOVE vocal cords and not ejected out Pharyngeal- Nectar Straw Delayed swallow initiation-pyriform sinuses Pharyngeal -- Pharyngeal- Thin Teaspoon Delayed swallow initiation-pyriform sinuses;Penetration/Aspiration before swallow Pharyngeal Material enters airway, remains ABOVE vocal cords and not ejected out Pharyngeal- Thin Cup -- Pharyngeal -- Pharyngeal- Thin Straw -- Pharyngeal -- Pharyngeal- Puree Delayed swallow initiation-vallecula Pharyngeal -- Pharyngeal- Mechanical Soft -- Pharyngeal -- Pharyngeal- Regular -- Pharyngeal -- Pharyngeal- Multi-consistency -- Pharyngeal -- Pharyngeal- Pill -- Pharyngeal -- Pharyngeal Comment --  CHL IP CERVICAL ESOPHAGEAL  PHASE 02/17/2017 Cervical Esophageal Phase WFL Pudding Teaspoon -- Pudding Cup -- Honey Teaspoon -- Honey Cup -- Nectar Teaspoon -- Nectar Cup -- Nectar Straw -- Thin Teaspoon -- Thin Cup -- Thin Straw -- Puree -- Mechanical Soft -- Regular -- Multi-consistency -- Pill -- Cervical Esophageal Comment -- No flowsheet data found. Shelly Flatten, MA, CCC-SLP Acute Rehab SLP [phone] Lamar Sprinkles 02/17/2017, 12:51 PM               Assessment/Plan: Diagnosis: Multifocal right MCA territory infarct  Labs and images independently reviewed.  Records reviewed and summated above. Stroke: Continue secondary stroke prophylaxis and Risk Factor Modification listed below:   Antiplatelet therapy:   Blood Pressure Management:  Continue current medication with prn's with permisive HTN per primary team Statin Agent:   Diabetes management:   Left sided hemiparesis: fit for orthosis to prevent contractures (resting hand splint for day, wrist cock up splint at night, PRAFO, etc) Motor recovery: Fluoxetine  1. Does the need for close, 24 hr/day medical supervision in concert with the patient's rehab needs make it unreasonable for this patient to be served in a less intensive setting? Yes  2. Co-Morbidities requiring supervision/potential complications: diabetes mellitus (Monitor in accordance with exercise and adjust meds as necessary), hyperlipidemia (cont meds), HTN (monitor and provide prns in accordance with increased physical exertion and pain), atrial fibrillation (monitor HR with increased physical activity), diastolic dysfunction (monitor for signs/symptoms of fluid overload), Dysphagia (advance diet as tolerated), hypoalbuminemia (maximize nutrition for overall health and wound healing) 3. Due to safety, disease management, medication administration and patient education, does the patient require 24 hr/day rehab nursing? Yes 4. Does the patient require coordinated care of a physician, rehab nurse, PT (1-2  hrs/day, 5 days/week), OT (1-2 hrs/day, 5 days/week) and SLP (1-2 hrs/day, 5 days/week) to address physical and functional deficits in the context of the above medical diagnosis(es)? Yes Addressing deficits in the following areas: balance, endurance, locomotion, strength, transferring, bathing, dressing, toileting, cognition and psychosocial support 5. Can the patient actively participate in an intensive therapy program of at least 3 hrs of therapy per day at least 5 days per week? Yes 6. The potential for patient to make measurable gains while on inpatient rehab is excellent 7. Anticipated functional outcomes upon discharge from inpatient rehab are supervision and min assist  with PT, supervision and min assist with OT, supervision with SLP. 8. Estimated rehab length of stay to reach the above functional goals is: 17-20 days. 9. Anticipated D/C setting: Other 10. Anticipated post D/C treatments: SNF 11. Overall Rehab/Functional Prognosis: good  RECOMMENDATIONS:  This patient's condition is appropriate for continued rehabilitative care in the following setting: Recommend CIR, however, pt states she does not want to stay in the hospital and would like to go home.   Patient has agreed to participate in recommended program. Potentially Note that insurance prior authorization may be required for reimbursement for recommended care.  Comment: Rehab Admissions Coordinator to follow up.  Delice Lesch, MD, Mellody Drown Cathlyn Parsons., PA-C 02/18/2017

## 2017-02-18 NOTE — Evaluation (Signed)
Occupational Therapy Evaluation Patient Details Name: Shelby Jennings MRN: [id number] DOB: [birth year] Today's Date: 02/18/2017    History of Present Illness 83 y.o. female admitted on 02/15/17 for left sided weakness and slurred speech. CTA head revealed R M1 occlusion and pt underwent clot retrieval in IR on day of admission.  She was intubated post proceedure and extuabted 02/16/17.  Pt with significant PMH of OA, DM 2, and HTN.     Clinical Impression   PTA, pt was independent with assistive devices for basic ADL and functional mobility. She does not drive and lives alone. Pt currently requires overall mod assist +2 for toilet transfers and mod assist for basic ADL participation at this time. She presents with decreased functional use of L UE, decreased awareness, and poor seated and standing balance impacting her ability to participate in ADL at PLOF. Pt would benefit from continued OT services while admitted to improve independence with ADL and functional mobility. Recommend CIR level therapies post-acute D/C to maximize return to independence. Vital signs stable throughout session.     Follow Up Recommendations  CIR;Supervision/Assistance - 24 hour    Equipment Recommendations  Other (comment) (TBD)    Recommendations for Other Services       Precautions / Restrictions Precautions Precautions: Fall Precaution Comments: left side weak and inattantion.  Restrictions Weight Bearing Restrictions: No      Mobility Bed Mobility Overal bed mobility: Needs Assistance Bed Mobility: Rolling;Sidelying to Sit Rolling: Mod assist Sidelying to sit: Mod assist;+2 for physical assistance       General bed mobility comments: Bed pad used to assist in rolling, and +2 assist provided for transition to sitting position. Increased time required for pt to gain/maintain sitting balance, and a posterior lean was noted. Noted decreased pushing to the R compared to initial eval.   Transfers Overall  transfer level: Needs assistance Equipment used: 2 person hand held assist Transfers: Sit to/from [facility] Sit to Stand: Mod assist;+2 physical assistance Stand pivot transfers: Mod assist;+2 physical assistance       General transfer comment: +2 assist provided for power-up to full standing position. Increased time required for narrowing BOS, and assist provided for L foot adduction. Pt was able to take a few steps around to the chair with assist provided for weight shift and advancing feet at times (mainly LLE).     Balance Overall balance assessment: Needs assistance Sitting-balance support: Feet supported;Single extremity supported Sitting balance-Leahy Scale: Poor Sitting balance - Comments: Able to maintain midline positioning with min assist  Postural control: Posterior lean Standing balance support: Bilateral upper extremity supported Standing balance-Leahy Scale: Zero Standing balance comment: +2 required.                            ADL either performed or assessed with clinical judgement   ADL Overall ADL's : Needs assistance/impaired     Grooming: Moderate assistance;Sitting   Upper Body Bathing: Moderate assistance;Sitting   Lower Body Bathing: Maximal assistance;Sit to/from stand   Upper Body Dressing : Moderate assistance;Sitting   Lower Body Dressing: Maximal assistance;Sit to/from stand   Toilet Transfer: Moderate assistance;+2 for physical assistance;Stand-pivot   Toileting- Clothing Manipulation and Hygiene: Maximal assistance;Sit to/from stand;+2 for physical assistance       Functional mobility during ADLs: Moderate assistance;+2 for physical assistance (stand-pivot only) General ADL Comments: Pt requiring multimodal cues at times for participation.      Vision   Vision  Assessment?: Vision impaired- to be further tested in functional context Additional Comments: Pt able to cross midline but does prefer R gaze.       Perception     Praxis      Pertinent Vitals/Pain Pain Assessment: No/denies pain     Hand Dominance Right   Extremity/Trunk Assessment Upper Extremity Assessment Upper Extremity Assessment: LUE deficits/detail LUE Deficits / Details: Decreased strength 2-/5 at shoulder; 3/5 at elbow and grasp.    Lower Extremity Assessment Lower Extremity Assessment: Defer to PT evaluation   Cervical / Trunk Assessment Cervical / Trunk Assessment: Normal   Communication Communication Communication: No difficulties   Cognition Arousal/Alertness: Awake/alert Behavior During Therapy: WFL for tasks assessed/performed Overall Cognitive Status: Impaired/Different from baseline Area of Impairment: Attention;Memory;Following commands;Safety/judgement;Awareness;Problem solving;Orientation                 Orientation Level: Disoriented to;Situation;Time Current Attention Level: Sustained Memory: Decreased short-term memory Following Commands: Follows one step commands consistently;Follows multi-step commands inconsistently Safety/Judgement: Decreased awareness of safety;Decreased awareness of deficits Awareness: Intellectual Problem Solving: Slow processing;Difficulty sequencing;Requires verbal cues;Requires tactile cues General Comments: Pt not oriented to time or situation this session. Slow processing noted but did cross midline and attend to L side.   General Comments       Exercises     Shoulder Instructions      Home Living Family/patient expects to be discharged to:: Private residence Living Arrangements: Alone Available Help at Discharge: Friend(s);Available PRN/intermittently Type of Home: Apartment Home Access: Elevator     Home Layout: One level     Bathroom Shower/Tub:  (does "wash ups" only)         Home Equipment: Cane - single point      Lives With: Alone    Prior Functioning/Environment Level of Independence: Needs assistance  Gait / Transfers  Assistance Needed: Pt uses a cane "when I need it" occasionally, does not drive (she takes a cab or neighbors will drive her if she pays).     Communication / Swallowing Assistance Needed: hoarse voice post intubation.  Comments: She has family, but seems a bit estranged from them.  This will need to be investigated further.         OT Problem List: Decreased strength;Decreased activity tolerance;Impaired balance (sitting and/or standing);Decreased safety awareness;Decreased knowledge of use of DME or AE;Decreased cognition;Decreased coordination;Impaired UE functional use;Impaired vision/perception      OT Treatment/Interventions: Self-care/ADL training;Therapeutic exercise;Energy conservation;DME and/or AE instruction;Therapeutic activities;Patient/family education;Balance training;Cognitive remediation/compensation;Visual/perceptual remediation/compensation    OT Goals(Current goals can be found in the care plan section) Acute Rehab OT Goals Patient Stated Goal: to get back to her independence OT Goal Formulation: With patient Time For Goal Achievement: 03/04/17 Potential to Achieve Goals: Good ADL Goals Pt Will Perform Grooming: with min guard assist;sitting Pt Will Transfer to Toilet: with min assist;stand pivot transfer;bedside commode Pt Will Perform Toileting - Clothing Manipulation and hygiene: with min assist;sit to/from stand Pt/caregiver will Perform Home Exercise Program: Left upper extremity;With written HEP provided;Increased strength;Increased ROM Additional ADL Goal #1: Pt will complete bed mobility in preparation for ADL seated at EOB with overal min assist.   OT Frequency: Min 3X/week   Barriers to D/C:            Co-evaluation PT/OT/SLP Co-Evaluation/Treatment: Yes Reason for Co-Treatment: Complexity of the patient's impairments (multi-system involvement);For patient/therapist safety;To address functional/ADL transfers PT goals addressed during session:  Mobility/safety with mobility;Balance;Strengthening/ROM OT goals addressed during session: ADL's and self-care  AM-PAC PT "6 Clicks" Daily Activity     Outcome Measure Help from another person eating meals?: A Lot Help from another person taking care of personal grooming?: A Lot Help from another person toileting, which includes using toliet, bedpan, or urinal?: A Lot Help from another person bathing (including washing, rinsing, drying)?: A Lot Help from another person to put on and taking off regular upper body clothing?: A Lot Help from another person to put on and taking off regular lower body clothing?: A Lot 6 Click Score: 12   End of Session Equipment Utilized During Treatment: Gait belt Nurse Communication: Mobility status  Activity Tolerance: Patient tolerated treatment well Patient left: in chair;with call bell/phone within reach;with chair alarm set  OT Visit Diagnosis: Other abnormalities of gait and mobility (R26.89);Hemiplegia and hemiparesis;Other symptoms and signs involving cognitive function Hemiplegia - Right/Left: Left Hemiplegia - dominant/non-dominant: Non-Dominant Hemiplegia - caused by: Cerebral infarction                Time: 0801-0825 OT Time Calculation (min): 24 min Charges:  OT General Charges $OT Visit: 1 Procedure OT Evaluation $OT Eval Moderate Complexity: 1 Procedure OT Treatments $Self Care/Home Management : 8-22 mins G-Codes:     Norman Herrlich, MS OTR/L  Pager: Uehling A Etheridge Geil 02/18/2017, 12:48 PM

## 2017-02-18 NOTE — Progress Notes (Signed)
Referring Physician(s): Dr Royal Hawthorn  Supervising Physician: Corrie Mckusick  Patient Status:  [facility] - In-pt  Chief Complaint:  CVA   Subjective:  7/6: Procedure:  US guided right CFA access. Right cervicocerebral angiogram.  Mechanical thrombectomy of right M1 occlusion Closure of RCFA access with Exoseal  Findings: Tortuous cervical vasculature Mild athero of right bifurcation.  No significant stenosis Tortuous ICA.  Occlusion of proximal M1.  After thrombectomy, TICI 3 flow MCA territory.    Pt is following all commands Speech is slow but clear Moves all 4s  Allergies: Atorvastatin  Medications: Prior to Admission medications   Medication Sig Start Date End Date Taking? Authorizing Provider  celecoxib (CELEBREX) 200 MG capsule Take 1 capsule (200 mg total) by mouth daily. 01/02/17   Renato Shin, MD  cyanocobalamin (,VITAMIN B-12,) 1000 MCG/ML injection Inject 1,000 mcg into the muscle every 30 (thirty) days. Reported on 10/31/2015    [provider]  triamcinolone (KENALOG) 0.025 % cream APPLY TOPICALLY AS NEEDED. 06/29/15   Renato Shin, MD     Vital Signs: BP (!) 152/88   Pulse 74   Temp 98.5 F (36.9 C) (Axillary)   Resp 15   Ht 5\' 7"  (1.702 m)   Wt 162 lb 7.7 oz (73.7 kg)   SpO2 100%   BMI 25.45 kg/m   Physical Exam  HENT:  Face symmetrical Tongue midline   Neck: Neck supple.  Musculoskeletal: Normal range of motion.  Neurological: She is alert.  Skin: Skin is warm.  Right groin soft and dry No bleeding or hematoma Rt foot 1+ pulses  Nursing note and vitals reviewed.   Imaging: Ct Angio Head W Or Wo Contrast  Result Date: 02/15/2017 CLINICAL DATA:  Code stroke. EXAM: CT ANGIOGRAPHY HEAD AND NECK TECHNIQUE: Multidetector CT imaging of the head and neck was performed using the standard protocol during bolus administration of intravenous contrast. Multiplanar CT image reconstructions and MIPs were obtained to evaluate the  vascular anatomy. Carotid stenosis measurements (when applicable) are obtained utilizing NASCET criteria, using the distal internal carotid diameter as the denominator. CONTRAST:  Does not available, reference EMR COMPARISON:  Noncontrast head CT from earlier today. FINDINGS: CTA NECK FINDINGS Aortic arch: Atherosclerotic plaque.  Three vessel branching. Right carotid system: Atherosclerotic plaque at the common carotid bifurcation and proximal ICA. Mild widening of the distal right ICA. No flow limiting stenosis or ulceration. No acute dissection suspected. Left carotid system: Atherosclerosis mainly about the common carotid bifurcation and before the skullbase. The proximal ICA appears diffusely narrow and mildly irregular. Accounting for shelf-like plaque in the posterior bulb, no ulceration is suspected. Vertebral arteries: No proximal subclavian or brachiocephalic stenosis. There is plaque at the left vertebral origin with mild narrowing. Both vertebral arteries are patent to the dura. Mild undulation in widening at the V3 segments, no acute convincing dissection. Skeleton: No acute or aggressive finding. Multilevel cervical facet arthropathy. Other neck: Solid 26 mm nodule in the left thyroid. No invasive features or adenopathy. Upper chest: Unusual appearing pulmonary vein in the left upper lobe, only partly seen. Neighboring pulmonary arteries are symmetrically dense to the right, no suspected fistula. Review of the MIP images confirms the above findings CTA HEAD FINDINGS Anterior circulation: Atherosclerotic plaque on the carotid siphons. There is a right M1 cut off with poor early collaterals. Hypoplastic left A1 segment. Severe multifocal narrowing of left M2 branches, atheromatous appearing. No related M1 narrowing as it passes superior to the presumed meningioma. Posterior  circulation: Symmetric vertebral arteries. Plaque on the vertebral arteries without flow limiting stenosis. Atheromatous  irregularity and narrowing of the left more than right PCAs, advanced. No major branch occlusion or aneurysm. Venous sinuses: Limited venous opacification. Anatomic variants: None significant. Delayed phase: Not performed in the emergent setting. These results were called by telephone at the time of interpretation on 02/15/2017 at 8:44 pm to Dr. Dorian Pod , who verbally acknowledged these results. Review of the MIP images confirms the above findings IMPRESSION: 1. Emergent large vessel occlusion with right M1 clot. 2. Relatively mild atherosclerosis in the neck without flow limiting stenosis. Bilateral V3 segments and ICAs are somewhat irregular, question FMD. 3. Advanced intracranial atherosclerosis with high-grade left M2 and bilateral P2 segment stenoses. 4. Meningiomas as described on prior noncontrast study. 5. 26 mm left thyroid mass. Electronically Signed   By: Monte Fantasia M.D.   On: 02/15/2017 20:58   Ct Head Wo Contrast  Result Date: 02/15/2017 CLINICAL DATA:  Follow-up examination status post catheter directed intervention for acute right MCA territory stroke. EXAM: CT HEAD WITHOUT CONTRAST TECHNIQUE: Contiguous axial images were obtained from the base of the skull through the vertex without intravenous contrast. COMPARISON:  Prior CTA from earlier same day. FINDINGS: Brain: Stable atrophy with chronic microvascular ischemic disease. No acute intracranial hemorrhage. Possible subtle early changes of acute ischemia noted at the right lentiform nucleus. No other convincing evidence for acute evolving large vessel territory infarct. Intracranial meningiomas again noted. No other mass lesion. No midline shift. No hydrocephalus. No extra-axial fluid collection. Vascular: Contrast material seen throughout the intracranial circulation. Intracranial atherosclerosis again noted. Skull: Scalp soft tissues and calvarium within normal limits. Sinuses/Orbits: Globes and orbital soft tissues normal. Paranasal  sinuses and mastoid air cells are clear. IMPRESSION: 1. Probable subtle early changes of evolving acute ischemia involving the right lentiform nucleus. 2. No other acute intracranial process identified. No complication status post catheter directed revascularization. Electronically Signed   By: Jeannine Boga M.D.   On: 02/15/2017 23:50   Ct Angio Neck W Or Wo Contrast  Result Date: 02/15/2017 CLINICAL DATA:  Code stroke. EXAM: CT ANGIOGRAPHY HEAD AND NECK TECHNIQUE: Multidetector CT imaging of the head and neck was performed using the standard protocol during bolus administration of intravenous contrast. Multiplanar CT image reconstructions and MIPs were obtained to evaluate the vascular anatomy. Carotid stenosis measurements (when applicable) are obtained utilizing NASCET criteria, using the distal internal carotid diameter as the denominator. CONTRAST:  Does not available, reference EMR COMPARISON:  Noncontrast head CT from earlier today. FINDINGS: CTA NECK FINDINGS Aortic arch: Atherosclerotic plaque.  Three vessel branching. Right carotid system: Atherosclerotic plaque at the common carotid bifurcation and proximal ICA. Mild widening of the distal right ICA. No flow limiting stenosis or ulceration. No acute dissection suspected. Left carotid system: Atherosclerosis mainly about the common carotid bifurcation and before the skullbase. The proximal ICA appears diffusely narrow and mildly irregular. Accounting for shelf-like plaque in the posterior bulb, no ulceration is suspected. Vertebral arteries: No proximal subclavian or brachiocephalic stenosis. There is plaque at the left vertebral origin with mild narrowing. Both vertebral arteries are patent to the dura. Mild undulation in widening at the V3 segments, no acute convincing dissection. Skeleton: No acute or aggressive finding. Multilevel cervical facet arthropathy. Other neck: Solid 26 mm nodule in the left thyroid. No invasive features or  adenopathy. Upper chest: Unusual appearing pulmonary vein in the left upper lobe, only partly seen. Neighboring pulmonary arteries are symmetrically  dense to the right, no suspected fistula. Review of the MIP images confirms the above findings CTA HEAD FINDINGS Anterior circulation: Atherosclerotic plaque on the carotid siphons. There is a right M1 cut off with poor early collaterals. Hypoplastic left A1 segment. Severe multifocal narrowing of left M2 branches, atheromatous appearing. No related M1 narrowing as it passes superior to the presumed meningioma. Posterior circulation: Symmetric vertebral arteries. Plaque on the vertebral arteries without flow limiting stenosis. Atheromatous irregularity and narrowing of the left more than right PCAs, advanced. No major branch occlusion or aneurysm. Venous sinuses: Limited venous opacification. Anatomic variants: None significant. Delayed phase: Not performed in the emergent setting. These results were called by telephone at the time of interpretation on 02/15/2017 at 8:44 pm to Dr. Dorian Pod , who verbally acknowledged these results. Review of the MIP images confirms the above findings IMPRESSION: 1. Emergent large vessel occlusion with right M1 clot. 2. Relatively mild atherosclerosis in the neck without flow limiting stenosis. Bilateral V3 segments and ICAs are somewhat irregular, question FMD. 3. Advanced intracranial atherosclerosis with high-grade left M2 and bilateral P2 segment stenoses. 4. Meningiomas as described on prior noncontrast study. 5. 26 mm left thyroid mass. Electronically Signed   By: Monte Fantasia M.D.   On: 02/15/2017 20:58   Mr Brain Wo Contrast  Result Date: 02/16/2017 CLINICAL DATA:  Followup stroke, status post mechanical thrombectomy of RIGHT M1 occlusion February 15, 2017. EXAM: MRI HEAD WITHOUT CONTRAST TECHNIQUE: Multiplanar, multiecho pulse sequences of the brain and surrounding structures were obtained without intravenous contrast.  COMPARISON:  CT HEAD February 15, 2017 and CTA HEAD February 15, 2017. FINDINGS: Mild to moderately motion degraded examination. BRAIN: Numerous areas of reduced diffusion within RIGHT temporal lobe, RIGHT occipital lobe, RIGHT frontal lobe, largest within RIGHT basal ganglia measuring 13 x 7 mm. Areas demonstrate normalized to mildly decreased ADC values. No susceptibility artifact to suggest hemorrhagic conversion though SWAN sequences mildly motion degraded. Ventricles and sulci are normal for patient's age. Old LEFT basal ganglia lacunar infarct. Patchy to confluent supratentorial white matter T2 hyperintensities. Old small RIGHT cerebellar infarcts. No abnormal extra-axial fluid collections. Low signal 2.8 x 2.1 cm mass contiguous with the LEFT anterior clinoid. VASCULAR: Normal major intracranial vascular flow voids present at skull base. SKULL AND UPPER CERVICAL SPINE: No abnormal sellar expansion. No suspicious calvarial bone marrow signal. Craniocervical junction maintained. SINUSES/ORBITS: The mastoid air-cells and included paranasal sinuses are well-aerated. The included ocular globes and orbital contents are non-suspicious. OTHER: None. IMPRESSION: 1. Motion degraded examination. Acute to possibly subacute multifocal small RIGHT MCA territory infarcts without hemorrhagic conversion. 2. Moderate chronic small vessel ischemic disease. Old LEFT basal ganglia and RIGHT cerebellar small infarcts. 3. 2.8 x 2.1 cm LEFT paraclinoid meningioma. Electronically Signed   By: Elon Alas M.D.   On: 02/16/2017 17:06   Ir US Guide Vasc Access Right  Result Date: 02/16/2017 INDICATION: 83 year old female with a history of acute right MCA syndrome with confirmed right M1 occlusion, referred for mechanical thrombectomy. EXAM: ULTRASOUND-GUIDED RIGHT COMMON FEMORAL ARTERY ACCESS RIGHT CERVICAL AND CEREBRAL ANGIOGRAM MECHANICAL THROMBECTOMY OF RIGHT MCA OCCLUSION SECONDARY TO THROMBOEMBOLISM COMPARISON:  CT and CT angiogram  02/15/2017 MEDICATIONS: 2.0 g Ancef. The antibiotic was administered within 1 hour of the procedure ANESTHESIA/SEDATION: General endotracheal tube anesthesia with the anesthesia team CONTRAST:  75 cc Isovue 300 FLUOROSCOPY TIME:  Fluoroscopy Time: 17 minutes 36 seconds (1,001 mGy). COMPLICATIONS: None TECHNIQUE: Informed written consent was obtained from the patient after a thorough  discussion of the procedural risks, benefits and alternatives. All questions were addressed. Maximal Sterile Barrier Technique was utilized including caps, mask, sterile gowns, sterile gloves, sterile drape, hand hygiene and skin antiseptic. A timeout was performed prior to the initiation of the procedure. FINDINGS: Initial angiogram: Right common carotid artery: Tortuous course with no significant stenosis or calcified disease of the common carotid artery. Right external carotid artery: Patent with antegrade flow. Right internal carotid artery: Mild irregular plaque at the right carotid bifurcation with no significant stenosis. Tortuous course of the cervical right ICA with no significant narrowing. Transient vasospasm present in the proximal cervical ICA with placement of the balloon guide catheter. Conical shaped out pouching from the carotid terminus favored to represent infundibulum. No significant irregular plaque of the intracranial ICA. Right MCA: Proximal occlusion of the right MCA, with patency maintained of a small proximal temporal branch. Irregular contrast margin at the proximal face of the occlusion. Right ACA: Patent A1 segment with large caliber. The right A1 perfuses both the right and left anterior cerebral artery, with a common a 2 segment. Incomplete characterization of a left-sided fenestration involving the left A1 segment. TICI score:  Right MCA TICI 0 Completion angiogram: Right internal carotid artery: Near complete resolution of the vasospasm at the completion of the case and withdrawal of the balloon guide  catheter to the common carotid artery. Forward flow maintained. Right MCA: After treatment, there is restoration of complete flow through the middle cerebral artery with slight irregularity at the origin of the superior and inferior division of the MCA. No luminal thrombus or filling defect. Known embolization to new territory identified. There are 2 separate small temporal lobe branches from the inferior aspect of the middle cerebral artery, patent. Lenticulostriate branches patent. Right ACA: A 1 segment patent. Continued filling of the bilateral anterior cerebral artery from the right-sided circulation TICI score:  Right MCA TICI 3 PROCEDURE: Informed written consent was obtained from the patient, with signing of the consent as essentially emergent consent given the absence of family, after a thorough discussion of the procedural risks, benefits and alternatives. Specific risks addressed include: Bleeding, infection, contrast reaction, kidney injury/failure, need for further procedure/surgery, arterial injury or dissection, embolization to new territory, intracranial hemorrhage (10-15% risk), neurologic deterioration, cardiopulmonary collapse, death. All questions were addressed. Maximal Sterile Barrier Technique was utilized including during the procedure including caps, mask, sterile gowns, sterile gloves, sterile drape, hand hygiene and skin antiseptic. A timeout was performed prior to the initiation of the procedure. Ultrasound survey of the right inguinal region was performed with images stored and sent to PACs. A micropuncture needle was used access the right common femoral artery under ultrasound. With excellent arterial blood flow returned, an .018 micro wire was passed through the needle, observed to enter the abdominal aorta under fluoroscopy. The needle was removed, and a micropuncture sheath was placed over the wire. The inner dilator and wire were removed, and an 035 Bentson wire was advanced under  fluoroscopy into the abdominal aorta. The sheath was removed and a standard 5 [place] vascular sheath was placed. The dilator was removed and the sheath was flushed. A 75F JB-1 diagnostic catheter was advanced over the wire to the proximal descending thoracic aorta. Wire was then removed. Double flush of the catheter was performed. Catheter was then used to select the innominate artery. Angiogram was performed. Using roadmap technique, the catheter was advanced over a roadrunner wire into right common carotid artery. Formal angiogram was performed. The JB  1 catheter was then navigated over the roadrunner wire into the cervical ICA. Wire was removed. Exchange length Rosen wire was then passed through the diagnostic catheter to the cervical carotid artery and the diagnostic catheter was removed. The 5 French sheath was removed and exchanged for 8 French 55 centimeter BrightTip sheath. Sheath was flushed and attached to pressurized and heparinized saline bag for constant forward flow. Then an 8 [place], 85 cm Flowgate balloon tip catheter was prepared on the back table with inflation of the balloon with 50/50 concentration of dilute contrast. The balloon catheter was then advanced over the wire, positioned into the cervical ICA. Copious back flush was performed and the balloon catheter was attached to heparinized and pressurized saline bag for forward flow. Rotating hemostatic valve was attached to the back end of the balloon guide catheter. Angiogram of the intracranial ICA was performed for road map guide. A try axial intermediate catheter and microcatheter system was assembled on the back table including a 132 cm CAT 5 (Stryker) catheter with RHV, and a 150 cm coaxial Trevo Provue18 microcatheter. A synchro soft 014 wire was back-loaded with tip shape performed before insertion. Catheter system was then introduced through the balloon guide catheter, leading with the micro wire. Microcatheter system was advanced into  the internal carotid artery, to the level of the occlusion. The micro wire was then carefully advanced through the occluded segment, with the cat 5 catheter supporting in the supraclinoid ICA. Microcatheter was then push through the occluded segment and the wire was removed. Blood was then aspirated through the hub of the microcatheter, and a gentle contrast injection was performed confirming intraluminal position. A rotating hemostatic valve was then attached to the back end of the microcatheter, and a pressurized and heparinized saline bag was attached to the catheter. 4 x 40 solitaire device was then selected. Back flush was achieved at the rotating hemostatic valve, and then the device was gently advanced through the microcatheter to the distal end. The retriever was then unsheathed by withdrawing the microcatheter under fluoroscopy. Manual constant aspiration was then performed at the tip of the intermediate guide catheter as the retriever was gently and slowly withdrawn with fluoroscopic observation. As the solitaire device was initially withdrawn, the CT 5 catheter was gently advanced to the ICA terminus and proximal MCA for local aspiration. Once the retriever was entirely removed from the intermediate catheter, free aspiration was confirmed at the hub of the intermediate catheter, with free blood return confirmed. Control angiogram was then performed. Cat 5 was then withdrawn to the cervical ICA and repeat angiogram performed. Cat 5 was removed. The 8 French femoral artery sheath was withdrawn to the iliac artery. Control angiogram was performed at the right common femoral artery puncture site. The 55 centimeter 8 French sheath was exchanged for a standard 8 French sheath at the common femoral artery puncture site. A 7 [place] Exoseal device was deployed for hemostasis. Manual pressure was applied for 15 minutes and a sterile pressure dressing was applied. Patient tolerated the procedure well and remained  hemodynamically stable throughout. No complications were encountered and no significant blood loss encountered. IMPRESSION: Status post right cerebral angiogram and mechanical thrombectomy of M1 occlusion contributing to acute right MCA syndrome with restoration of TICI 3 flow. Signed, Dulcy Fanny. Earleen Newport, DO [facility] Radiology PLAN: Stat head CT Patient remained intubated with right leg straight for 6 hours and compression dressing to be removed in a.m. Admit to neuro ICU  Systolic blood pressure target 120-140 Electronically Signed   By: Corrie Mckusick D.O.   On: 02/16/2017 09:42   Dg Chest Port 1 View  Result Date: 02/16/2017 CLINICAL DATA:  Acute respiratory failure with hypoxia. EXAM: PORTABLE CHEST 1 VIEW COMPARISON:  None. FINDINGS: Endotracheal tube tip between the clavicular heads and carina. Mild retrocardiac opacity, likely atelectasis in this setting. No edema, effusion, or pneumothorax. Normal heart size and mediastinal contours. IMPRESSION: 1. Endotracheal tube in good position. 2. Mild retrocardiac atelectasis. Electronically Signed   By: Monte Fantasia M.D.   On: 02/16/2017 07:18   Dg Swallowing Func-speech Pathology  Result Date: 02/17/2017 Objective Swallowing Evaluation: Type of Study: MBS-Modified Barium Swallow Study Patient Details Name: Rachell Druckenmiller MRN: [id number] Date of Birth: 1924/04/22 Today's Date: 02/17/2017 Time: SLP Start Time (ACUTE ONLY): 1158-SLP Stop Time (ACUTE ONLY): 1223 SLP Time Calculation (min) (ACUTE ONLY): 25 min Past Medical History: Past Medical History: Diagnosis Date . DIABETES MELLITUS, TYPE II 06/24/2009 . HYPERLIPIDEMIA 05/09/2007 . HYPERTENSION 05/09/2007 . Morbid obesity ([place])  . OSTEOARTHRITIS 05/09/2007 Past Surgical History: Past Surgical History: Procedure Laterality Date . IR PERCUTANEOUS ART THROMBECTOMY/INFUSION INTRACRANIAL INC DIAG ANGIO  02/15/2017 . IR US GUIDE VASC ACCESS RIGHT  02/15/2017 HPI: Germaine Ripp  an 83 y.o.femalewith a past medical history that is significant for HTN, HLD, DM, and reportedly [place] unclear if taking anticoagulation, who at baseline is quite functional and pretty independent but brought to the ED as a Code Stroke by [facility] from [facility]. Neighbor saw pt slide out of chair at 1900. Pt with sudden onset of L sidedparalysis, slurred speech, R sided gaze. Code Stroke called.  No family present at bedside and unsuccessful attempt to contact her brother or other family member to obtain further information.  She is currently awake, very dysarthric, mildly confused, with dense L Hemiparesis, L face droop, and R gaze preference.  STAT CT head showed no acute abnormality, ASPECTS 10, but evidence of a large 2. 3 cm extra-axial mass along the left anterior clinoid, partly calcified and most consistent with a meningioma.as well as probable additional 17 x 8 mm left posterior fossa meningioma.  CTA head revealed R M1 occlusion withpoor early collaterals.   Patient is s/p thrombectomy.  MRI is showing acute to subacute right MCA territory infarcts and old left basal ganglia and right cerebellar infarcts.   Subjective: The patient was seen in radiology to determine current swallowing physiology and least restrictive diet.  Assessment / Plan / Recommendation CHL IP CLINICAL IMPRESSIONS 02/17/2017 Clinical Impression MBS was completed using thin liquids, nectar thick liquids and pureed material.  The patient presented with oropharyngeal dysphagia.  The oral phase was characterized by oral holding across all textures with premature loss of the bolus which was seen primarily given pureed material.  The oral holding was significant given pureed material.  Oral residue was noted across textures.  The pharyngeal phase of the swallow was characterized by a swallow trigger in the valleculae given pureed material and a swallow trigger in the pyriform sinuses given thin and nectar thick liquids.  Silent  penetration into the laryngeal vestibule was noted given tsp sips of thin liquids prior to the swallow.  Silent penetration was seen into the laryngeal vestibule during the swallow given very large cup sips of nectar thick liquids.  Given small patient controlled straw sips it was not seen.  Use of a chin tuck was attempted given tsp sips of thin liquids and was not sucessful to  help prevent the penetration.  Esophageal sweep revealed it slow to clear.  Recommend a dysphagia 1 diet with nectar thick liquids.  The patient should take one small sip at a time.  Given performance with pureed material the patient's oral cavity should be checked between bites/sips to ensure oral cavity is clear prior to next bolus presentation.  She may require cues to swallow.  The patient will require ST follow during acute stay and at next level of care.     SLP Visit Diagnosis Dysphagia, oropharyngeal phase (R13.12) Attention and concentration deficit following -- Frontal lobe and executive function deficit following -- Impact on safety and function Mild aspiration risk   [facility] IP TREATMENT RECOMMENDATION 02/17/2017 Treatment Recommendations Therapy as outlined in treatment plan below   Prognosis 02/17/2017 Prognosis for Safe Diet Advancement Fair Barriers to Reach Goals -- Barriers/Prognosis Comment -- [facility] IP DIET RECOMMENDATION 02/17/2017 SLP Diet Recommendations Dysphagia 1 (Puree) solids;Nectar thick liquid Liquid Administration via Cup Medication Administration Crushed with puree Compensations Slow rate;Small sips/bites;Follow solids with liquid Postural Changes Remain semi-upright after after feeds/meals (Comment);Seated upright at 90 degrees   [facility] IP OTHER RECOMMENDATIONS 02/17/2017 Recommended Consults -- Oral Care Recommendations Oral care BID;Oral care before and after PO Other Recommendations Order thickener from pharmacy;Prohibited food (jello, ice cream, thin soups);Have oral suction available   [facility] IP FOLLOW UP RECOMMENDATIONS  02/17/2017 Follow up Recommendations Other (comment)   [facility] IP FREQUENCY AND DURATION 02/17/2017 Speech Therapy Frequency (ACUTE ONLY) min 2x/week Treatment Duration 2 weeks      [facility] IP ORAL PHASE 02/17/2017 Oral Phase Impaired Oral - Pudding Teaspoon -- Oral - Pudding Cup -- Oral - Honey Teaspoon -- Oral - Honey Cup -- Oral - Nectar Teaspoon Holding of bolus;Delayed oral transit Oral - Nectar Cup Delayed oral transit;Holding of bolus Oral - Nectar Straw Delayed oral transit;Holding of bolus Oral - Thin Teaspoon Delayed oral transit;Holding of bolus Oral - Thin Cup -- Oral - Thin Straw -- Oral - Puree Delayed oral transit;Holding of bolus;Premature spillage Oral - Mech Soft -- Oral - Regular -- Oral - Multi-Consistency -- Oral - Pill -- Oral Phase - Comment --  [facility] IP PHARYNGEAL PHASE 02/17/2017 Pharyngeal Phase Impaired Pharyngeal- Pudding Teaspoon -- Pharyngeal -- Pharyngeal- Pudding Cup -- Pharyngeal -- Pharyngeal- Honey Teaspoon -- Pharyngeal -- Pharyngeal- Honey Cup -- Pharyngeal -- Pharyngeal- Nectar Teaspoon Delayed swallow initiation-pyriform sinuses;Delayed swallow initiation-vallecula;Penetration/Aspiration during swallow Pharyngeal Material enters airway, remains ABOVE vocal cords and not ejected out Pharyngeal- Nectar Cup Delayed swallow initiation-pyriform sinuses;Penetration/Aspiration during swallow Pharyngeal Material enters airway, remains ABOVE vocal cords and not ejected out Pharyngeal- Nectar Straw Delayed swallow initiation-pyriform sinuses Pharyngeal -- Pharyngeal- Thin Teaspoon Delayed swallow initiation-pyriform sinuses;Penetration/Aspiration before swallow Pharyngeal Material enters airway, remains ABOVE vocal cords and not ejected out Pharyngeal- Thin Cup -- Pharyngeal -- Pharyngeal- Thin Straw -- Pharyngeal -- Pharyngeal- Puree Delayed swallow initiation-vallecula Pharyngeal -- Pharyngeal- Mechanical Soft -- Pharyngeal -- Pharyngeal- Regular -- Pharyngeal -- Pharyngeal- Multi-consistency --  Pharyngeal -- Pharyngeal- Pill -- Pharyngeal -- Pharyngeal Comment --  [facility] IP CERVICAL ESOPHAGEAL PHASE 02/17/2017 Cervical Esophageal Phase WFL Pudding Teaspoon -- Pudding Cup -- Honey Teaspoon -- Honey Cup -- Nectar Teaspoon -- Nectar Cup -- Nectar Straw -- Thin Teaspoon -- Thin Cup -- Thin Straw -- Puree -- Mechanical Soft -- Regular -- Multi-consistency -- Pill -- Cervical Esophageal Comment -- No flowsheet data found. 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 EXAM: ULTRASOUND-GUIDED RIGHT COMMON FEMORAL ARTERY ACCESS RIGHT CERVICAL AND CEREBRAL ANGIOGRAM MECHANICAL THROMBECTOMY OF RIGHT MCA OCCLUSION SECONDARY TO THROMBOEMBOLISM COMPARISON:  CT and CT angiogram 02/15/2017 MEDICATIONS: 2.0 g Ancef. The antibiotic was administered within 1 hour of the procedure ANESTHESIA/SEDATION: General endotracheal tube anesthesia with the anesthesia team CONTRAST:  75 cc Isovue 300 FLUOROSCOPY TIME:  Fluoroscopy Time: 17 minutes 36 seconds (1,001 mGy). COMPLICATIONS: None TECHNIQUE: Informed written consent was obtained from the patient after a thorough discussion of the procedural risks, benefits and alternatives. All questions were addressed. Maximal Sterile Barrier Technique was utilized including caps, mask, sterile gowns, sterile gloves, sterile drape, hand hygiene and skin antiseptic. A timeout was performed prior to the initiation of the procedure. FINDINGS: Initial angiogram: Right common carotid artery: Tortuous course with no significant stenosis or calcified disease of the common carotid artery. Right external carotid artery: Patent with antegrade flow. Right internal carotid artery: Mild irregular plaque at the  right carotid bifurcation with no significant stenosis. Tortuous course of the cervical right ICA with no significant narrowing. Transient vasospasm present in the proximal cervical ICA with placement of the balloon guide catheter. Conical shaped out pouching from the carotid terminus favored to represent infundibulum. No significant irregular plaque of the intracranial ICA. Right MCA: Proximal occlusion of the right MCA, with patency maintained of a small proximal temporal branch. Irregular contrast margin at the proximal face of the occlusion. Right ACA: Patent A1 segment with large caliber. The right A1 perfuses both the right and left anterior cerebral artery, with a common a 2 segment. Incomplete characterization of a left-sided fenestration involving the left A1 segment. TICI score:  Right MCA TICI 0 Completion angiogram: Right internal carotid artery: Near complete resolution of the vasospasm at the completion of the case and withdrawal of the balloon guide catheter to the common carotid artery. Forward flow maintained. Right MCA: After treatment, there is restoration of complete flow through the middle cerebral artery with slight irregularity at the origin of the superior and inferior division of the MCA. No luminal thrombus or filling defect. Known embolization to new territory identified. There are 2 separate small temporal lobe branches from the inferior aspect of the middle cerebral artery, patent. Lenticulostriate branches patent. Right ACA: A 1 segment patent. Continued filling of the bilateral anterior cerebral artery from the right-sided circulation TICI score:  Right MCA TICI 3 PROCEDURE: Informed written consent was obtained from the patient, with signing of the consent as essentially emergent consent given the absence of family, after a thorough discussion of the procedural risks, benefits and alternatives. Specific risks addressed include: Bleeding, infection, contrast reaction, kidney  injury/failure, need for further procedure/surgery, arterial injury or dissection, embolization to new territory, intracranial hemorrhage (10-15% risk), neurologic deterioration, cardiopulmonary collapse, death. All questions were addressed. Maximal Sterile Barrier Technique was utilized including during the procedure including caps, mask, sterile gowns, sterile gloves, sterile drape, hand hygiene and skin antiseptic. A timeout was performed prior to the initiation of the procedure. Ultrasound survey of the right inguinal region was performed with images stored and sent to PACs. A micropuncture needle was used access the right common femoral artery under ultrasound. With excellent arterial blood flow returned, an .018 micro wire was passed through the needle, observed to enter the abdominal aorta under fluoroscopy. The needle  was removed, and a micropuncture sheath was placed over the wire. The inner dilator and wire were removed, and an 035 Bentson wire was advanced under fluoroscopy into the abdominal aorta. The sheath was removed and a standard 5 [place] vascular sheath was placed. The dilator was removed and the sheath was flushed. A 66F JB-1 diagnostic catheter was advanced over the wire to the proximal descending thoracic aorta. Wire was then removed. Double flush of the catheter was performed. Catheter was then used to select the innominate artery. Angiogram was performed. Using roadmap technique, the catheter was advanced over a roadrunner wire into right common carotid artery. Formal angiogram was performed. The JB 1 catheter was then navigated over the roadrunner wire into the cervical ICA. Wire was removed. Exchange length Rosen wire was then passed through the diagnostic catheter to the cervical carotid artery and the diagnostic catheter was removed. The 5 French sheath was removed and exchanged for 8 French 55 centimeter BrightTip sheath. Sheath was flushed and attached to pressurized and heparinized  saline bag for constant forward flow. Then an 8 [place], 85 cm Flowgate balloon tip catheter was prepared on the back table with inflation of the balloon with 50/50 concentration of dilute contrast. The balloon catheter was then advanced over the wire, positioned into the cervical ICA. Copious back flush was performed and the balloon catheter was attached to heparinized and pressurized saline bag for forward flow. Rotating hemostatic valve was attached to the back end of the balloon guide catheter. Angiogram of the intracranial ICA was performed for road map guide. A try axial intermediate catheter and microcatheter system was assembled on the back table including a 132 cm CAT 5 (Stryker) catheter with RHV, and a 150 cm coaxial Trevo Provue18 microcatheter. A synchro soft 014 wire was back-loaded with tip shape performed before insertion. Catheter system was then introduced through the balloon guide catheter, leading with the micro wire. Microcatheter system was advanced into the internal carotid artery, to the level of the occlusion. The micro wire was then carefully advanced through the occluded segment, with the cat 5 catheter supporting in the supraclinoid ICA. Microcatheter was then push through the occluded segment and the wire was removed. Blood was then aspirated through the hub of the microcatheter, and a gentle contrast injection was performed confirming intraluminal position. A rotating hemostatic valve was then attached to the back end of the microcatheter, and a pressurized and heparinized saline bag was attached to the catheter. 4 x 40 solitaire device was then selected. Back flush was achieved at the rotating hemostatic valve, and then the device was gently advanced through the microcatheter to the distal end. The retriever was then unsheathed by withdrawing the microcatheter under fluoroscopy. Manual constant aspiration was then performed at the tip of the intermediate guide catheter as the retriever  was gently and slowly withdrawn with fluoroscopic observation. As the solitaire device was initially withdrawn, the CT 5 catheter was gently advanced to the ICA terminus and proximal MCA for local aspiration. Once the retriever was entirely removed from the intermediate catheter, free aspiration was confirmed at the hub of the intermediate catheter, with free blood return confirmed. Control angiogram was then performed. Cat 5 was then withdrawn to the cervical ICA and repeat angiogram performed. Cat 5 was removed. The 8 French femoral artery sheath was withdrawn to the iliac artery. Control angiogram was performed at the right common femoral artery puncture site. The 55 centimeter 8 French sheath was exchanged for a standard 8  French sheath at the common femoral artery puncture site. A 7 [place] Exoseal device was deployed for hemostasis. Manual pressure was applied for 15 minutes and a sterile pressure dressing was applied. Patient tolerated the procedure well and remained hemodynamically stable throughout. No complications were encountered and no significant blood loss encountered. IMPRESSION: Status post right cerebral angiogram and mechanical thrombectomy of M1 occlusion contributing to acute right MCA syndrome with restoration of TICI 3 flow. Signed, Dulcy Fanny. Earleen Newport, DO [facility] PLAN: Stat head CT Patient remained intubated with right leg straight for 6 hours and compression dressing to be removed in a.m. Admit to neuro ICU Systolic blood pressure target 120-140 Electronically Signed   By: Corrie Mckusick D.O.   On: 02/16/2017 09:42   Ct Head Code Stroke W/o Cm  Result Date: 02/15/2017 CLINICAL DATA:  Code stroke.  Left-sided weakness.  Slurred speech. EXAM: CT HEAD WITHOUT CONTRAST TECHNIQUE: Contiguous axial images were obtained from the base of the skull through the vertex without intravenous contrast. COMPARISON:  None. FINDINGS: Brain: No acute  hemorrhage or visible infarct. Extra-axial mass in the left middle cranial fossa measuring 3 cm, with patchy internal mineralization. This is contiguous with the anterior clinoid process and most consistent with a meningioma. Based on the calcification the mass is solid and should not represent an aneurysm. There is likely a sessile dural-based mass in the upper left posterior fossa measuring 8 x 17 mm. No parenchymal edema neighboring the masses. Vascular: No hyperdense vessel.  Atherosclerotic calcification. Skull: No acute or aggressive finding.  Hyperostosis interna. Sinuses/Orbits: Gaze to the right Other: These results were called by telephone at the time of interpretation on 02/15/2017 at 8:30 pm to Dr. Aram Beecham, who verbally acknowledged these results. ASPECTS [facility] Stroke Program Early CT Score) - Ganglionic level infarction (caudate, lentiform nuclei, internal capsule, insula, M1-M3 cortex): 7 - Supraganglionic infarction (M4-M6 cortex): 3 Total score (0-10 with 10 being normal): 10 IMPRESSION: 1. No acute finding. ASPECTS is 10. 2. 3 cm extra-axial mass along the left anterior clinoid, partly calcified and most consistent with a meningioma. 3. Probable additional 17 x 8 mm left posterior fossa meningioma. Electronically Signed   By: Monte Fantasia M.D.   On: 02/15/2017 20:37    Labs:  CBC:  Recent Labs  02/15/17 2350 02/16/17 0015 02/17/17 0157 02/18/17 0359  WBC 7.5 7.8 9.4 8.7  HGB 12.0 11.8* 13.5 14.3  HCT 36.8 36.1 41.6 45.0  PLT 190 191 237 234    COAGS:  Recent Labs  02/15/17 2016  INR 1.00  APTT 28    BMP:  Recent Labs  02/15/17 2016 02/15/17 2020 02/16/17 0427 02/17/17 0157 02/18/17 0359  NA 139 143 140 140 139  K 4.1 4.7 3.8 3.3* 3.8  CL 110 108 112* 107 107  CO2 21*  --  22 24 25   GLUCOSE 137* 139* 135* 127* 120*  BUN 11 15 8  <5* 5*  CALCIUM 8.8*  --  8.1* 8.5* 8.7*  CREATININE 0.88 0.80 0.79 0.68 0.77  GFRNONAA 56*  --  >60 >60 >60  GFRAA >60  --   >60 >60 >60    LIVER FUNCTION TESTS:  Recent Labs  02/15/17 2016 02/16/17 0427 02/17/17 0157 02/18/17 0359  BILITOT 0.5  --   --   --   AST 20  --   --   --   ALT 12*  --   --   --   Arabella Merles  101  --   --   --   PROT 6.9  --   --   --   ALBUMIN 3.6 2.9* 3.4* 3.1*    Assessment and Plan:  CVA R MCA revascularization 76 in IR Progressing Will follow   Electronically Signed: Shifra Swartzentruber A, PA-C 02/18/2017, 2:47 PM   I spent a total of 15 Minutes at the the patient's bedside AND on the patient's hospital floor or unit, greater than 50% of which was counseling/coordinating care for CVA

## 2017-02-18 NOTE — Progress Notes (Signed)
STROKE TEAM PROGRESS NOTE   HISTORY OF PRESENT ILLNESS (per record) Lonisha Bobby is an 83 y.o. female with a past medical history that is significant for HTN, HLD, DM, and reportedly Afib but unclear if taking anticoagulation, who at baseline is quite functional and pretty independent but  brought to the ED as a Code Stroke by [facility] from [facility]. Neighbor saw pt slide out of chair at 1900. Pt with sudden onset of L sided paralysis, slurred speech, R sided gaze. Code Stroke called. No family present at bedside and unsuccessful attempt to contact her brother or other family member to obtain further information. She is currently awake, very dysarthric, mildly confused, with dense L Hemiparesis, L face droop, and R gaze preference. STAT CT head showed no acute abnormality, ASPECTS 10, but evidence of a large 2. 3 cm extra-axial mass along the left anterior clinoid, partly calcified and most consistent with a meningioma.as well as probable additional 17 x 8 mm left posterior fossa meningioma . CTA head revealed R M1 occlusion with poor early collaterals.   Date last known well: 02/15/17 Time last known well: 7 pm tPA Given: no, question of a fib on the chart and unknown if patient taking anticoagulation. NIHSS: 24 MRS: 0   SUBJECTIVE (INTERVAL HISTORY) No family members present. The patient continues to do well and has passed swallow eval. .She is sitting up in a bedside chair and eating a soft diet  OBJECTIVE Temp:  [98.1 F (36.7 C)-98.6 F (37 C)] 98.5 F (36.9 C) (07/09 0747) Pulse Rate:  [51-92] 86 (07/09 1000) Cardiac Rhythm: Normal sinus rhythm (07/08 2000) Resp:  [11-24] 18 (07/09 1000) BP: (139-184)/(74-111) 139/93 (07/09 1000) SpO2:  [90 %-100 %] 90 % (07/09 1000)  CBC:   Recent Labs Lab 02/17/17 0157 02/18/17 0359  WBC 9.4 8.7  NEUTROABS 4.8 4.7  HGB 13.5 14.3  HCT 41.6 45.0  MCV 94.8 96.4  PLT 237 132    Basic Metabolic Panel:   Recent Labs Lab  02/17/17 0157 02/18/17 0359  NA 140 139  K 3.3* 3.8  CL 107 107  CO2 24 25  GLUCOSE 127* 120*  BUN <5* 5*  CREATININE 0.68 0.77  CALCIUM 8.5* 8.7*  MG 2.0 1.9  PHOS 2.9 3.6    Lipid Panel:     Component Value Date/Time   CHOL 164 02/16/2017 0015   TRIG 127 02/16/2017 0015   HDL 29 (L) 02/16/2017 0015   CHOLHDL 5.7 02/16/2017 0015   VLDL 25 02/16/2017 0015   LDLCALC 110 (H) 02/16/2017 0015   HgbA1c:  Lab Results  Component Value Date   HGBA1C 7.2 (H) 02/16/2017   Urine Drug Screen: No results found for: LABOPIA, COCAINSCRNUR, LABBENZ, AMPHETMU, THCU, LABBARB  Alcohol Level No results found for: ETH  IMAGING  Ct Angio Head W Or Wo Contrast Ct Angio Neck W Or Wo Contrast 02/15/2017 1. Emergent large vessel occlusion with right M1 clot.  2. Relatively mild atherosclerosis in the neck without flow limiting stenosis. Bilateral V3 segments and ICAs are somewhat irregular, question FMD.  3. Advanced intracranial atherosclerosis with high-grade left M2 and bilateral P2 segment stenoses. 4. Meningiomas as described on prior noncontrast study.  5. 26 mm left thyroid mass.    Ct Head Wo Contrast 02/15/2017 1. Probable subtle early changes of evolving acute ischemia involving the right lentiform nucleus.  2. No other acute intracranial process identified. No complication status post catheter directed revascularization.    Dg Chest [facility]  1 View 02/16/2017 1. Endotracheal tube in good position.  2. Mild retrocardiac atelectasis.    Ct Head Code Stroke W/o Cm 02/15/2017 1. No acute finding. ASPECTS is 10.  2. 3 cm extra-axial mass along the left anterior clinoid, partly calcified and most consistent with a meningioma.  3. Probable additional 17 x 8 mm left posterior fossa meningioma.    Interventional Radiology - Dr Earleen Newport 02/16/2017 Status post right cerebral angiogram and mechanical thrombectomy of M1 occlusion contributing to acute right MCA syndrome with restoration of  TICI 3 flow.   PHYSICAL EXAM Elderly african [place] lady who is not in distress. . Afebrile. Head is nontraumatic. Neck is supple without bruit.    Cardiac exam no murmur or gallop. Lungs are clear to auscultation. Distal pulses are well felt. Neurological Exam :  Awake alert and interactive dysarthria but can be understood. No aphasia. Follows commands well.. Right gaze preference  Decreased blink to thr threat on the left compared to the right. Pupils equal reactive Follows simple midline commands and moves all 4 extremities against gravity. Mild left hemiparesis 4/5.Marland Kitchen Deep tendon reflexes are symmetric. Plantars downgoing sensation is symmetric bilaterally.     ASSESSMENT/PLAN Ms. Lounette Sloan is a 83 y.o. female with history of osteoarthritis, hypertension, hyperlipidemia, atrial fibrillation (unknown anticoagulation status) and diabetes mellitus presenting with dysarthria, confusion, left hemiparesis, left facial droop, and right gaze preference. She did not receive IV t-PA as it was unclear if she was on anticoagulation prior to admission. The patient underwent thrombectomy as noted above.  Stroke:  Right middle cerebral artery due to right M1 occlusion status post recanalization with mechanical embolectomy- etiology likely embolic - probably due to atrial fibrillation.  Resultant  Mild left hemiparesis  CT head - Probable subtle early changes of evolving acute ischemia involving the right lentiform nucleus.  MRI head - Motion degraded examination. Acute to possibly subacute multifocal small RIGHT MCA territory infarcts without hemorrhagic conversion.Moderate chronic small vessel ischemic disease. Old LEFT basal ganglia and RIGHT cerebellar small infarcts. 2.8 x 2.1 cm LEFT paraclinoid meningioma.  MRA head - not performed  CTA H&N - Right M1 occlusion with clot. High-grade left M2 and bilateral P2 segment stenoses.   Carotid Doppler - CTA neck 2D Echo -  Left ventricle: The  cavity size was normal. Wall thickness was   normal. Systolic function was vigorous. The estimated ejection   fraction was in the range of 65% to 70%. Wall motion was normal;    there were no regional wall motion abnormalities.  LDL - 110  HgbA1c - 7.2  VTE prophylaxis - subcutaneous heparin DIET - DYS 1 Room service appropriate? Yes; Fluid consistency: Nectar Thick  No antithrombotic prior to admission, now on No antithrombotic -> ASA suppository for now.  Patient will be counseled to be compliant with her antithrombotic medications  Ongoing aggressive stroke risk factor management  Therapy recommendations:  CLR   Disposition: Pending  Hypertension  Stable  Per interventional radiology -> SBP goal 120 to 140  Long-term BP goal normotensive  Hyperlipidemia  Home meds:  No lipid lowering medications prior to admission  LDL 110, goal < 70  Added Lipitor    Continue statin at discharge  Diabetes  HgbA1c 7.2, goal < 7.0  Unc / Controlled  Other Stroke Risk Factors  Advanced age  Overweight, Body mass index is 25.45 kg/m., recommend weight loss, diet and exercise as appropriate    Other Active Problems  26 mm left thyroid  mass (incidental finding)  Meningiomas (incidental finding)  High-grade left M2 and bilateral P2 segment stenoses.   Hospital day # 3  I have personally examined this patient, reviewed notes, independently viewed imaging studies, participated in medical decision making and plan of care.ROS completed by me personally and pertinent positives fully documented  I have made any additions or clarifications directly to the above note.  She presented with right M1 occlusion and underwent successful mechanical embolectomy and appears to be doing well. . Continue strict BP control  with systolic goal between [id number]. and close neurological monitoring.Continue aspirin.  Mobilize out of bed. Physical occupational and speech therapy consults. Transfer  to the floor bed later today No family available at bedside for discussion. Rehabilitation consult This patient is critically ill and at significant risk of neurological worsening, death and care requires constant monitoring of vital signs, hemodynamics,respiratory and cardiac monitoring, extensive review of multiple databases, frequent neurological assessment, discussion with family, other specialists and medical decision making of high complexity.I have made any additions or clarifications directly to the above note.This critical care time does not reflect procedure time, or teaching time or supervisory time of PA/NP/Med Resident etc but could involve care discussion time.  I spent 30 minutes of neurocritical care time  in the care of  this patient.     Antony Contras, MD Medical Director [place] Pager: [phone] 02/18/2017 12:42 PM   To contact Stroke Continuity provider, please refer to [email or web address] After hours, contact General Neurology

## 2017-02-18 NOTE — Progress Notes (Signed)
Physical Therapy Treatment Patient Details Name: Shelby Jennings MRN: [id number] DOB: 02-Jul-1924 Today's Date: 02/18/2017    History of Present Illness 83 y.o. female admitted on 02/15/17 for left sided weakness and slurred speech. CTA head revealed R M1 occlusion and pt underwent clot retrieval in IR on day of admission.  She was intubated post proceedure and extuabted 02/16/17.  Pt with significant PMH of OA, DM 2, and HTN.      PT Comments    Pt progressing towards physical therapy goals. Noted decreased pushing R this session in sitting and was able to hold upright posture without therapist support for ~10 seconds at a time. Pt was able to transition to recliner chair with +2 mod assist for weight shift and LLE advancement. RN educated regarding safe transfer back to bed, and recommended use of Stedy if pt fatigued. Will continue to follow and progress as able per POC.   Follow Up Recommendations  CIR     Equipment Recommendations  3in1 (PT);Rolling walker with 5" wheels (L platform)    Recommendations for Other Services Rehab consult     Precautions / Restrictions Precautions Precautions: Fall Precaution Comments: left side weak and inattantion.  Restrictions Weight Bearing Restrictions: No    Mobility  Bed Mobility Overal bed mobility: Needs Assistance Bed Mobility: Rolling;Sidelying to Sit Rolling: Mod assist Sidelying to sit: Mod assist;+2 for physical assistance       General bed mobility comments: Bed pad used to assist in rolling, and +2 assist provided for transition to sitting position. Increased time required for pt to gain/maintain sitting balance, and a posterior lean was noted. Noted decreased pushing to the R compared to initial eval.   Transfers Overall transfer level: Needs assistance Equipment used: 2 person hand held assist Transfers: Sit to/from [facility] Sit to Stand: Mod assist;+2 physical assistance Stand pivot transfers: Mod assist;+2  physical assistance       General transfer comment: +2 assist provided for power-up to full standing position. Increased time required for narrowing BOS, and assist provided for L foot adduction. Pt was able to take a few steps around to the chair with assist provided for weight shift and advancing feet at times (mainly LLE).   Ambulation/Gait             General Gait Details: Unable to progress gait training this session. Pt was limited to SPT to chair. Recommended use of Stedy back to bed with nursing if pt fatigued.    Stairs            Wheelchair Mobility    Modified Rankin (Stroke Patients Only) Modified Rankin (Stroke Patients Only) Pre-Morbid Rankin Score: No symptoms Modified Rankin: Severe disability     Balance Overall balance assessment: Needs assistance Sitting-balance support: Feet supported;Single extremity supported Sitting balance-Leahy Scale: Poor     Standing balance support: Bilateral upper extremity supported Standing balance-Leahy Scale: Zero Standing balance comment: +2 required.                             Cognition Arousal/Alertness: Awake/alert Behavior During Therapy: WFL for tasks assessed/performed Overall Cognitive Status: Impaired/Different from baseline Area of Impairment: Attention;Memory;Following commands;Safety/judgement;Awareness;Problem solving;Orientation                 Orientation Level: Disoriented to;Situation Current Attention Level: Sustained Memory: Decreased short-term memory Following Commands: Follows one step commands consistently Safety/Judgement: Decreased awareness of safety;Decreased awareness of deficits Awareness: Intellectual Problem  Solving: Slow processing;Difficulty sequencing;Requires verbal cues;Requires tactile cues        Exercises      General Comments        Pertinent Vitals/Pain Pain Assessment: No/denies pain    Home Living                      Prior  Function            PT Goals (current goals can now be found in the care plan section) Acute Rehab PT Goals Patient Stated Goal: to get back to her independence PT Goal Formulation: With patient Time For Goal Achievement: 03/02/17 Potential to Achieve Goals: Good Progress towards PT goals: Progressing toward goals    Frequency    Min 4X/week      PT Plan Current plan remains appropriate    Co-evaluation PT/OT/SLP Co-Evaluation/Treatment: Yes Reason for Co-Treatment: Complexity of the patient's impairments (multi-system involvement);To address functional/ADL transfers;For patient/therapist safety PT goals addressed during session: Mobility/safety with mobility;Balance;Strengthening/ROM        AM-PAC PT "6 Clicks" Daily Activity  Outcome Measure  Difficulty turning over in bed (including adjusting bedclothes, sheets and blankets)?: Total Difficulty moving from lying on back to sitting on the side of the bed? : Total Difficulty sitting down on and standing up from a chair with arms (e.g., wheelchair, bedside commode, etc,.)?: Total Help needed moving to and from a bed to chair (including a wheelchair)?: A Lot Help needed walking in hospital room?: Total Help needed climbing 3-5 steps with a railing? : Total 6 Click Score: 7    End of Session Equipment Utilized During Treatment: Gait belt Activity Tolerance: Patient tolerated treatment well Patient left: in chair;with call bell/phone within reach;with chair alarm set Nurse Communication: Mobility status PT Visit Diagnosis: Difficulty in walking, not elsewhere classified (R26.2);Hemiplegia and hemiparesis Hemiplegia - Right/Left: Left Hemiplegia - dominant/non-dominant: Non-dominant Hemiplegia - caused by: Cerebral infarction     Time: 0801-0825 PT Time Calculation (min) (ACUTE ONLY): 24 min  Charges:  $Gait Training: 8-22 mins                    G Codes:       Rolinda Roan, PT, DPT Acute Rehabilitation  Services Pager: [phone]    Marleta Comp 02/18/2017, 9:19 AM

## 2017-02-18 NOTE — Progress Notes (Signed)
I met with pt at bedside to discuss the need for a rehab stay prior to her d/c. She states she feels she can d/c home. Does not need any. I have attempted to call pt's NOK, brother, by hone with no answer. Pt states she has no children. Without any caregiver support, pt likely will need SNF rehab.  I will alert SW. [phone]

## 2017-02-19 LAB — URINALYSIS, ROUTINE W REFLEX MICROSCOPIC
BILIRUBIN URINE: NEGATIVE
Glucose, UA: NEGATIVE mg/dL
Hgb urine dipstick: NEGATIVE
Ketones, ur: NEGATIVE mg/dL
LEUKOCYTES UA: NEGATIVE
NITRITE: NEGATIVE
PH: 5 (ref 5.0–8.0)
Protein, ur: NEGATIVE mg/dL
SPECIFIC GRAVITY, URINE: 1.021 (ref 1.005–1.030)

## 2017-02-19 LAB — GLUCOSE, CAPILLARY
GLUCOSE-CAPILLARY: 116 mg/dL — AB (ref 65–99)
Glucose-Capillary: 115 mg/dL — ABNORMAL HIGH (ref 65–99)
Glucose-Capillary: 134 mg/dL — ABNORMAL HIGH (ref 65–99)
Glucose-Capillary: 145 mg/dL — ABNORMAL HIGH (ref 65–99)
Glucose-Capillary: 147 mg/dL — ABNORMAL HIGH (ref 65–99)
Glucose-Capillary: 200 mg/dL — ABNORMAL HIGH (ref 65–99)

## 2017-02-19 LAB — TRIGLYCERIDES: Triglycerides: 133 mg/dL (ref <150)

## 2017-02-19 NOTE — Progress Notes (Signed)
Referring Physician(s): Dr Royal Hawthorn  Supervising Physician: Corrie Mckusick  Patient Status:  [facility] - In-pt  Chief Complaint:  CVA  Subjective:  R MCA revascularization in IR 7/6 Doing well Up in bed Eating applesauce on her own Moving all 4s  Allergies: Atorvastatin  Medications: Prior to Admission medications   Medication Sig Start Date End Date Taking? Authorizing Provider  celecoxib (CELEBREX) 200 MG capsule Take 1 capsule (200 mg total) by mouth daily. 01/02/17   Renato Shin, MD  cyanocobalamin (,VITAMIN B-12,) 1000 MCG/ML injection Inject 1,000 mcg into the muscle every 30 (thirty) days. Reported on 10/31/2015    [provider]  triamcinolone (KENALOG) 0.025 % cream APPLY TOPICALLY AS NEEDED. 06/29/15   Renato Shin, MD     Vital Signs: BP (!) 154/79   Pulse 88   Temp 98 F (36.7 C) (Oral)   Resp 20   Ht 5\' 7"  (1.702 m)   Wt 162 lb 7.7 oz (73.7 kg)   SpO2 97%   BMI 25.45 kg/m   Physical Exam  Constitutional: She appears well-nourished.  Speech is clear  HENT:  Head: Atraumatic.  Face symmetrical; Tongue midline  Pulmonary/Chest: Effort normal.  Abdominal: Soft.  Musculoskeletal: Normal range of motion.  Moving all 4s Following all commands  Neurological: She is alert.  Skin: Skin is warm and dry.  Psychiatric: She has a normal mood and affect.  Nursing note and vitals reviewed.   Imaging: Ct Angio Head W Or Wo Contrast  Result Date: 02/15/2017 CLINICAL DATA:  Code stroke. EXAM: CT ANGIOGRAPHY HEAD AND NECK TECHNIQUE: Multidetector CT imaging of the head and neck was performed using the standard protocol during bolus administration of intravenous contrast. Multiplanar CT image reconstructions and MIPs were obtained to evaluate the vascular anatomy. Carotid stenosis measurements (when applicable) are obtained utilizing NASCET criteria, using the distal internal carotid diameter as the denominator. CONTRAST:  Does not available, reference  EMR COMPARISON:  Noncontrast head CT from earlier today. FINDINGS: CTA NECK FINDINGS Aortic arch: Atherosclerotic plaque.  Three vessel branching. Right carotid system: Atherosclerotic plaque at the common carotid bifurcation and proximal ICA. Mild widening of the distal right ICA. No flow limiting stenosis or ulceration. No acute dissection suspected. Left carotid system: Atherosclerosis mainly about the common carotid bifurcation and before the skullbase. The proximal ICA appears diffusely narrow and mildly irregular. Accounting for shelf-like plaque in the posterior bulb, no ulceration is suspected. Vertebral arteries: No proximal subclavian or brachiocephalic stenosis. There is plaque at the left vertebral origin with mild narrowing. Both vertebral arteries are patent to the dura. Mild undulation in widening at the V3 segments, no acute convincing dissection. Skeleton: No acute or aggressive finding. Multilevel cervical facet arthropathy. Other neck: Solid 26 mm nodule in the left thyroid. No invasive features or adenopathy. Upper chest: Unusual appearing pulmonary vein in the left upper lobe, only partly seen. Neighboring pulmonary arteries are symmetrically dense to the right, no suspected fistula. Review of the MIP images confirms the above findings CTA HEAD FINDINGS Anterior circulation: Atherosclerotic plaque on the carotid siphons. There is a right M1 cut off with poor early collaterals. Hypoplastic left A1 segment. Severe multifocal narrowing of left M2 branches, atheromatous appearing. No related M1 narrowing as it passes superior to the presumed meningioma. Posterior circulation: Symmetric vertebral arteries. Plaque on the vertebral arteries without flow limiting stenosis. Atheromatous irregularity and narrowing of the left more than right PCAs, advanced. No major branch occlusion or aneurysm. Venous sinuses:  Limited venous opacification. Anatomic variants: None significant. Delayed phase: Not performed  in the emergent setting. These results were called by telephone at the time of interpretation on 02/15/2017 at 8:44 pm to Dr. Dorian Pod , who verbally acknowledged these results. Review of the MIP images confirms the above findings IMPRESSION: 1. Emergent large vessel occlusion with right M1 clot. 2. Relatively mild atherosclerosis in the neck without flow limiting stenosis. Bilateral V3 segments and ICAs are somewhat irregular, question FMD. 3. Advanced intracranial atherosclerosis with high-grade left M2 and bilateral P2 segment stenoses. 4. Meningiomas as described on prior noncontrast study. 5. 26 mm left thyroid mass. Electronically Signed   By: Monte Fantasia M.D.   On: 02/15/2017 20:58   Ct Head Wo Contrast  Result Date: 02/15/2017 CLINICAL DATA:  Follow-up examination status post catheter directed intervention for acute right MCA territory stroke. EXAM: CT HEAD WITHOUT CONTRAST TECHNIQUE: Contiguous axial images were obtained from the base of the skull through the vertex without intravenous contrast. COMPARISON:  Prior CTA from earlier same day. FINDINGS: Brain: Stable atrophy with chronic microvascular ischemic disease. No acute intracranial hemorrhage. Possible subtle early changes of acute ischemia noted at the right lentiform nucleus. No other convincing evidence for acute evolving large vessel territory infarct. Intracranial meningiomas again noted. No other mass lesion. No midline shift. No hydrocephalus. No extra-axial fluid collection. Vascular: Contrast material seen throughout the intracranial circulation. Intracranial atherosclerosis again noted. Skull: Scalp soft tissues and calvarium within normal limits. Sinuses/Orbits: Globes and orbital soft tissues normal. Paranasal sinuses and mastoid air cells are clear. IMPRESSION: 1. Probable subtle early changes of evolving acute ischemia involving the right lentiform nucleus. 2. No other acute intracranial process identified. No complication  status post catheter directed revascularization. Electronically Signed   By: Jeannine Boga M.D.   On: 02/15/2017 23:50   Ct Angio Neck W Or Wo Contrast  Result Date: 02/15/2017 CLINICAL DATA:  Code stroke. EXAM: CT ANGIOGRAPHY HEAD AND NECK TECHNIQUE: Multidetector CT imaging of the head and neck was performed using the standard protocol during bolus administration of intravenous contrast. Multiplanar CT image reconstructions and MIPs were obtained to evaluate the vascular anatomy. Carotid stenosis measurements (when applicable) are obtained utilizing NASCET criteria, using the distal internal carotid diameter as the denominator. CONTRAST:  Does not available, reference EMR COMPARISON:  Noncontrast head CT from earlier today. FINDINGS: CTA NECK FINDINGS Aortic arch: Atherosclerotic plaque.  Three vessel branching. Right carotid system: Atherosclerotic plaque at the common carotid bifurcation and proximal ICA. Mild widening of the distal right ICA. No flow limiting stenosis or ulceration. No acute dissection suspected. Left carotid system: Atherosclerosis mainly about the common carotid bifurcation and before the skullbase. The proximal ICA appears diffusely narrow and mildly irregular. Accounting for shelf-like plaque in the posterior bulb, no ulceration is suspected. Vertebral arteries: No proximal subclavian or brachiocephalic stenosis. There is plaque at the left vertebral origin with mild narrowing. Both vertebral arteries are patent to the dura. Mild undulation in widening at the V3 segments, no acute convincing dissection. Skeleton: No acute or aggressive finding. Multilevel cervical facet arthropathy. Other neck: Solid 26 mm nodule in the left thyroid. No invasive features or adenopathy. Upper chest: Unusual appearing pulmonary vein in the left upper lobe, only partly seen. Neighboring pulmonary arteries are symmetrically dense to the right, no suspected fistula. Review of the MIP images confirms  the above findings CTA HEAD FINDINGS Anterior circulation: Atherosclerotic plaque on the carotid siphons. There is a right M1 cut  off with poor early collaterals. Hypoplastic left A1 segment. Severe multifocal narrowing of left M2 branches, atheromatous appearing. No related M1 narrowing as it passes superior to the presumed meningioma. Posterior circulation: Symmetric vertebral arteries. Plaque on the vertebral arteries without flow limiting stenosis. Atheromatous irregularity and narrowing of the left more than right PCAs, advanced. No major branch occlusion or aneurysm. Venous sinuses: Limited venous opacification. Anatomic variants: None significant. Delayed phase: Not performed in the emergent setting. These results were called by telephone at the time of interpretation on 02/15/2017 at 8:44 pm to Dr. Dorian Pod , who verbally acknowledged these results. Review of the MIP images confirms the above findings IMPRESSION: 1. Emergent large vessel occlusion with right M1 clot. 2. Relatively mild atherosclerosis in the neck without flow limiting stenosis. Bilateral V3 segments and ICAs are somewhat irregular, question FMD. 3. Advanced intracranial atherosclerosis with high-grade left M2 and bilateral P2 segment stenoses. 4. Meningiomas as described on prior noncontrast study. 5. 26 mm left thyroid mass. Electronically Signed   By: Monte Fantasia M.D.   On: 02/15/2017 20:58   Mr Brain Wo Contrast  Result Date: 02/16/2017 CLINICAL DATA:  Followup stroke, status post mechanical thrombectomy of RIGHT M1 occlusion February 15, 2017. EXAM: MRI HEAD WITHOUT CONTRAST TECHNIQUE: Multiplanar, multiecho pulse sequences of the brain and surrounding structures were obtained without intravenous contrast. COMPARISON:  CT HEAD February 15, 2017 and CTA HEAD February 15, 2017. FINDINGS: Mild to moderately motion degraded examination. BRAIN: Numerous areas of reduced diffusion within RIGHT temporal lobe, RIGHT occipital lobe, RIGHT frontal  lobe, largest within RIGHT basal ganglia measuring 13 x 7 mm. Areas demonstrate normalized to mildly decreased ADC values. No susceptibility artifact to suggest hemorrhagic conversion though SWAN sequences mildly motion degraded. Ventricles and sulci are normal for patient's age. Old LEFT basal ganglia lacunar infarct. Patchy to confluent supratentorial white matter T2 hyperintensities. Old small RIGHT cerebellar infarcts. No abnormal extra-axial fluid collections. Low signal 2.8 x 2.1 cm mass contiguous with the LEFT anterior clinoid. VASCULAR: Normal major intracranial vascular flow voids present at skull base. SKULL AND UPPER CERVICAL SPINE: No abnormal sellar expansion. No suspicious calvarial bone marrow signal. Craniocervical junction maintained. SINUSES/ORBITS: The mastoid air-cells and included paranasal sinuses are well-aerated. The included ocular globes and orbital contents are non-suspicious. OTHER: None. IMPRESSION: 1. Motion degraded examination. Acute to possibly subacute multifocal small RIGHT MCA territory infarcts without hemorrhagic conversion. 2. Moderate chronic small vessel ischemic disease. Old LEFT basal ganglia and RIGHT cerebellar small infarcts. 3. 2.8 x 2.1 cm LEFT paraclinoid meningioma. Electronically Signed   By: Elon Alas M.D.   On: 02/16/2017 17:06   Ir US Guide Vasc Access Right  Result Date: 02/16/2017 INDICATION: 83 year old female with a history of acute right MCA syndrome with confirmed right M1 occlusion, referred for mechanical thrombectomy. EXAM: ULTRASOUND-GUIDED RIGHT COMMON FEMORAL ARTERY ACCESS RIGHT CERVICAL AND CEREBRAL ANGIOGRAM MECHANICAL THROMBECTOMY OF RIGHT MCA OCCLUSION SECONDARY TO THROMBOEMBOLISM COMPARISON:  CT and CT angiogram 02/15/2017 MEDICATIONS: 2.0 g Ancef. The antibiotic was administered within 1 hour of the procedure ANESTHESIA/SEDATION: General endotracheal tube anesthesia with the anesthesia team CONTRAST:  75 cc Isovue 300 FLUOROSCOPY  TIME:  Fluoroscopy Time: 17 minutes 36 seconds (1,001 mGy). COMPLICATIONS: None TECHNIQUE: Informed written consent was obtained from the patient after a thorough discussion of the procedural risks, benefits and alternatives. All questions were addressed. Maximal Sterile Barrier Technique was utilized including caps, mask, sterile gowns, sterile gloves, sterile drape, hand hygiene and skin antiseptic. A  timeout was performed prior to the initiation of the procedure. FINDINGS: Initial angiogram: Right common carotid artery: Tortuous course with no significant stenosis or calcified disease of the common carotid artery. Right external carotid artery: Patent with antegrade flow. Right internal carotid artery: Mild irregular plaque at the right carotid bifurcation with no significant stenosis. Tortuous course of the cervical right ICA with no significant narrowing. Transient vasospasm present in the proximal cervical ICA with placement of the balloon guide catheter. Conical shaped out pouching from the carotid terminus favored to represent infundibulum. No significant irregular plaque of the intracranial ICA. Right MCA: Proximal occlusion of the right MCA, with patency maintained of a small proximal temporal branch. Irregular contrast margin at the proximal face of the occlusion. Right ACA: Patent A1 segment with large caliber. The right A1 perfuses both the right and left anterior cerebral artery, with a common a 2 segment. Incomplete characterization of a left-sided fenestration involving the left A1 segment. TICI score:  Right MCA TICI 0 Completion angiogram: Right internal carotid artery: Near complete resolution of the vasospasm at the completion of the case and withdrawal of the balloon guide catheter to the common carotid artery. Forward flow maintained. Right MCA: After treatment, there is restoration of complete flow through the middle cerebral artery with slight irregularity at the origin of the superior and  inferior division of the MCA. No luminal thrombus or filling defect. Known embolization to new territory identified. There are 2 separate small temporal lobe branches from the inferior aspect of the middle cerebral artery, patent. Lenticulostriate branches patent. Right ACA: A 1 segment patent. Continued filling of the bilateral anterior cerebral artery from the right-sided circulation TICI score:  Right MCA TICI 3 PROCEDURE: Informed written consent was obtained from the patient, with signing of the consent as essentially emergent consent given the absence of family, after a thorough discussion of the procedural risks, benefits and alternatives. Specific risks addressed include: Bleeding, infection, contrast reaction, kidney injury/failure, need for further procedure/surgery, arterial injury or dissection, embolization to new territory, intracranial hemorrhage (10-15% risk), neurologic deterioration, cardiopulmonary collapse, death. All questions were addressed. Maximal Sterile Barrier Technique was utilized including during the procedure including caps, mask, sterile gowns, sterile gloves, sterile drape, hand hygiene and skin antiseptic. A timeout was performed prior to the initiation of the procedure. Ultrasound survey of the right inguinal region was performed with images stored and sent to PACs. A micropuncture needle was used access the right common femoral artery under ultrasound. With excellent arterial blood flow returned, an .018 micro wire was passed through the needle, observed to enter the abdominal aorta under fluoroscopy. The needle was removed, and a micropuncture sheath was placed over the wire. The inner dilator and wire were removed, and an 035 Bentson wire was advanced under fluoroscopy into the abdominal aorta. The sheath was removed and a standard 5 [place] vascular sheath was placed. The dilator was removed and the sheath was flushed. A 30F JB-1 diagnostic catheter was advanced over the wire to  the proximal descending thoracic aorta. Wire was then removed. Double flush of the catheter was performed. Catheter was then used to select the innominate artery. Angiogram was performed. Using roadmap technique, the catheter was advanced over a roadrunner wire into right common carotid artery. Formal angiogram was performed. The JB 1 catheter was then navigated over the roadrunner wire into the cervical ICA. Wire was removed. Exchange length Rosen wire was then passed through the diagnostic catheter to the cervical carotid artery and  the diagnostic catheter was removed. The 5 French sheath was removed and exchanged for 8 French 55 centimeter BrightTip sheath. Sheath was flushed and attached to pressurized and heparinized saline bag for constant forward flow. Then an 8 [place], 85 cm Flowgate balloon tip catheter was prepared on the back table with inflation of the balloon with 50/50 concentration of dilute contrast. The balloon catheter was then advanced over the wire, positioned into the cervical ICA. Copious back flush was performed and the balloon catheter was attached to heparinized and pressurized saline bag for forward flow. Rotating hemostatic valve was attached to the back end of the balloon guide catheter. Angiogram of the intracranial ICA was performed for road map guide. A try axial intermediate catheter and microcatheter system was assembled on the back table including a 132 cm CAT 5 (Stryker) catheter with RHV, and a 150 cm coaxial Trevo Provue18 microcatheter. A synchro soft 014 wire was back-loaded with tip shape performed before insertion. Catheter system was then introduced through the balloon guide catheter, leading with the micro wire. Microcatheter system was advanced into the internal carotid artery, to the level of the occlusion. The micro wire was then carefully advanced through the occluded segment, with the cat 5 catheter supporting in the supraclinoid ICA. Microcatheter was then push  through the occluded segment and the wire was removed. Blood was then aspirated through the hub of the microcatheter, and a gentle contrast injection was performed confirming intraluminal position. A rotating hemostatic valve was then attached to the back end of the microcatheter, and a pressurized and heparinized saline bag was attached to the catheter. 4 x 40 solitaire device was then selected. Back flush was achieved at the rotating hemostatic valve, and then the device was gently advanced through the microcatheter to the distal end. The retriever was then unsheathed by withdrawing the microcatheter under fluoroscopy. Manual constant aspiration was then performed at the tip of the intermediate guide catheter as the retriever was gently and slowly withdrawn with fluoroscopic observation. As the solitaire device was initially withdrawn, the CT 5 catheter was gently advanced to the ICA terminus and proximal MCA for local aspiration. Once the retriever was entirely removed from the intermediate catheter, free aspiration was confirmed at the hub of the intermediate catheter, with free blood return confirmed. Control angiogram was then performed. Cat 5 was then withdrawn to the cervical ICA and repeat angiogram performed. Cat 5 was removed. The 8 French femoral artery sheath was withdrawn to the iliac artery. Control angiogram was performed at the right common femoral artery puncture site. The 55 centimeter 8 French sheath was exchanged for a standard 8 French sheath at the common femoral artery puncture site. A 7 [place] Exoseal device was deployed for hemostasis. Manual pressure was applied for 15 minutes and a sterile pressure dressing was applied. Patient tolerated the procedure well and remained hemodynamically stable throughout. No complications were encountered and no significant blood loss encountered. IMPRESSION: Status post right cerebral angiogram and mechanical thrombectomy of M1 occlusion contributing to  acute right MCA syndrome with restoration of TICI 3 flow. Signed, Dulcy Fanny. Earleen Newport, DO [facility] Radiology PLAN: Stat head CT Patient remained intubated with right leg straight for 6 hours and compression dressing to be removed in a.m. Admit to neuro ICU Systolic blood pressure target 120-140 Electronically Signed   By: Corrie Mckusick D.O.   On: 02/16/2017 09:42   Dg Chest Port 1 View  Result Date: 02/16/2017 CLINICAL DATA:  Acute respiratory failure with hypoxia. EXAM: PORTABLE CHEST 1 VIEW COMPARISON:  None. FINDINGS: Endotracheal tube tip between the clavicular heads and carina. Mild retrocardiac opacity, likely atelectasis in this setting. No edema, effusion, or pneumothorax. Normal heart size and mediastinal contours. IMPRESSION: 1. Endotracheal tube in good position. 2. Mild retrocardiac atelectasis. Electronically Signed   By: Monte Fantasia M.D.   On: 02/16/2017 07:18   Dg Swallowing Func-speech Pathology  Result Date: 02/17/2017 Objective Swallowing Evaluation: Type of Study: MBS-Modified Barium Swallow Study Patient Details Name: Shelby Jennings MRN: [id number] Date of Birth: [birth year] Today's Date: 02/17/2017 Time: SLP Start Time (ACUTE ONLY): 1158-SLP Stop Time (ACUTE ONLY): 1223 SLP Time Calculation (min) (ACUTE ONLY): 25 min Past Medical History: Past Medical History: Diagnosis Date . DIABETES MELLITUS, TYPE II 06/24/2009 . HYPERLIPIDEMIA 05/09/2007 . HYPERTENSION 05/09/2007 . Morbid obesity ([place])  . OSTEOARTHRITIS 05/09/2007 Past Surgical History: Past Surgical History: Procedure Laterality Date . IR PERCUTANEOUS ART THROMBECTOMY/INFUSION INTRACRANIAL INC DIAG ANGIO  02/15/2017 . IR US GUIDE VASC ACCESS RIGHT  02/15/2017 HPI: Marliyah Reid an 83 y.o.femalewith a past medical history that is significant for HTN, HLD, DM, and reportedly [place] unclear if taking anticoagulation, who at baseline is quite functional and pretty independent but brought to the  ED as a Code Stroke by [facility]. Neighbor saw pt slide out of chair at 1900. Pt with sudden onset of L sidedparalysis, slurred speech, R sided gaze. Code Stroke called.  No family present at bedside and unsuccessful attempt to contact her brother or other family member to obtain further information.  She is currently awake, very dysarthric, mildly confused, with dense L Hemiparesis, L face droop, and R gaze preference.  STAT CT head showed no acute abnormality, ASPECTS 10, but evidence of a large 2. 3 cm extra-axial mass along the left anterior clinoid, partly calcified and most consistent with a meningioma.as well as probable additional 17 x 8 mm left posterior fossa meningioma.  CTA head revealed R M1 occlusion withpoor early collaterals.   Patient is s/p thrombectomy.  MRI is showing acute to subacute right MCA territory infarcts and old left basal ganglia and right cerebellar infarcts.   Subjective: The patient was seen in radiology to determine current swallowing physiology and least restrictive diet.  Assessment / Plan / Recommendation CHL IP CLINICAL IMPRESSIONS 02/17/2017 Clinical Impression MBS was completed using thin liquids, nectar thick liquids and pureed material.  The patient presented with oropharyngeal dysphagia.  The oral phase was characterized by oral holding across all textures with premature loss of the bolus which was seen primarily given pureed material.  The oral holding was significant given pureed material.  Oral residue was noted across textures.  The pharyngeal phase of the swallow was characterized by a swallow trigger in the valleculae given pureed material and a swallow trigger in the pyriform sinuses given thin and nectar thick liquids.  Silent penetration into the laryngeal vestibule was noted given tsp sips of thin liquids prior to the swallow.  Silent penetration was seen into the laryngeal vestibule during the swallow given very large cup sips of nectar thick  liquids.  Given small patient controlled straw sips it was not seen.  Use of a chin tuck was attempted given tsp sips of thin liquids and was not sucessful to help prevent the penetration.  Esophageal sweep revealed it slow to clear.  Recommend a dysphagia 1 diet with nectar thick liquids.  The patient should take one small sip at a time.  Given performance with pureed material the patient's oral cavity should be checked between bites/sips to ensure oral cavity is clear prior to next bolus presentation.  She may require cues to swallow.  The patient will require ST follow during acute stay and at next level of care.     SLP Visit Diagnosis Dysphagia, oropharyngeal phase (R13.12) Attention and concentration deficit following -- Frontal lobe and executive function deficit following -- Impact on safety and function Mild aspiration risk   CHL IP TREATMENT RECOMMENDATION 02/17/2017 Treatment Recommendations Therapy as outlined in treatment plan below   Prognosis 02/17/2017 Prognosis for Safe Diet Advancement Fair Barriers to Reach Goals -- Barriers/Prognosis Comment -- [facility]L IP DIET RECOMMENDATION 02/17/2017 SLP Diet Recommendations Dysphagia 1 (Puree) solids;Nectar thick liquid Liquid Administration via Cup Medication Administration Crushed with puree Compensations Slow rate;Small sips/bites;Follow solids with liquid Postural Changes Remain semi-upright after after feeds/meals (Comment);Seated upright at 90 degrees   CHL IP OTHER RECOMMENDATIONS 02/17/2017 Recommended Consults -- Oral Care Recommendations Oral care BID;Oral care before and after PO Other Recommendations Order thickener from pharmacy;Prohibited food (jello, ice cream, thin soups);Have oral suction available   CHL IP FOLLOW UP RECOMMENDATIONS 02/17/2017 Follow up Recommendations Other (comment)   CHL IP FREQUENCY AND DURATION 02/17/2017 Speech Therapy Frequency (ACUTE ONLY) min 2x/week Treatment Duration 2 weeks      CHL IP ORAL PHASE 02/17/2017 Oral Phase Impaired Oral  - Pudding Teaspoon -- Oral - Pudding Cup -- Oral - Honey Teaspoon -- Oral - Honey Cup -- Oral - Nectar Teaspoon Holding of bolus;Delayed oral transit Oral - Nectar Cup Delayed oral transit;Holding of bolus Oral - Nectar Straw Delayed oral transit;Holding of bolus Oral - Thin Teaspoon Delayed oral transit;Holding of bolus Oral - Thin Cup -- Oral - Thin Straw -- Oral - Puree Delayed oral transit;Holding of bolus;Premature spillage Oral - Mech Soft -- Oral - Regular -- Oral - Multi-Consistency -- Oral - Pill -- Oral Phase - Comment --  CHL IP PHARYNGEAL PHASE 02/17/2017 Pharyngeal Phase Impaired Pharyngeal- Pudding Teaspoon -- Pharyngeal -- Pharyngeal- Pudding Cup -- Pharyngeal -- Pharyngeal- Honey Teaspoon -- Pharyngeal -- Pharyngeal- Honey Cup -- Pharyngeal -- Pharyngeal- Nectar Teaspoon Delayed swallow initiation-pyriform sinuses;Delayed swallow initiation-vallecula;Penetration/Aspiration during swallow Pharyngeal Material enters airway, remains ABOVE vocal cords and not ejected out Pharyngeal- Nectar Cup Delayed swallow initiation-pyriform sinuses;Penetration/Aspiration during swallow Pharyngeal Material enters airway, remains ABOVE vocal cords and not ejected out Pharyngeal- Nectar Straw Delayed swallow initiation-pyriform sinuses Pharyngeal -- Pharyngeal- Thin Teaspoon Delayed swallow initiation-pyriform sinuses;Penetration/Aspiration before swallow Pharyngeal Material enters airway, remains ABOVE vocal cords and not ejected out Pharyngeal- Thin Cup -- Pharyngeal -- Pharyngeal- Thin Straw -- Pharyngeal -- Pharyngeal- Puree Delayed swallow initiation-vallecula Pharyngeal -- Pharyngeal- Mechanical Soft -- Pharyngeal -- Pharyngeal- Regular -- Pharyngeal -- Pharyngeal- Multi-consistency -- Pharyngeal -- Pharyngeal- Pill -- Pharyngeal -- Pharyngeal Comment --  CHL IP CERVICAL ESOPHAGEAL PHASE 02/17/2017 Cervical Esophageal Phase WFL Pudding Teaspoon -- Pudding Cup -- Honey Teaspoon -- Honey Cup -- Nectar Teaspoon --  Nectar Cup -- Nectar Straw -- Thin Teaspoon -- Thin Cup -- Thin Straw -- Puree -- Mechanical Soft -- Regular -- Multi-consistency -- Pill -- Cervical Esophageal Comment -- No flowsheet data found. Shelly Flatten, MA, CCC-SLP Acute Rehab SLP [phone] Lamar Sprinkles 02/17/2017, 12:51 PM              Ir Percutaneous Art Thrombectomy/infusion Intracranial Inc Diag Angio  Result Date: 02/16/2017 INDICATION: 83 year old female with a history of acute right MCA syndrome with confirmed right  M1 occlusion, referred for mechanical thrombectomy. EXAM: ULTRASOUND-GUIDED RIGHT COMMON FEMORAL ARTERY ACCESS RIGHT CERVICAL AND CEREBRAL ANGIOGRAM MECHANICAL THROMBECTOMY OF RIGHT MCA OCCLUSION SECONDARY TO THROMBOEMBOLISM COMPARISON:  CT and CT angiogram 02/15/2017 MEDICATIONS: 2.0 g Ancef. The antibiotic was administered within 1 hour of the procedure ANESTHESIA/SEDATION: General endotracheal tube anesthesia with the anesthesia team CONTRAST:  75 cc Isovue 300 FLUOROSCOPY TIME:  Fluoroscopy Time: 17 minutes 36 seconds (1,001 mGy). COMPLICATIONS: None TECHNIQUE: Informed written consent was obtained from the patient after a thorough discussion of the procedural risks, benefits and alternatives. All questions were addressed. Maximal Sterile Barrier Technique was utilized including caps, mask, sterile gowns, sterile gloves, sterile drape, hand hygiene and skin antiseptic. A timeout was performed prior to the initiation of the procedure. FINDINGS: Initial angiogram: Right common carotid artery: Tortuous course with no significant stenosis or calcified disease of the common carotid artery. Right external carotid artery: Patent with antegrade flow. Right internal carotid artery: Mild irregular plaque at the right carotid bifurcation with no significant stenosis. Tortuous course of the cervical right ICA with no significant narrowing. Transient vasospasm present in the proximal cervical ICA with placement of the balloon guide  catheter. Conical shaped out pouching from the carotid terminus favored to represent infundibulum. No significant irregular plaque of the intracranial ICA. Right MCA: Proximal occlusion of the right MCA, with patency maintained of a small proximal temporal branch. Irregular contrast margin at the proximal face of the occlusion. Right ACA: Patent A1 segment with large caliber. The right A1 perfuses both the right and left anterior cerebral artery, with a common a 2 segment. Incomplete characterization of a left-sided fenestration involving the left A1 segment. TICI score:  Right MCA TICI 0 Completion angiogram: Right internal carotid artery: Near complete resolution of the vasospasm at the completion of the case and withdrawal of the balloon guide catheter to the common carotid artery. Forward flow maintained. Right MCA: After treatment, there is restoration of complete flow through the middle cerebral artery with slight irregularity at the origin of the superior and inferior division of the MCA. No luminal thrombus or filling defect. Known embolization to new territory identified. There are 2 separate small temporal lobe branches from the inferior aspect of the middle cerebral artery, patent. Lenticulostriate branches patent. Right ACA: A 1 segment patent. Continued filling of the bilateral anterior cerebral artery from the right-sided circulation TICI score:  Right MCA TICI 3 PROCEDURE: Informed written consent was obtained from the patient, with signing of the consent as essentially emergent consent given the absence of family, after a thorough discussion of the procedural risks, benefits and alternatives. Specific risks addressed include: Bleeding, infection, contrast reaction, kidney injury/failure, need for further procedure/surgery, arterial injury or dissection, embolization to new territory, intracranial hemorrhage (10-15% risk), neurologic deterioration, cardiopulmonary collapse, death. All questions were  addressed. Maximal Sterile Barrier Technique was utilized including during the procedure including caps, mask, sterile gowns, sterile gloves, sterile drape, hand hygiene and skin antiseptic. A timeout was performed prior to the initiation of the procedure. Ultrasound survey of the right inguinal region was performed with images stored and sent to PACs. A micropuncture needle was used access the right common femoral artery under ultrasound. With excellent arterial blood flow returned, an .018 micro wire was passed through the needle, observed to enter the abdominal aorta under fluoroscopy. The needle was removed, and a micropuncture sheath was placed over the wire. The inner dilator and wire were removed, and an 035 Bentson wire was advanced under  fluoroscopy into the abdominal aorta. The sheath was removed and a standard 5 [place] vascular sheath was placed. The dilator was removed and the sheath was flushed. A 56F JB-1 diagnostic catheter was advanced over the wire to the proximal descending thoracic aorta. Wire was then removed. Double flush of the catheter was performed. Catheter was then used to select the innominate artery. Angiogram was performed. Using roadmap technique, the catheter was advanced over a roadrunner wire into right common carotid artery. Formal angiogram was performed. The JB 1 catheter was then navigated over the roadrunner wire into the cervical ICA. Wire was removed. Exchange length Rosen wire was then passed through the diagnostic catheter to the cervical carotid artery and the diagnostic catheter was removed. The 5 French sheath was removed and exchanged for 8 French 55 centimeter BrightTip sheath. Sheath was flushed and attached to pressurized and heparinized saline bag for constant forward flow. Then an 8 [place], 85 cm Flowgate balloon tip catheter was prepared on the back table with inflation of the balloon with 50/50 concentration of dilute contrast. The balloon catheter was then  advanced over the wire, positioned into the cervical ICA. Copious back flush was performed and the balloon catheter was attached to heparinized and pressurized saline bag for forward flow. Rotating hemostatic valve was attached to the back end of the balloon guide catheter. Angiogram of the intracranial ICA was performed for road map guide. A try axial intermediate catheter and microcatheter system was assembled on the back table including a 132 cm CAT 5 (Stryker) catheter with RHV, and a 150 cm coaxial Trevo Provue18 microcatheter. A synchro soft 014 wire was back-loaded with tip shape performed before insertion. Catheter system was then introduced through the balloon guide catheter, leading with the micro wire. Microcatheter system was advanced into the internal carotid artery, to the level of the occlusion. The micro wire was then carefully advanced through the occluded segment, with the cat 5 catheter supporting in the supraclinoid ICA. Microcatheter was then push through the occluded segment and the wire was removed. Blood was then aspirated through the hub of the microcatheter, and a gentle contrast injection was performed confirming intraluminal position. A rotating hemostatic valve was then attached to the back end of the microcatheter, and a pressurized and heparinized saline bag was attached to the catheter. 4 x 40 solitaire device was then selected. Back flush was achieved at the rotating hemostatic valve, and then the device was gently advanced through the microcatheter to the distal end. The retriever was then unsheathed by withdrawing the microcatheter under fluoroscopy. Manual constant aspiration was then performed at the tip of the intermediate guide catheter as the retriever was gently and slowly withdrawn with fluoroscopic observation. As the solitaire device was initially withdrawn, the CT 5 catheter was gently advanced to the ICA terminus and proximal MCA for local aspiration. Once the retriever  was entirely removed from the intermediate catheter, free aspiration was confirmed at the hub of the intermediate catheter, with free blood return confirmed. Control angiogram was then performed. Cat 5 was then withdrawn to the cervical ICA and repeat angiogram performed. Cat 5 was removed. The 8 French femoral artery sheath was withdrawn to the iliac artery. Control angiogram was performed at the right common femoral artery puncture site. The 55 centimeter 8 French sheath was exchanged for a standard 8 French sheath at the common femoral artery puncture site. A 7 [place] Exoseal device was deployed for hemostasis. Manual pressure was applied for 15 minutes and  a sterile pressure dressing was applied. Patient tolerated the procedure well and remained hemodynamically stable throughout. No complications were encountered and no significant blood loss encountered. IMPRESSION: Status post right cerebral angiogram and mechanical thrombectomy of M1 occlusion contributing to acute right MCA syndrome with restoration of TICI 3 flow. Signed, Dulcy Fanny. Earleen Newport, DO [facility] Radiology PLAN: Stat head CT Patient remained intubated with right leg straight for 6 hours and compression dressing to be removed in a.m. Admit to neuro ICU Systolic blood pressure target 120-140 Electronically Signed   By: Corrie Mckusick D.O.   On: 02/16/2017 09:42   Ct Head Code Stroke W/o Cm  Result Date: 02/15/2017 CLINICAL DATA:  Code stroke.  Left-sided weakness.  Slurred speech. EXAM: CT HEAD WITHOUT CONTRAST TECHNIQUE: Contiguous axial images were obtained from the base of the skull through the vertex without intravenous contrast. COMPARISON:  None. FINDINGS: Brain: No acute hemorrhage or visible infarct. Extra-axial mass in the left middle cranial fossa measuring 3 cm, with patchy internal mineralization. This is contiguous with the anterior clinoid process and most consistent with a meningioma.  Based on the calcification the mass is solid and should not represent an aneurysm. There is likely a sessile dural-based mass in the upper left posterior fossa measuring 8 x 17 mm. No parenchymal edema neighboring the masses. Vascular: No hyperdense vessel.  Atherosclerotic calcification. Skull: No acute or aggressive finding.  Hyperostosis interna. Sinuses/Orbits: Gaze to the right Other: These results were called by telephone at the time of interpretation on 02/15/2017 at 8:30 pm to Dr. Aram Beecham, who verbally acknowledged these results. ASPECTS [facility] Stroke Program Early CT Score) - Ganglionic level infarction (caudate, lentiform nuclei, internal capsule, insula, M1-M3 cortex): 7 - Supraganglionic infarction (M4-M6 cortex): 3 Total score (0-10 with 10 being normal): 10 IMPRESSION: 1. No acute finding. ASPECTS is 10. 2. 3 cm extra-axial mass along the left anterior clinoid, partly calcified and most consistent with a meningioma. 3. Probable additional 17 x 8 mm left posterior fossa meningioma. Electronically Signed   By: Monte Fantasia M.D.   On: 02/15/2017 20:37    Labs:  CBC:  Recent Labs  02/15/17 2350 02/16/17 0015 02/17/17 0157 02/18/17 0359  WBC 7.5 7.8 9.4 8.7  HGB 12.0 11.8* 13.5 14.3  HCT 36.8 36.1 41.6 45.0  PLT 190 191 237 234    COAGS:  Recent Labs  02/15/17 2016  INR 1.00  APTT 28    BMP:  Recent Labs  02/15/17 2016 02/15/17 2020 02/16/17 0427 02/17/17 0157 02/18/17 0359  NA 139 143 140 140 139  K 4.1 4.7 3.8 3.3* 3.8  CL 110 108 112* 107 107  CO2 21*  --  22 24 25   GLUCOSE 137* 139* 135* 127* 120*  BUN 11 15 8  <5* 5*  CALCIUM 8.8*  --  8.1* 8.5* 8.7*  CREATININE 0.88 0.80 0.79 0.68 0.77  GFRNONAA 56*  --  >60 >60 >60  GFRAA >60  --  >60 >60 >60    LIVER FUNCTION TESTS:  Recent Labs  02/15/17 2016 02/16/17 0427 02/17/17 0157 02/18/17 0359  BILITOT 0.5  --   --   --   AST 20  --   --   --   ALT 12*  --   --   --   ALKPHOS 101  --   --   --     PROT 6.9  --   --   --   ALBUMIN 3.6  2.9* 3.4* 3.1*    Assessment and Plan:  CVA R MCA revascularization in IR 7/6 Plan per Stroke MD  Electronically Signed: Vivaan Helseth A, PA-C 02/19/2017, 10:57 AM   I spent a total of 15 Minutes at the the patient's bedside AND on the patient's hospital floor or unit, greater than 50% of which was counseling/coordinating care for CVA; R MCA revasc

## 2017-02-19 NOTE — Progress Notes (Signed)
Physical Therapy Treatment Patient Details Name: Shelby Jennings MRN: [id number] DOB: 12/16/1923 Today's Date: 02/19/2017    History of Present Illness 83 y.o. female admitted on 02/15/17 for left sided weakness and slurred speech. CTA head revealed R M1 occlusion and pt underwent clot retrieval in IR on day of admission.  She was intubated post proceedure and extuabted 02/16/17.  Pt with significant PMH of OA, DM 2, and HTN.      PT Comments    Pt progressing slowly towards physical therapy goals. Was able to  Progress gait training this session and achieved ~5 feet with RW and +2 assist. Overall pt more alert and communicative this session. Feel pt may have a difficult time accommodating the intensity level of CIR level rehab, and that continued PT at the SNF level may be more appropriate. Follow-up recommendations and acute frequency updated to reflect this change. Will continue to follow and progress as able per POC.     Follow Up Recommendations  SNF;Supervision/Assistance - 24 hour     Equipment Recommendations  3in1 (PT);Rolling walker with 5" wheels (L platform)    Recommendations for Other Services       Precautions / Restrictions Precautions Precautions: Fall Precaution Comments: left side weak and inattantion.  Restrictions Weight Bearing Restrictions: No    Mobility  Bed Mobility Overal bed mobility: Needs Assistance Bed Mobility: Supine to Sit     Supine to sit: Mod assist;HOB elevated     General bed mobility comments: Pt initiated LE movement to EOB. Pt required min assist for LLE movement and trunk elevation to full sitting. Mod assist required for scoot out to EOB and to get feet on floor. Bed pad used for assist.  Transfers Overall transfer level: Needs assistance Equipment used: Rolling walker (2 wheeled) Transfers: Sit to/from Stand Sit to Stand: Mod assist;+2 physical assistance         General transfer comment: +2 assist provided for power-up to full  standing position. Increased time required for narrowing BOS, and gaining balance. Pt with strong posterior lean and pulling up on the walker, lifting wheels up off the floor. With cues to push down on the walker pt was able to gain standing balance and improve posterior lean.   Ambulation/Gait Ambulation/Gait assistance: Mod assist;+2 physical assistance;+2 safety/equipment Ambulation Distance (Feet): 5 Feet Assistive device: Rolling walker (2 wheeled) Gait Pattern/deviations: Step-to pattern;Decreased stride length;Shuffle;Trunk flexed;Decreased weight shift to left;Decreased dorsiflexion - left;Decreased step length - left Gait velocity: Decreased Gait velocity interpretation: Below normal speed for age/gender General Gait Details: Pt was able to ambulate with RW and +2 for chair follow and balance support. Pt was cued for increased step length on the L side and pt able to make corrective changes.    Stairs            Wheelchair Mobility    Modified Rankin (Stroke Patients Only) Modified Rankin (Stroke Patients Only) Pre-Morbid Rankin Score: No symptoms Modified Rankin: Moderately severe disability     Balance Overall balance assessment: Needs assistance Sitting-balance support: Feet supported;Single extremity supported Sitting balance-Leahy Scale: Poor Sitting balance - Comments: Able to maintain midline positioning with min assist  Postural control: Posterior lean Standing balance support: Bilateral upper extremity supported Standing balance-Leahy Scale: Zero Standing balance comment: +2 required.                             Cognition Arousal/Alertness: Awake/alert Behavior During Therapy: WFL for tasks  assessed/performed Overall Cognitive Status: Impaired/Different from baseline Area of Impairment: Attention;Memory;Following commands;Safety/judgement;Awareness;Problem solving;Orientation                 Orientation Level: Disoriented  to;Situation;Time Current Attention Level: Sustained Memory: Decreased short-term memory Following Commands: Follows one step commands consistently;Follows multi-step commands inconsistently Safety/Judgement: Decreased awareness of safety;Decreased awareness of deficits Awareness: Intellectual Problem Solving: Slow processing;Difficulty sequencing;Requires verbal cues;Requires tactile cues        Exercises      General Comments        Pertinent Vitals/Pain Pain Assessment: No/denies pain    Home Living                      Prior Function            PT Goals (current goals can now be found in the care plan section) Acute Rehab PT Goals Patient Stated Goal: to get back to her independence PT Goal Formulation: With patient Time For Goal Achievement: 03/02/17 Potential to Achieve Goals: Good Progress towards PT goals: Progressing toward goals    Frequency    Min 3X/week      PT Plan Discharge plan needs to be updated;Frequency needs to be updated    Co-evaluation              AM-PAC PT "6 Clicks" Daily Activity  Outcome Measure  Difficulty turning over in bed (including adjusting bedclothes, sheets and blankets)?: Total Difficulty moving from lying on back to sitting on the side of the bed? : Total Difficulty sitting down on and standing up from a chair with arms (e.g., wheelchair, bedside commode, etc,.)?: Total Help needed moving to and from a bed to chair (including a wheelchair)?: A Lot Help needed walking in hospital room?: Total Help needed climbing 3-5 steps with a railing? : Total 6 Click Score: 7    End of Session Equipment Utilized During Treatment: Gait belt Activity Tolerance: Patient tolerated treatment well Patient left: in chair;with call bell/phone within reach;with chair alarm set Nurse Communication: Mobility status PT Visit Diagnosis: Difficulty in walking, not elsewhere classified (R26.2);Hemiplegia and  hemiparesis Hemiplegia - Right/Left: Left Hemiplegia - dominant/non-dominant: Non-dominant Hemiplegia - caused by: Cerebral infarction     Time: 6294-7654 PT Time Calculation (min) (ACUTE ONLY): 19 min  Charges:  $Gait Training: 8-22 mins                    G Codes:       Rolinda Roan, PT, DPT Acute Rehabilitation Services Pager: [email or web address] 02/19/2017, 1:41 PM

## 2017-02-19 NOTE — NC FL2 (Signed)
[place] LEVEL OF CARE SCREENING TOOL     IDENTIFICATION  Patient Name: Shelby Jennings Birthdate: [birth year] Sex: female Admission Date (Current Location): 02/15/2017  [facility] and [place] Number:  Herbalist and Address:  [facility] [place], [place] [address] [place], [place] [address]      Provider Number: [id number]  Attending Physician Name and Address:  Garvin Fila, MD  Relative Name and Phone Number:       Current Level of Care: Hospital Recommended Level of Care: [place] Prior Approval Number:    Date Approved/Denied:   PASRR Number: [id number]  Discharge Plan: SNF    Current Diagnoses: Patient Active Problem List   Diagnosis Date Noted  . Acute right MCA stroke ([place])   . Benign essential HTN   . Diabetes mellitus type 2 in nonobese (HCC)   . Hyperlipidemia   . PAF (paroxysmal atrial fibrillation) ([place])   . Dysphagia, post-stroke   . Acute respiratory failure with hypoxia ([place])   . Stroke ([place]) 02/15/2017  . Stroke with cerebral ischemia ([place]) 02/15/2017  . Memory loss 06/11/2014  . Thyroid mass 03/23/2011  . Encounter for long-term (current) use of other medications 12/21/2010  . Routine general medical examination at a health care facility 12/21/2010  . KNEE PAIN, RIGHT 12/29/2009  . Diabetes ([place]) 06/24/2009  . PERS HX NONCOMPLIANCE W/MED TX PRS HAZARDS HLTH 06/24/2009  . UNSPECIFIED PRURITIC DISORDER 06/24/2007  . Dyslipidemia 05/09/2007  . Essential hypertension 05/09/2007  . DYSHIDROSIS 05/09/2007  . OSTEOARTHRITIS 05/09/2007    Orientation RESPIRATION BLADDER Height & Weight     Self, Situation, Place  Normal External catheter Weight: 162 lb 7.7 oz (73.7 kg) Height:  5\' 7"  (170.2 cm)  BEHAVIORAL SYMPTOMS/MOOD NEUROLOGICAL BOWEL NUTRITION STATUS      Incontinent Diet (Dysphagia 1 with Nectar Thick Liquids)  AMBULATORY STATUS COMMUNICATION OF NEEDS Skin   Limited Assist Verbally  Surgical wounds (Groin Incision)                       Personal Care Assistance Level of Assistance  Bathing, Feeding, Dressing Bathing Assistance: Limited assistance Feeding assistance: Independent Dressing Assistance: Limited assistance     Functional Limitations Info  Sight, Hearing, Speech Sight Info: Adequate Hearing Info: Adequate Speech Info: Adequate    SPECIAL CARE FACTORS FREQUENCY  PT (By licensed PT), OT (By licensed OT), Speech therapy     PT Frequency: 3x week OT Frequency: 3x week     Speech Therapy Frequency: 2x week      Contractures Contractures Info: Not present    Additional Factors Info  Code Status, Allergies, Insulin Sliding Scale Code Status Info: Full Code Allergies Info: Atorvastatin   Insulin Sliding Scale Info: Novolog every 4 hours       Current Medications (02/19/2017):  This is the current hospital active medication list Current Facility-Administered Medications  Medication Dose Route Frequency Provider Last Rate Last Dose  . 0.9 %  sodium chloride infusion   Intravenous Continuous Amie Portland, MD   Stopped at 02/18/17 0900  . acetaminophen (TYLENOL) tablet 650 mg  650 mg Oral Q4H PRN Camilo, Osvaldo A, MD       Or  . acetaminophen (TYLENOL) solution 650 mg  650 mg Per Tube Q4H PRN Camilo, Osvaldo A, MD       Or  . acetaminophen (TYLENOL) suppository 650 mg  650 mg Rectal Q4H PRN Camilo, Osvaldo A,  MD      . aspirin tablet 325 mg  325 mg Oral Daily Patteson, Samuel A, NP   325 mg at 02/19/17 1041  . atorvastatin (LIPITOR) tablet 20 mg  20 mg Oral q1800 Garvin Fila, MD   20 mg at 02/18/17 1732  . heparin injection 5,000 Units  5,000 Units Subcutaneous Q8H Amie Portland, MD   5,000 Units at 02/19/17 0542  . insulin aspart (novoLOG) injection 0-15 Units  0-15 Units Subcutaneous Q4H Eloise Levels, MD   3 Units at 02/19/17 1234  . labetalol (NORMODYNE,TRANDATE) injection 10 mg  10 mg Intravenous Q4H PRN Amie Portland, MD   10 mg at 02/17/17 2112  . MEDLINE mouth rinse  15 mL Mouth Rinse BID Garvin Fila, MD   15 mL at 02/18/17 2125  . ondansetron (ZOFRAN) injection 4 mg  4 mg Intravenous Q6H PRN Corrie Mckusick, DO      . senna-docusate (Senokot-S) tablet 1 tablet  1 tablet Oral QHS PRN Amie Portland, MD         Discharge Medications: Please see discharge summary for a list of discharge medications.  Relevant Imaging Results:  Relevant Lab Results:   Additional Information SSN [id number]   Barbette Or, [place]  I have personally examined this patient, reviewed notes, independently viewed imaging studies, participated in medical decision making and plan of care.ROS completed by me personally and pertinent positives fully documented  I have made any additions or clarifications directly to the above note. Agree with note above.   Antony Contras, MD Medical Director [facility] Pager: [phone] 02/19/2017 3:03 PM

## 2017-02-19 NOTE — Progress Notes (Signed)
STROKE TEAM PROGRESS NOTE   SUBJECTIVE (INTERVAL HISTORY) No family members present.  She is awake and alert and follows all commands appropriately.  Still awaiting bed to transfer out of the ICU.  No clear evidence of history of atrial fibrillation in the patient's EHR.  Start anticoagulation at post-stroke day 7 only if history of atrial fibrillation can be substantiated.  OBJECTIVE Temp:  [98 F (36.7 C)-98.9 F (37.2 C)] 98.7 F (37.1 C) (07/10 1200) Pulse Rate:  [57-114] 103 (07/10 1200) Cardiac Rhythm: Normal sinus rhythm (07/10 0800) Resp:  [14-28] 28 (07/10 1200) BP: (123-176)/(58-132) 123/79 (07/10 1200) SpO2:  [92 %-100 %] 96 % (07/10 1200)  CBC:   Recent Labs Lab 02/17/17 0157 02/18/17 0359  WBC 9.4 8.7  NEUTROABS 4.8 4.7  HGB 13.5 14.3  HCT 41.6 45.0  MCV 94.8 96.4  PLT 237 161    Basic Metabolic Panel:   Recent Labs Lab 02/17/17 0157 02/18/17 0359  NA 140 139  K 3.3* 3.8  CL 107 107  CO2 24 25  GLUCOSE 127* 120*  BUN <5* 5*  CREATININE 0.68 0.77  CALCIUM 8.5* 8.7*  MG 2.0 1.9  PHOS 2.9 3.6    Lipid Panel:     Component Value Date/Time   CHOL 164 02/16/2017 0015   TRIG 133 02/18/2017 2338   HDL 29 (L) 02/16/2017 0015   CHOLHDL 5.7 02/16/2017 0015   VLDL 25 02/16/2017 0015   LDLCALC 110 (H) 02/16/2017 0015   HgbA1c:  Lab Results  Component Value Date   HGBA1C 7.2 (H) 02/16/2017   Urine Drug Screen: No results found for: LABOPIA, COCAINSCRNUR, LABBENZ, AMPHETMU, THCU, LABBARB  Alcohol Level No results found for: ETH  IMAGING  Ct Angio Head W Or Wo Contrast Ct Angio Neck W Or Wo Contrast 02/15/2017 1. Emergent large vessel occlusion with right M1 clot.  2. Relatively mild atherosclerosis in the neck without flow limiting stenosis. Bilateral V3 segments and ICAs are somewhat irregular, question FMD.  3. Advanced intracranial atherosclerosis with high-grade left M2 and bilateral P2 segment stenoses. 4. Meningiomas as described on prior  noncontrast study.  5. 26 mm left thyroid mass.    Ct Head Wo Contrast 02/15/2017 1. Probable subtle early changes of evolving acute ischemia involving the right lentiform nucleus.  2. No other acute intracranial process identified. No complication status post catheter directed revascularization.    Dg Chest Port 1 View 02/16/2017 1. Endotracheal tube in good position.  2. Mild retrocardiac atelectasis.    Ct Head Code Stroke W/o Cm 02/15/2017 1. No acute finding. ASPECTS is 10.  2. 3 cm extra-axial mass along the left anterior clinoid, partly calcified and most consistent with a meningioma.  3. Probable additional 17 x 8 mm left posterior fossa meningioma.    Interventional Radiology - Dr Earleen Newport 02/16/2017 Status post right cerebral angiogram and mechanical thrombectomy of M1 occlusion contributing to acute right MCA syndrome with restoration of TICI 3 flow.   MR Brain WO Contrast 02/16/2017 IMPRESSION: 1. Motion degraded examination. Acute to possibly subacute multifocal small RIGHT MCA territory infarcts without hemorrhagic conversion. 2. Moderate chronic small vessel ischemic disease. Old LEFT basal ganglia and RIGHT cerebellar small infarcts. 3. 2.8 x 2.1 cm LEFT paraclinoid meningioma.    PHYSICAL EXAM Elderly african [place] lady who is not in distress. . Afebrile. Head is nontraumatic. Neck is supple without bruit.    Cardiac exam no murmur or gallop. Lungs are clear to auscultation. Distal pulses are well  felt. Neurological Exam :  Awake alert and interactive dysarthria but can be understood. No aphasia. Follows commands well.. Right gaze preference  Decreased blink to thr threat on the left compared to the right. Pupils equal reactive Follows simple midline commands and moves all 4 extremities against gravity. Mild left hemiparesis 4/5.  Deep tendon reflexes are symmetric. Plantars downgoing sensation is symmetric bilaterally.     ASSESSMENT/PLAN [name] is  a 83 y.o. female with history of osteoarthritis, hypertension, hyperlipidemia, atrial fibrillation (unknown anticoagulation status) and diabetes mellitus presenting with dysarthria, confusion, left hemiparesis, left facial droop, and right gaze preference. She did not receive IV t-PA as it was unclear if she was on anticoagulation prior to admission. The patient underwent thrombectomy as noted above.  Stroke:  Right middle cerebral artery due to right M1 occlusion status post recanalization with mechanical embolectomy- etiology likely embolic - probably due to atrial fibrillation.  Resultant  Mild left hemiparesis  CT head - Probable subtle early changes of evolving acute ischemia involving the right lentiform nucleus.   MRI head - Motion degraded examination. Acute to possibly subacute multifocal small RIGHT MCA territory infarcts without hemorrhagic conversion.Moderate chronic small vessel ischemic disease. Old LEFT basal ganglia and RIGHT cerebellar small infarcts. 2.8 x 2.1 cm LEFT paraclinoid meningioma.  MRA head - not performed  CTA H&N - Right M1 occlusion with clot. High-grade left M2 and bilateral P2 segment stenoses.   Carotid Doppler - CTA neck  2D Echo -  Left ventricle: The cavity size was normal. Wall thickness was normal. Systolic function was vigorous. The estimated ejection fraction was in the range of 65% to 70%. Wall motion was normal; there were no regional wall motion abnormalities.  LDL - 110  HgbA1c - 7.2  VTE prophylaxis - subcutaneous heparin DIET - DYS 1 Room service appropriate? Yes; Fluid consistency: Nectar Thick  No antithrombotic prior to admission, now on aspirin 325 mg daily.  Patient will be counseled to be compliant with her antithrombotic medications  Ongoing aggressive stroke risk factor management  Therapy recommendations:  CIR, [facility] (family requested)  Disposition: Pending  Hypertension  Stable  Per interventional radiology ->  SBP goal 120 to 140   Long-term BP goal normotensive  Hyperlipidemia  Home meds:  No lipid lowering medications prior to admission  LDL 110, goal < 70  Added Lipitor 20mg  PO daily  Continue statin at discharge  Diabetes  HgbA1c 7.2, goal < 7.0  Unc / Controlled  Other Stroke Risk Factors  Advanced age  Overweight, Body mass index is 25.45 kg/m., recommend weight loss, diet and exercise as appropriate    Other Active Problems  26 mm left thyroid mass (incidental finding)  Meningiomas (incidental finding)  High-grade left M2 and bilateral P2 segment stenoses.   Hospital day # 4  I have personally examined this patient, reviewed notes, independently viewed imaging studies, participated in medical decision making and plan of care.ROS completed by me personally and pertinent positives fully documented  I have made any additions or clarifications directly to the above note.  She presented with right M1 occlusion and underwent successful mechanical embolectomy and appears to be doing well. .  Continue aspirin.  Mobilize out of bed. Physical occupational and speech therapy consults. Transfer to the floor bed later today No family available at bedside for discussion. Rehabilitation consult  no family available at bedside. Transfer to neurology floor bed. Patient likely will be transferred to skilled nursing facility for  rehabilitation over the next few days. Greater than 50% time during this 35 minute visit was spent on coordination of care about patient's stroke, review of evaluation and treatment plan    Antony Contras, MD Medical Director [facility] Pager: [phone] 02/19/2017 1:17 PM   To contact Stroke Continuity provider, please refer to [email or web address] After hours, contact General Neurology

## 2017-02-19 NOTE — Clinical Social Work Placement (Signed)
   CLINICAL SOCIAL WORK PLACEMENT  NOTE  Date:  02/19/2017  Patient Details  Name: Shelby Jennings MRN: [id number] Date of Birth: 01/23/1924  Clinical Social Work is seeking post-discharge placement for this patient at the [place] level of care (*CSW will initial, date and re-position this form in  chart as items are completed):  Yes   Patient/family provided with [facility] Work Department's list of facilities offering this level of care within the geographic area requested by the patient (or if unable, by the patient's family).  Yes   Patient/family informed of their freedom to choose among providers that offer the needed level of care, that participate in Medicare, Medicaid or managed care program needed by the patient, have an available bed and are willing to accept the patient.  Yes   Patient/family informed of [facility]'s ownership interest in [facility] and [facility], as well as of the fact that they are under no obligation to receive care at these facilities.  PASRR submitted to EDS on 02/19/17     PASRR number received on 02/19/17     Existing PASRR number confirmed on       FL2 transmitted to all facilities in geographic area requested by pt/family on 02/19/17     FL2 transmitted to all facilities within larger geographic area on       Patient informed that his/her managed care company has contracts with or will negotiate with certain facilities, including the following:            Patient/family informed of bed offers received.  Patient chooses bed at       Physician recommends and patient chooses bed at      Patient to be transferred to   on  .  Patient to be transferred to facility by       Patient family notified on   of transfer.  Name of family member notified:        PHYSICIAN Please sign FL2     Additional Comment:    Barbette Or, [place]

## 2017-02-19 NOTE — Progress Notes (Signed)
  Speech Language Pathology Treatment: Dysphagia;Cognitive-Linquistic  Patient Details Name: Shelby Jennings MRN: [id number] DOB: Sep 05, 1923 Today's Date: 02/19/2017 Time: 1040-1100 SLP Time Calculation (min) (ACUTE ONLY): 20 min  Assessment / Plan / Recommendation Clinical Impression  Skilled treatment for consumption of 4 oz puree/4 oz of nectar-thickened liquids with mod-max verbal/visual cues provided d/t impulsivity and education required for use of compensatory strategies consistently; pt instituted small sips/bites after moderate verbal cues provided with initial bites/sips of puree/nectar-thickened liquids; pt continues to be oriented to self/situation/place, but not time; simple functional problem solving tasks 50% accurate; decreased awareness of deficits noted during treatment; dysarthric speech improved to an overall 75-100% accuracy given compensatory strategies for increased breath support/volume and over-articulating words with decreased rate improved overall intelligibility within simple conversation; recommend continue Dysphagia 1/nectar-thickened liquids with compensatory strategies in place for safety in consumption of current diet.  ST will continue to f/u while in house and recommend further ST services at next venue of care.  HPI HPI: [name] an 83 y.o.femalewith a past medical history that is significant for HTN, HLD, DM, and reportedly [place] unclear if taking anticoagulation, who at baseline is quite functional and pretty independent but brought to the ED as a Code Stroke by [facility] from [facility]. Neighbor saw pt slide out of chair at 1900. Pt with sudden onset of L sidedparalysis, slurred speech, R sided gaze. Code Stroke called.  No family present at bedside and unsuccessful attempt to contact her brother or other family member to obtain further information.  She is currently awake, very dysarthric, mildly confused, with dense L Hemiparesis, L face droop, and R gaze  preference.  STAT CT head showed no acute abnormality, ASPECTS 10, but evidence of a large 2. 3 cm extra-axial mass along the left anterior clinoid, partly calcified and most consistent with a meningioma.as well as probable additional 17 x 8 mm left posterior fossa meningioma.  CTA head revealed R M1 occlusion withpoor early collaterals.   Patient is s/p thrombectomy.  MRI is showing acute to subacute right MCA territory infarcts and old left basal ganglia and right cerebellar infarcts.        SLP Plan  Continue with current plan of care       Recommendations  Diet recommendations: Dysphagia 1 (puree);Nectar-thick liquid Liquids provided via: Cup;No straw Medication Administration: Whole meds with puree Supervision: Staff to assist with self feeding;Full supervision/cueing for compensatory strategies Compensations: Minimize environmental distractions;Slow rate;Small sips/bites;Multiple dry swallows after each bite/sip Postural Changes and/or Swallow Maneuvers: Seated upright 90 degrees                Oral Care Recommendations: Oral care BID Follow up Recommendations: Other (comment) (TBD) SLP Visit Diagnosis: Dysphagia, oropharyngeal phase (R13.12);Dysarthria and anarthria (R47.1);Cognitive communication deficit (F02.637) Plan: Continue with current plan of care                       Elvina Sidle, M.S., CCC-SLP 02/19/2017, 11:46 AM

## 2017-02-19 NOTE — Plan of Care (Signed)
Problem: Education: Goal: Knowledge of disease or condition will improve Outcome: Progressing Pt aware of stroke, some memory impairment and confusion. Discussed w/ family.  Problem: Self-Care: Goal: Ability to participate in self-care as condition permits will improve Outcome: Progressing Pt needs assistance w/ ADLs, yet does assist with some of self-care (i.e. Feeding self). Goal: Ability to communicate needs accurately will improve Outcome: Progressing Pt able to communicate needs.  Problem: Nutrition: Goal: Risk of aspiration will decrease Outcome: Progressing Nectar thick diet. Goal: Dietary intake will improve Outcome: Progressing Pt has excellent appetite.

## 2017-02-19 NOTE — Progress Notes (Signed)
I met with pt's nephew outside of pt room. He is requesting SNF rehab at [facility] on [address]. I have notified SW. We will sign off. [phone]

## 2017-02-19 NOTE — Clinical Social Work Note (Signed)
Clinical Social Work Assessment  Patient Details  Name: Shelby Jennings MRN: [id number] Date of Birth: [birth year]  Date of referral:  02/19/17               Reason for consult:  Facility Placement                Permission sought to share information with:  Family Supports Permission granted to share information::  Yes, Verbal Permission Granted  Name::     Shelby Jennings  Relationship::  [employer] Information:  [phone]  Housing/Transportation Living arrangements for the past 2 months:  Apartment ([place]) Source of Information:  Patient, Other (Comment Required) Barrister's clerk) Patient Interpreter Needed:  None Criminal Activity/Legal Involvement Pertinent to Current Situation/Hospitalization:  No - Comment as needed Significant Relationships:  Other Family Members Lives with:  Self Do you feel safe going back to the place where you live?  Yes Need for family participation in patient care:  Yes (Comment)  Care giving concerns:  Patient nephew is familiar with facilities and expresses interest in [facility] for placement at discharge.  Patient is agreeable with nephew decisions.   Social Worker assessment / plan:  Holiday representative met with patient and patient nephew at bedside to offer support and discuss patient needs at discharge.  Patient states that she lives at home alone in [place].  Patient nephew and his fiance also live in [place] [facility] [place] and assist patient with necessary things.  Patient initially was not agreeable to SNF placement, however after speaking with her nephew is now in agreement with short term placement.  Patient and patient nephew agree on [facility] as facility of choice.  CSW to initiate referral and follow up with patient and nephew regarding potential bed offer.  CSW remains available for support and to facilitate patient discharge needs once medically stable.  Employment status:  Retired Nurse, adult PT  Recommendations:  [place] / Referral to community resources:  [place]  Patient/Family's Response to care:  Patient and family verbalized understanding of CSW role and appreciation for support and concern.  Patient and family agreeable with short term placement and plans to return to [facility] following.  Patient/Family's Understanding of and Emotional Response to Diagnosis, Current Treatment, and Prognosis:  Patient and family appropriately coping with patient need for short term placement and have limited understanding of patient potential long term barriers with return home alone.  Patient with good family support in the same apartment building.  Emotional Assessment Appearance:  Appears younger than stated age Attitude/Demeanor/Rapport:  Inconsistent, Other (Engaged and Pleasant) Affect (typically observed):  Accepting, Calm, Appropriate, Pleasant Orientation:  Oriented to Self, Oriented to Situation, Oriented to Place Alcohol / Substance use:  Not Applicable Psych involvement (Current and /or in the community):  No (Comment)  Discharge Needs  Concerns to be addressed:  No discharge needs identified Readmission within the last 30 days:  No Current discharge risk:  None Barriers to Discharge:  Continued Medical Work up  [employer], [place]

## 2017-02-20 DIAGNOSIS — M25561 Pain in right knee: Secondary | ICD-10-CM | POA: Diagnosis not present

## 2017-02-20 DIAGNOSIS — I6300 Cerebral infarction due to thrombosis of unspecified precerebral artery: Secondary | ICD-10-CM

## 2017-02-20 DIAGNOSIS — Z0000 Encounter for general adult medical examination without abnormal findings: Secondary | ICD-10-CM | POA: Diagnosis not present

## 2017-02-20 DIAGNOSIS — I10 Essential (primary) hypertension: Secondary | ICD-10-CM | POA: Diagnosis not present

## 2017-02-20 DIAGNOSIS — G8104 Flaccid hemiplegia affecting left nondominant side: Secondary | ICD-10-CM | POA: Diagnosis not present

## 2017-02-20 DIAGNOSIS — L301 Dyshidrosis [pompholyx]: Secondary | ICD-10-CM | POA: Diagnosis not present

## 2017-02-20 DIAGNOSIS — R2681 Unsteadiness on feet: Secondary | ICD-10-CM | POA: Diagnosis not present

## 2017-02-20 DIAGNOSIS — I6782 Cerebral ischemia: Secondary | ICD-10-CM | POA: Diagnosis not present

## 2017-02-20 DIAGNOSIS — M1990 Unspecified osteoarthritis, unspecified site: Secondary | ICD-10-CM | POA: Diagnosis not present

## 2017-02-20 DIAGNOSIS — I63411 Cerebral infarction due to embolism of right middle cerebral artery: Secondary | ICD-10-CM | POA: Diagnosis not present

## 2017-02-20 DIAGNOSIS — I69354 Hemiplegia and hemiparesis following cerebral infarction affecting left non-dominant side: Secondary | ICD-10-CM | POA: Diagnosis not present

## 2017-02-20 DIAGNOSIS — I639 Cerebral infarction, unspecified: Secondary | ICD-10-CM | POA: Diagnosis not present

## 2017-02-20 DIAGNOSIS — M6281 Muscle weakness (generalized): Secondary | ICD-10-CM | POA: Diagnosis not present

## 2017-02-20 DIAGNOSIS — R262 Difficulty in walking, not elsewhere classified: Secondary | ICD-10-CM | POA: Diagnosis not present

## 2017-02-20 DIAGNOSIS — N390 Urinary tract infection, site not specified: Secondary | ICD-10-CM | POA: Diagnosis not present

## 2017-02-20 DIAGNOSIS — E041 Nontoxic single thyroid nodule: Secondary | ICD-10-CM | POA: Diagnosis not present

## 2017-02-20 DIAGNOSIS — Z9119 Patient's noncompliance with other medical treatment and regimen: Secondary | ICD-10-CM | POA: Diagnosis not present

## 2017-02-20 DIAGNOSIS — I63511 Cerebral infarction due to unspecified occlusion or stenosis of right middle cerebral artery: Secondary | ICD-10-CM | POA: Diagnosis not present

## 2017-02-20 DIAGNOSIS — I69391 Dysphagia following cerebral infarction: Secondary | ICD-10-CM | POA: Diagnosis not present

## 2017-02-20 DIAGNOSIS — I480 Paroxysmal atrial fibrillation: Secondary | ICD-10-CM | POA: Diagnosis not present

## 2017-02-20 DIAGNOSIS — Z79899 Other long term (current) drug therapy: Secondary | ICD-10-CM | POA: Diagnosis not present

## 2017-02-20 DIAGNOSIS — J9601 Acute respiratory failure with hypoxia: Secondary | ICD-10-CM | POA: Diagnosis not present

## 2017-02-20 DIAGNOSIS — E785 Hyperlipidemia, unspecified: Secondary | ICD-10-CM | POA: Diagnosis not present

## 2017-02-20 DIAGNOSIS — E119 Type 2 diabetes mellitus without complications: Secondary | ICD-10-CM | POA: Diagnosis not present

## 2017-02-20 DIAGNOSIS — L299 Pruritus, unspecified: Secondary | ICD-10-CM | POA: Diagnosis not present

## 2017-02-20 DIAGNOSIS — R413 Other amnesia: Secondary | ICD-10-CM | POA: Diagnosis not present

## 2017-02-20 LAB — GLUCOSE, CAPILLARY
GLUCOSE-CAPILLARY: 110 mg/dL — AB (ref 65–99)
GLUCOSE-CAPILLARY: 132 mg/dL — AB (ref 65–99)
GLUCOSE-CAPILLARY: 176 mg/dL — AB (ref 65–99)
GLUCOSE-CAPILLARY: 177 mg/dL — AB (ref 65–99)
Glucose-Capillary: 119 mg/dL — ABNORMAL HIGH (ref 65–99)

## 2017-02-20 MED ORDER — ATORVASTATIN CALCIUM 20 MG PO TABS: 20.0000 mg | ORAL_TABLET | Freq: Every day | ORAL | 0 refills | Status: DC

## 2017-02-20 MED ORDER — ASPIRIN 325 MG PO TABS: 325.0000 mg | ORAL_TABLET | Freq: Every day | ORAL | 0 refills | Status: DC

## 2017-02-20 NOTE — Care Management Note (Signed)
Case Management Note  Patient Details  Name: Shelby Jennings MRN: [id number] Date of Birth: 28-Sep-1923  Subjective/Objective:                    Action/Plan: Pt discharging to [facility] today. No further needs per CM.   Expected Discharge Date:  02/20/17               Expected Discharge Plan:  Skilled Nursing Facility  In-House Referral:  Clinical Social Work  Discharge planning Services     Post Acute Care Choice:    Choice offered to:     DME Arranged:    DME Agency:     HH Arranged:    [place] Agency:     Status of Service:     If discussed at [employer], dates discussed:    Additional Comments:  Pollie Friar, RN 02/20/2017, 1:48 PM

## 2017-02-20 NOTE — Progress Notes (Signed)
Patient confused through out the night.   Patient kept trying to get out of bed  Refused heparin subQ - patient stated that "was tired of being stuck all the time and she was not going to be stuck again"

## 2017-02-20 NOTE — Discharge Summary (Signed)
Stroke Discharge Summary  Patient ID: Shelby Jennings   MRN: [id number]      DOB: June 02, 1924  Date of Admission: 02/15/2017 Date of Discharge: 02/20/2017  Attending Physician:  Garvin Fila, MD, Stroke MD Consultant(s):    rehabilitation medicine Patient's PCP:  Renato Shin, MD  DISCHARGE DIAGNOSIS:  Principal Problem:   Stroke [facility]) - Right middle cerebral artery due to right M1 occlusion status post recanalization with mechanical embolectomy- etiology likely embolic - probably due to atrial fibrillation Active Problems:   Stroke with cerebral ischemia ([place])   Acute respiratory failure with hypoxia ([place])   Acute right MCA stroke ([place])   Benign essential HTN   Diabetes mellitus type 2 in nonobese (HCC)   Hyperlipidemia   PAF (paroxysmal atrial fibrillation) ([place])   Dysphagia, post-stroke   Past Medical History:  Diagnosis Date  . DIABETES MELLITUS, TYPE II 06/24/2009  . HYPERLIPIDEMIA 05/09/2007  . HYPERTENSION 05/09/2007  . Morbid obesity ([place])   . OSTEOARTHRITIS 05/09/2007   Past Surgical History:  Procedure Laterality Date  . IR PERCUTANEOUS ART THROMBECTOMY/INFUSION INTRACRANIAL INC DIAG ANGIO  02/15/2017  . IR US GUIDE VASC ACCESS RIGHT  02/15/2017  . RADIOLOGY WITH ANESTHESIA N/A 02/15/2017   Procedure: CODE STROKE;  Surgeon: Radiologist, Medication, MD;  Location: [facility];  Service: Radiology;  Laterality: N/A;    Allergies as of 02/20/2017      Reactions   Atorvastatin Other (See Comments)   Myalgia      Medication List    TAKE these medications   aspirin 325 MG tablet Take 1 tablet (325 mg total) by mouth daily. Start taking on:  02/21/2017   atorvastatin 20 MG tablet Commonly known as:  LIPITOR Take 1 tablet (20 mg total) by mouth daily at 6 PM.   celecoxib 200 MG capsule Commonly known as:  CELEBREX Take 1 capsule (200 mg total) by mouth daily.   cyanocobalamin 1000 MCG/ML injection Commonly known as:  (VITAMIN B-12) Inject 1,000 mcg into the muscle  every 30 (thirty) days. Reported on 10/31/2015   triamcinolone 0.025 % cream Commonly known as:  KENALOG APPLY TOPICALLY AS NEEDED.       LABORATORY STUDIES CBC    Component Value Date/Time   WBC 8.7 02/18/2017 0359   RBC 4.67 02/18/2017 0359   HGB 14.3 02/18/2017 0359   HCT 45.0 02/18/2017 0359   PLT 234 02/18/2017 0359   MCV 96.4 02/18/2017 0359   MCH 30.6 02/18/2017 0359   MCHC 31.8 02/18/2017 0359   RDW 14.2 02/18/2017 0359   LYMPHSABS 3.2 02/18/2017 0359   MONOABS 0.7 02/18/2017 0359   EOSABS 0.1 02/18/2017 0359   BASOSABS 0.0 02/18/2017 0359   CMP    Component Value Date/Time   NA 139 02/18/2017 0359   K 3.8 02/18/2017 0359   CL 107 02/18/2017 0359   CO2 25 02/18/2017 0359   GLUCOSE 120 (H) 02/18/2017 0359   BUN 5 (L) 02/18/2017 0359   CREATININE 0.77 02/18/2017 0359   CREATININE 0.86 05/30/2012 1049   CALCIUM 8.7 (L) 02/18/2017 0359   PROT 6.9 02/15/2017 2016   ALBUMIN 3.1 (L) 02/18/2017 0359   AST 20 02/15/2017 2016   ALT 12 (L) 02/15/2017 2016   ALKPHOS 101 02/15/2017 2016   BILITOT 0.5 02/15/2017 2016   GFRNONAA >60 02/18/2017 0359   GFRAA >60 02/18/2017 0359   COAGS Lab Results  Component Value Date   INR 1.00 02/15/2017   Lipid Panel  Component Value Date/Time   CHOL 164 02/16/2017 0015   TRIG 133 02/18/2017 2338   HDL 29 (L) 02/16/2017 0015   CHOLHDL 5.7 02/16/2017 0015   VLDL 25 02/16/2017 0015   LDLCALC 110 (H) 02/16/2017 0015   HgbA1C  Lab Results  Component Value Date   HGBA1C 7.2 (H) 02/16/2017   Urinalysis    Component Value Date/Time   COLORURINE YELLOW 02/19/2017 1747   APPEARANCEUR CLEAR 02/19/2017 1747   LABSPEC 1.021 02/19/2017 1747   PHURINE 5.0 02/19/2017 1747   GLUCOSEU NEGATIVE 02/19/2017 1747   GLUCOSEU NEGATIVE 05/20/2013 0952   HGBUR NEGATIVE 02/19/2017 1747   BILIRUBINUR NEGATIVE 02/19/2017 1747   KETONESUR NEGATIVE 02/19/2017 1747   PROTEINUR NEGATIVE 02/19/2017 1747   UROBILINOGEN 1.0 05/20/2013 0952    NITRITE NEGATIVE 02/19/2017 1747   LEUKOCYTESUR NEGATIVE 02/19/2017 1747   Urine Drug Screen No results found for: LABOPIA, COCAINSCRNUR, LABBENZ, AMPHETMU, THCU, LABBARB  Alcohol Level No results found for: [facility]   SIGNIFICANT DIAGNOSTIC STUDIES Ct Angio Head W Or Wo Contrast Ct Angio Neck W Or Wo Contrast 02/15/2017 1. Emergent large vessel occlusion with right M1 clot.  2. Relatively mild atherosclerosis in the neck without flow limiting stenosis. Bilateral V3 segments and ICAs are somewhat irregular, question FMD.  3. Advanced intracranial atherosclerosis with high-grade left M2 and bilateral P2 segment stenoses. 4. Meningiomas as described on prior noncontrast study.  5. 26 mm left thyroid mass.    Ct Head Wo Contrast 02/15/2017 1. Probable subtle early changes of evolving acute ischemia involving the right lentiform nucleus.  2. No other acute intracranial process identified. No complication status post catheter directed revascularization.    Dg Chest Port 1 View 02/16/2017 1. Endotracheal tube in good position.  2. Mild retrocardiac atelectasis.    Ct Head Code Stroke W/o Cm 02/15/2017 1. No acute finding. ASPECTS is 10.  2. 3 cm extra-axial mass along the left anterior clinoid, partly calcified and most consistent with a meningioma.  3. Probable additional 17 x 8 mm left posterior fossa meningioma.    Interventional Radiology - Dr Earleen Newport 02/16/2017 Status post right cerebral angiogram and mechanical thrombectomy of M1 occlusion contributing to acute right MCA syndrome with restoration of TICI 3 flow.   MR Brain WO Contrast 02/16/2017 IMPRESSION: 1. Motion degraded examination. Acute to possibly subacute multifocal small RIGHT MCA territory infarcts without hemorrhagic conversion. 2. Moderate chronic small vessel ischemic disease. Old LEFT basal ganglia and RIGHT cerebellar small infarcts. 3. 2.8 x 2.1 cm LEFT paraclinoid meningioma.    HISTORY OF PRESENT  ILLNESS Shelby Jennings an 83 y.o.femalewith a past medical history that is significant for HTN, HLD, DM, and reportedly Afib but unclear if taking anticoagulation, who at baseline is quite functional and pretty independent but brought to the ED as a Code Stroke by [facility]. Neighbor saw pt slide out of chair at 1900. Pt with sudden onset of L sided paralysis, slurred speech, R sided gaze. Code Stroke called. No family present at bedside and unsuccessful attempt to contact her brother or other family member to obtain further information. She is currently awake, very dysarthric, mildly confused, with dense L Hemiparesis, L face droop, and R gaze preference. STAT CT head showed no acute abnormality, ASPECTS 10, but evidence of a large 2. 3 cm extra-axial mass along the left anterior clinoid, partly calcified and most consistent with a meningioma.as well as probable additional 17 x 8 mm left posterior fossa meningioma. CTA head revealed  R M1 occlusion withpoor early collaterals.   Date last known well: 02/15/17 Time last known well: 7 pm tPA Given:no, question of a fib on the chart and unknown if patient taking anticoagulation. NIHSS: 24 MRS: 0  HOSPITAL COURSE Ms. Leontyne Man[place] is a 83 y.o. female with history of osteoarthritis, hypertension, hyperlipidemia, atrial fibrillation (unknown anticoagulation status) and diabetes mellitus presenting with dysarthria, confusion, left hemiparesis, left facial droop, and right gaze preference. She did not receive IV t-PA as it was unclear if she was on anticoagulation prior to admission. The patient underwent thrombectomy as noted above.  Stroke:  Right middle cerebral artery due to right M1 occlusion status post recanalization with mechanical embolectomy- etiology likely embolic - probably due to atrial fibrillation.  Resultant  Mild left hemiparesis  CT head - Probable subtle early changes of evolving acute ischemia involving the right  lentiform nucleus.   MRI head - Motion degraded examination. Acute to possibly subacute multifocal small RIGHT MCA territory infarcts without hemorrhagic conversion.Moderate chronic small vessel ischemic disease. Old LEFT basal ganglia and RIGHT cerebellar small infarcts. 2.8 x 2.1 cm LEFT paraclinoid meningioma.  MRA head - not performed  CTA H&N - Right M1 occlusion with clot. High-grade left M2 and bilateral P2 segment stenoses.   Carotid Doppler - CTA neck  2D Echo -  Left ventricle: The cavity size was normal. Wall thickness was normal. Systolic function was vigorous. The estimated ejectionfraction was in the range of 65% to 70%. Wall motion was normal; there were no regional wall motion abnormalities.  LDL - 110  HgbA1c - 7.2  VTE prophylaxis - subcutaneous heparin  DIET - DYS 1 Room service appropriate? Yes; Fluid consistency: Nectar Thick  No antithrombotic prior to admission, now on No antithrombotic -> ASA suppository for now.  Patient will be counseled to be compliant with her antithrombotic medications  Ongoing aggressive stroke risk factor management  Therapy recommendations:  CIR  Disposition: Discharge to SNF  Hypertension  Stable  Per interventional radiology -> SBP goal 120 to 140  Long-term BP goal normotensive  Hyperlipidemia  Home meds:  No lipid lowering medications prior to admission  LDL 110, goal < 70  Added Lipitor    Continue statin at discharge  Diabetes  HgbA1c 7.2, goal < 7.0  Unc / Controlled  Other Stroke Risk Factors  Advanced age  Overweight, Body mass index is 25.45 kg/m., recommend weight loss, diet and exercise as appropriate   Other Active Problems  26 mm left thyroid mass (incidental finding)  Meningiomas (incidental finding)  High-grade left M2 and bilateral P2 segment stenoses.  DISCHARGE EXAM Blood pressure (!) 145/64, pulse 94, temperature 98.9 F (37.2 C), temperature source Oral, resp. rate 17,  height 5\' 7"  (1.702 m), weight 73.7 kg (162 lb 7.7 oz), SpO2 97 %. PHYSICAL EXAM Elderly african [place] lady who is not in distress.  Afebrile. Head is nontraumatic. Neck is supple without bruit.    Cardiac exam no murmur or gallop. Lungs are clear to auscultation. Distal pulses are well felt. Neurological Exam:  Awake alert and interactive dysarthria but can be understood. No aphasia. Follows commands well.. Right gaze preference  Decreased blink to thr threat on the left compared to the right. Pupils equal reactive Follows simple midline commands and moves all 4 extremities against gravity. Mild left hemiparesis 4/5.  Deep tendon reflexes are symmetric. Plantars downgoing sensation is symmetric bilaterally.     Discharge Diet   DIET - DYS 1 Room service  appropriate? Yes; Fluid consistency: Nectar Thick liquids  DISCHARGE PLAN  Disposition: Discharge to SNF  aspirin 325 mg daily for secondary stroke prevention.  Ongoing risk factor control by Primary Care Physician at time of discharge  Follow-up Renato Shin, MD in 2 weeks.  Follow-up with Dr. Leonie Man, [facility] in 6 weeks, office to schedule an appointment.  30minutes were spent preparing discharge.  I have personally examined this patient, reviewed notes, independently viewed imaging studies, participated in medical decision making and plan of care.ROS completed by me personally and pertinent positives fully documented  I have made any additions or clarifications directly to the above note. Agree with note above.    Antony Contras, MD Medical Director [facility] Pager: [phone] 02/20/2017 4:37 PM

## 2017-02-20 NOTE — Progress Notes (Signed)
Discharge to: [facility] Anticipated discharge date: 02/20/17 Family notified: Yes, by phone Transportation by: [facility]  Report #: [phone], Room 106  CSW signing off.  Laveda Abbe LCSW [phone]

## 2017-02-20 NOTE — Progress Notes (Signed)
CSW met with patient's brother and nephew in the room to discuss discharge plan and inform them of the patient's desire to go to [facility]. Patient's brother and nephew were in agreement.   CSW will follow to facilitate discharge.  Laveda Abbe, [place] Clinical Social Worker [phone]

## 2017-02-20 NOTE — Care Management Important Message (Signed)
Important Message  Patient Details  Name: Shelby Jennings MRN: [id number] Date of Birth: [birth year]   Medicare Important Message Given:  Yes    Dariella Gillihan Montine Circle 02/20/2017, 1:44 PM

## 2017-02-20 NOTE — Progress Notes (Signed)
Speech Language Pathology Treatment: Dysphagia;Cognitive-Linquistic  Patient Details Name: Shelby Jennings MRN: [id number] DOB: May 10, 1924 Today's Date: 02/20/2017 Time: 1448-1856 SLP Time Calculation (min) (ACUTE ONLY): 29 min  Assessment / Plan / Recommendation Clinical Impression  Pt seen to assess po tolerance, readiness for dietary advancement and provide cognitive linguistic treatment.  Pt sitting upright in bed - able to self feed with set up assist.  She continues with delayed swallow and oral residuals intermittently noted without adequate awareness.  Cues to consume yogurt, nectar liquids or oral suction helpful to mitigate residuals.  Pt will continue to benefit from at least intermittent supervision (but have pt feed herself) = assuring pt is clearing mouth after meals.  Using teach back, educated pt to aspiration precautions and provided in writing.  Pt demonstrates decreased awareness to weakness, dysphagia - requiring max cues.   She is disoriented today stating "there is a Designer, jewellery, can I go?".   Reoriented x3 during session using visual/verbal/tactile cues.  Cognitive linguistic goals focused on major safety issues - demonstrated use of call bell, phone and reviewed need to call for assist due to fall risk. Pt benefited from total cues to acknowledge fall risk and need to call for help.    Will need frequent reinforcement to generalize awareness abilities. At this time, recommend consider advancing diet to Dys3/nectar to allow pt to maximize intake with maximum safety.      HPI HPI: Shelby Jennings an 83 y.o.femalewith a past medical history that is significant for HTN, HLD, DM, and reportedly Afibbut unclear if taking anticoagulation, who at baseline is quite functional and pretty independent but brought to the ED as a Code Stroke by [facility] from [facility]. Neighbor saw pt slide out of chair at 1900. Pt with sudden onset of L sidedparalysis, slurred speech, R  sided gaze. Code Stroke called.  No family present at bedside and unsuccessful attempt to contact her brother or other family member to obtain further information.  She is currently awake, very dysarthric, mildly confused, with dense L Hemiparesis, L face droop, and R gaze preference.  STAT CT head showed no acute abnormality, ASPECTS 10, but evidence of a large 2. 3 cm extra-axial mass along the left anterior clinoid, partly calcified and most consistent with a meningioma.as well as probable additional 17 x 8 mm left posterior fossa meningioma.  CTA head revealed R M1 occlusion withpoor early collaterals.   Patient is s/p thrombectomy.  MRI is showing acute to subacute right MCA territory infarcts and old left basal ganglia and right cerebellar infarcts.        SLP Plan  Continue with current plan of care       Recommendations  Diet recommendations: Dysphagia 3 (mechanical soft);Nectar-thick liquid;Other(comment) (ground meats) Liquids provided via: Cup;No straw Medication Administration: Whole meds with puree Supervision: Staff to assist with self feeding;Full supervision/cueing for compensatory strategies Compensations: Minimize environmental distractions;Slow rate;Small sips/bites;Multiple dry swallows after each bite/sip (oral suction prn) Postural Changes and/or Swallow Maneuvers: Seated upright 90 degrees;Upright 30-60 min after meal                Oral Care Recommendations: Oral care BID Follow up Recommendations: Other (comment) (TBD) SLP Visit Diagnosis: Dysphagia, oropharyngeal phase (R13.12);Dysarthria and anarthria (R47.1);Cognitive communication deficit (R41.841) Plan: Continue with current plan of care       [place], [facility] SLP [phone]

## 2017-02-21 DIAGNOSIS — G8104 Flaccid hemiplegia affecting left nondominant side: Secondary | ICD-10-CM | POA: Diagnosis not present

## 2017-02-21 DIAGNOSIS — I10 Essential (primary) hypertension: Secondary | ICD-10-CM | POA: Diagnosis not present

## 2017-02-21 DIAGNOSIS — E119 Type 2 diabetes mellitus without complications: Secondary | ICD-10-CM | POA: Diagnosis not present

## 2017-02-21 DIAGNOSIS — E785 Hyperlipidemia, unspecified: Secondary | ICD-10-CM | POA: Diagnosis not present

## 2017-02-21 NOTE — Consult Note (Signed)
           [facility] CM Primary Care Navigator  02/21/2017  Morgane Joerger 1923-12-22 [id number]   Went to see patient at the bedside earlier to identify possible discharge needs but she was already discharged.  Patient was discharged to skilled nursing facility([facility]) yesterday prior to returning back home per therapy recommendation.  Per patient's chart, patient was admitted for left sided weakness and slurred speech (stroke with cerebral ischemia).  Primary care provider's office called ([facility]) and notified of patient's discharge, need for post skilled nursing facility discharge follow-up and transition of care as well as health issues needing follow-up.   Made aware to refer to [facility] care management if deemed appropriate for services   For questions, please contact:  Dannielle Huh, BSN, RN- [facility] Primary Care Navigator  Telephone: [phone] [place]

## 2017-02-22 ENCOUNTER — Telehealth: Payer: Self-pay | Admitting: Endocrinology

## 2017-02-22 NOTE — Telephone Encounter (Signed)
[facility] calling to notify that patient was discharged from [facility] and went to skilled nursing facility "[facility]" for short term rehab.   Lipitor added to meds and she needs long term blood pressure checks.  Thank you,  -LL

## 2017-02-22 NOTE — Telephone Encounter (Signed)
See message to be advised.  

## 2017-02-25 DIAGNOSIS — G8104 Flaccid hemiplegia affecting left nondominant side: Secondary | ICD-10-CM | POA: Diagnosis not present

## 2017-02-27 DIAGNOSIS — I10 Essential (primary) hypertension: Secondary | ICD-10-CM | POA: Diagnosis not present

## 2017-02-27 DIAGNOSIS — E119 Type 2 diabetes mellitus without complications: Secondary | ICD-10-CM | POA: Diagnosis not present

## 2017-02-27 DIAGNOSIS — E785 Hyperlipidemia, unspecified: Secondary | ICD-10-CM | POA: Diagnosis not present

## 2017-02-28 DIAGNOSIS — E119 Type 2 diabetes mellitus without complications: Secondary | ICD-10-CM | POA: Diagnosis not present

## 2017-02-28 DIAGNOSIS — I10 Essential (primary) hypertension: Secondary | ICD-10-CM | POA: Diagnosis not present

## 2017-02-28 DIAGNOSIS — I480 Paroxysmal atrial fibrillation: Secondary | ICD-10-CM | POA: Diagnosis not present

## 2017-02-28 DIAGNOSIS — I63411 Cerebral infarction due to embolism of right middle cerebral artery: Secondary | ICD-10-CM | POA: Diagnosis not present

## 2017-03-05 DIAGNOSIS — G8104 Flaccid hemiplegia affecting left nondominant side: Secondary | ICD-10-CM | POA: Diagnosis not present

## 2017-03-07 ENCOUNTER — Other Ambulatory Visit (HOSPITAL_COMMUNITY): Payer: Self-pay | Admitting: Internal Medicine

## 2017-03-07 DIAGNOSIS — I10 Essential (primary) hypertension: Secondary | ICD-10-CM | POA: Diagnosis not present

## 2017-03-07 DIAGNOSIS — I63411 Cerebral infarction due to embolism of right middle cerebral artery: Secondary | ICD-10-CM | POA: Diagnosis not present

## 2017-03-07 DIAGNOSIS — R1319 Other dysphagia: Secondary | ICD-10-CM

## 2017-03-07 DIAGNOSIS — E119 Type 2 diabetes mellitus without complications: Secondary | ICD-10-CM | POA: Diagnosis not present

## 2017-03-07 DIAGNOSIS — E785 Hyperlipidemia, unspecified: Secondary | ICD-10-CM | POA: Diagnosis not present

## 2017-03-08 DIAGNOSIS — G8104 Flaccid hemiplegia affecting left nondominant side: Secondary | ICD-10-CM | POA: Diagnosis not present

## 2017-03-11 ENCOUNTER — Ambulatory Visit (HOSPITAL_COMMUNITY): Payer: Medicare Other

## 2017-03-11 DIAGNOSIS — M1990 Unspecified osteoarthritis, unspecified site: Secondary | ICD-10-CM | POA: Diagnosis not present

## 2017-03-11 DIAGNOSIS — I69354 Hemiplegia and hemiparesis following cerebral infarction affecting left non-dominant side: Secondary | ICD-10-CM | POA: Diagnosis not present

## 2017-03-11 DIAGNOSIS — R413 Other amnesia: Secondary | ICD-10-CM | POA: Diagnosis not present

## 2017-03-11 DIAGNOSIS — R2681 Unsteadiness on feet: Secondary | ICD-10-CM | POA: Diagnosis not present

## 2017-03-11 DIAGNOSIS — M6281 Muscle weakness (generalized): Secondary | ICD-10-CM | POA: Diagnosis not present

## 2017-03-11 DIAGNOSIS — J9601 Acute respiratory failure with hypoxia: Secondary | ICD-10-CM | POA: Diagnosis not present

## 2017-03-11 DIAGNOSIS — I63411 Cerebral infarction due to embolism of right middle cerebral artery: Secondary | ICD-10-CM | POA: Diagnosis not present

## 2017-03-12 ENCOUNTER — Ambulatory Visit (HOSPITAL_COMMUNITY): Payer: Medicare Other

## 2017-03-12 DIAGNOSIS — M6281 Muscle weakness (generalized): Secondary | ICD-10-CM | POA: Diagnosis not present

## 2017-03-12 DIAGNOSIS — M1990 Unspecified osteoarthritis, unspecified site: Secondary | ICD-10-CM | POA: Diagnosis not present

## 2017-03-12 DIAGNOSIS — I69354 Hemiplegia and hemiparesis following cerebral infarction affecting left non-dominant side: Secondary | ICD-10-CM | POA: Diagnosis not present

## 2017-03-12 DIAGNOSIS — J9601 Acute respiratory failure with hypoxia: Secondary | ICD-10-CM | POA: Diagnosis not present

## 2017-03-12 DIAGNOSIS — I63411 Cerebral infarction due to embolism of right middle cerebral artery: Secondary | ICD-10-CM | POA: Diagnosis not present

## 2017-03-12 DIAGNOSIS — R2681 Unsteadiness on feet: Secondary | ICD-10-CM | POA: Diagnosis not present

## 2017-03-12 DIAGNOSIS — R413 Other amnesia: Secondary | ICD-10-CM | POA: Diagnosis not present

## 2017-03-13 ENCOUNTER — Ambulatory Visit (HOSPITAL_COMMUNITY)
Admission: RE | Admit: 2017-03-13 | Discharge: 2017-03-13 | Disposition: A | Discharge status: Home / Self Care | Payer: Medicare Other | Source: Ambulatory Visit | Attending: Internal Medicine | Admitting: Internal Medicine

## 2017-03-13 DIAGNOSIS — I10 Essential (primary) hypertension: Secondary | ICD-10-CM | POA: Diagnosis not present

## 2017-03-13 DIAGNOSIS — M1990 Unspecified osteoarthritis, unspecified site: Secondary | ICD-10-CM | POA: Insufficient documentation

## 2017-03-13 DIAGNOSIS — E785 Hyperlipidemia, unspecified: Secondary | ICD-10-CM | POA: Diagnosis not present

## 2017-03-13 DIAGNOSIS — R1319 Other dysphagia: Secondary | ICD-10-CM | POA: Diagnosis not present

## 2017-03-13 DIAGNOSIS — E119 Type 2 diabetes mellitus without complications: Secondary | ICD-10-CM | POA: Insufficient documentation

## 2017-03-13 DIAGNOSIS — R1312 Dysphagia, oropharyngeal phase: Secondary | ICD-10-CM | POA: Diagnosis not present

## 2017-03-14 DIAGNOSIS — M6281 Muscle weakness (generalized): Secondary | ICD-10-CM | POA: Diagnosis not present

## 2017-03-14 DIAGNOSIS — I63411 Cerebral infarction due to embolism of right middle cerebral artery: Secondary | ICD-10-CM | POA: Diagnosis not present

## 2017-03-14 DIAGNOSIS — I69391 Dysphagia following cerebral infarction: Secondary | ICD-10-CM | POA: Diagnosis not present

## 2017-03-14 DIAGNOSIS — R2681 Unsteadiness on feet: Secondary | ICD-10-CM | POA: Diagnosis not present

## 2017-03-14 DIAGNOSIS — R262 Difficulty in walking, not elsewhere classified: Secondary | ICD-10-CM | POA: Diagnosis not present

## 2017-03-15 DIAGNOSIS — R262 Difficulty in walking, not elsewhere classified: Secondary | ICD-10-CM | POA: Diagnosis not present

## 2017-03-15 DIAGNOSIS — I63411 Cerebral infarction due to embolism of right middle cerebral artery: Secondary | ICD-10-CM | POA: Diagnosis not present

## 2017-03-15 DIAGNOSIS — M6281 Muscle weakness (generalized): Secondary | ICD-10-CM | POA: Diagnosis not present

## 2017-03-15 DIAGNOSIS — R2681 Unsteadiness on feet: Secondary | ICD-10-CM | POA: Diagnosis not present

## 2017-03-15 DIAGNOSIS — I69391 Dysphagia following cerebral infarction: Secondary | ICD-10-CM | POA: Diagnosis not present

## 2017-03-18 DIAGNOSIS — R262 Difficulty in walking, not elsewhere classified: Secondary | ICD-10-CM | POA: Diagnosis not present

## 2017-03-18 DIAGNOSIS — I63411 Cerebral infarction due to embolism of right middle cerebral artery: Secondary | ICD-10-CM | POA: Diagnosis not present

## 2017-03-18 DIAGNOSIS — I69354 Hemiplegia and hemiparesis following cerebral infarction affecting left non-dominant side: Secondary | ICD-10-CM | POA: Diagnosis not present

## 2017-03-18 DIAGNOSIS — M6281 Muscle weakness (generalized): Secondary | ICD-10-CM | POA: Diagnosis not present

## 2017-03-18 DIAGNOSIS — R2681 Unsteadiness on feet: Secondary | ICD-10-CM | POA: Diagnosis not present

## 2017-03-18 DIAGNOSIS — I69391 Dysphagia following cerebral infarction: Secondary | ICD-10-CM | POA: Diagnosis not present

## 2017-03-19 ENCOUNTER — Telehealth: Payer: Self-pay | Admitting: Endocrinology

## 2017-03-19 NOTE — Telephone Encounter (Signed)
[place] needing verbal consent for speech therapy services 2x times week for 3 weeks for patient.   OK to leave verbal consent on message if no answer.

## 2017-03-20 DIAGNOSIS — M6281 Muscle weakness (generalized): Secondary | ICD-10-CM | POA: Diagnosis not present

## 2017-03-20 DIAGNOSIS — I69391 Dysphagia following cerebral infarction: Secondary | ICD-10-CM | POA: Diagnosis not present

## 2017-03-20 DIAGNOSIS — I63411 Cerebral infarction due to embolism of right middle cerebral artery: Secondary | ICD-10-CM | POA: Diagnosis not present

## 2017-03-20 DIAGNOSIS — R2681 Unsteadiness on feet: Secondary | ICD-10-CM | POA: Diagnosis not present

## 2017-03-20 DIAGNOSIS — R262 Difficulty in walking, not elsewhere classified: Secondary | ICD-10-CM | POA: Diagnosis not present

## 2017-03-21 DIAGNOSIS — I63411 Cerebral infarction due to embolism of right middle cerebral artery: Secondary | ICD-10-CM | POA: Diagnosis not present

## 2017-03-21 DIAGNOSIS — M6281 Muscle weakness (generalized): Secondary | ICD-10-CM | POA: Diagnosis not present

## 2017-03-21 DIAGNOSIS — I69391 Dysphagia following cerebral infarction: Secondary | ICD-10-CM | POA: Diagnosis not present

## 2017-03-21 DIAGNOSIS — R2681 Unsteadiness on feet: Secondary | ICD-10-CM | POA: Diagnosis not present

## 2017-03-21 DIAGNOSIS — R262 Difficulty in walking, not elsewhere classified: Secondary | ICD-10-CM | POA: Diagnosis not present

## 2017-03-21 NOTE — Telephone Encounter (Signed)
Speech pathologist has not heard anything back and can not see the patient until the note is reviewed. Please call [phone] to advise.

## 2017-03-22 ENCOUNTER — Emergency Department (HOSPITAL_BASED_OUTPATIENT_CLINIC_OR_DEPARTMENT_OTHER)
Admission: EM | Admit: 2017-03-22 | Discharge: 2017-03-22 | Disposition: A | Discharge status: Home / Self Care | Payer: Medicare Other | Attending: Emergency Medicine | Admitting: Emergency Medicine

## 2017-03-22 ENCOUNTER — Emergency Department (HOSPITAL_BASED_OUTPATIENT_CLINIC_OR_DEPARTMENT_OTHER): Payer: Medicare Other

## 2017-03-22 ENCOUNTER — Encounter (HOSPITAL_BASED_OUTPATIENT_CLINIC_OR_DEPARTMENT_OTHER): Payer: Self-pay | Admitting: *Deleted

## 2017-03-22 DIAGNOSIS — Z7982 Long term (current) use of aspirin: Secondary | ICD-10-CM | POA: Insufficient documentation

## 2017-03-22 DIAGNOSIS — K047 Periapical abscess without sinus: Secondary | ICD-10-CM | POA: Diagnosis not present

## 2017-03-22 DIAGNOSIS — R2681 Unsteadiness on feet: Secondary | ICD-10-CM | POA: Diagnosis not present

## 2017-03-22 DIAGNOSIS — I10 Essential (primary) hypertension: Secondary | ICD-10-CM | POA: Diagnosis not present

## 2017-03-22 DIAGNOSIS — Z79899 Other long term (current) drug therapy: Secondary | ICD-10-CM | POA: Insufficient documentation

## 2017-03-22 DIAGNOSIS — R220 Localized swelling, mass and lump, head: Secondary | ICD-10-CM | POA: Diagnosis present

## 2017-03-22 DIAGNOSIS — E119 Type 2 diabetes mellitus without complications: Secondary | ICD-10-CM | POA: Diagnosis not present

## 2017-03-22 DIAGNOSIS — I69391 Dysphagia following cerebral infarction: Secondary | ICD-10-CM | POA: Diagnosis not present

## 2017-03-22 DIAGNOSIS — R262 Difficulty in walking, not elsewhere classified: Secondary | ICD-10-CM | POA: Diagnosis not present

## 2017-03-22 DIAGNOSIS — M6281 Muscle weakness (generalized): Secondary | ICD-10-CM | POA: Diagnosis not present

## 2017-03-22 DIAGNOSIS — R221 Localized swelling, mass and lump, neck: Secondary | ICD-10-CM | POA: Diagnosis not present

## 2017-03-22 DIAGNOSIS — I63411 Cerebral infarction due to embolism of right middle cerebral artery: Secondary | ICD-10-CM | POA: Diagnosis not present

## 2017-03-22 HISTORY — DX: Cerebral infarction, unspecified: I63.9

## 2017-03-22 LAB — CBC WITH DIFFERENTIAL/PLATELET
BASOS PCT: 0 %
Basophils Absolute: 0 10*3/uL (ref 0.0–0.1)
Eosinophils Absolute: 0.1 10*3/uL (ref 0.0–0.7)
Eosinophils Relative: 1 %
HEMATOCRIT: 42.1 % (ref 36.0–46.0)
HEMOGLOBIN: 14.1 g/dL (ref 12.0–15.0)
LYMPHS ABS: 2.5 10*3/uL (ref 0.7–4.0)
LYMPHS PCT: 23 %
MCH: 31.5 pg (ref 26.0–34.0)
MCHC: 33.5 g/dL (ref 30.0–36.0)
MCV: 94.2 fL (ref 78.0–100.0)
Monocytes Absolute: 1.1 10*3/uL — ABNORMAL HIGH (ref 0.1–1.0)
Monocytes Relative: 10 %
NEUTROS ABS: 7.2 10*3/uL (ref 1.7–7.7)
NEUTROS PCT: 66 %
Platelets: 287 10*3/uL (ref 150–400)
RBC: 4.47 MIL/uL (ref 3.87–5.11)
RDW: 13.9 % (ref 11.5–15.5)
WBC: 10.9 10*3/uL — ABNORMAL HIGH (ref 4.0–10.5)

## 2017-03-22 LAB — BASIC METABOLIC PANEL
Anion gap: 12 (ref 5–15)
BUN: 10 mg/dL (ref 6–20)
CHLORIDE: 103 mmol/L (ref 101–111)
CO2: 24 mmol/L (ref 22–32)
CREATININE: 0.94 mg/dL (ref 0.44–1.00)
Calcium: 9.1 mg/dL (ref 8.9–10.3)
GFR calc non Af Amer: 51 mL/min — ABNORMAL LOW (ref >60)
GFR, EST AFRICAN AMERICAN: 60 mL/min — AB (ref >60)
GLUCOSE: 124 mg/dL — AB (ref 65–99)
Potassium: 4.2 mmol/L (ref 3.5–5.1)
Sodium: 139 mmol/L (ref 135–145)

## 2017-03-22 MED ORDER — TRAMADOL HCL 50 MG PO TABS: 50.0000 mg | ORAL_TABLET | Freq: Four times a day (QID) | ORAL | 0 refills | Status: DC | PRN

## 2017-03-22 MED ORDER — CLINDAMYCIN PHOSPHATE 900 MG/50ML IV SOLN
900.0000 mg | Freq: Once | INTRAVENOUS | Status: AC
  Administered 2017-03-22: 900 mg via INTRAVENOUS
  Filled 2017-03-22: qty 50

## 2017-03-22 MED ORDER — IOPAMIDOL (ISOVUE-300) INJECTION 61%
100.0000 mL | Freq: Once | INTRAVENOUS | Status: AC | PRN
  Administered 2017-03-22: 75 mL via INTRAVENOUS

## 2017-03-22 MED ORDER — CLINDAMYCIN HCL 150 MG PO CAPS: 450.0000 mg | ORAL_CAPSULE | Freq: Three times a day (TID) | ORAL | 0 refills | Status: AC

## 2017-03-22 NOTE — Telephone Encounter (Signed)
Routing to you °

## 2017-03-22 NOTE — ED Triage Notes (Signed)
Swelling to her right jaw. Dental pain prior to swelling.

## 2017-03-22 NOTE — ED Provider Notes (Signed)
[facility] DEPT MHP Provider Note   CSN: [id number] Arrival date & time: 03/22/17  1214     History   Chief Complaint Chief Complaint  Patient presents with  . Abscess    HPI Shelby Jennings is a 83 y.o. female.  Patient brought in by family members for [facility] nursing facility. Patient was swelling to her right lower jaw area. Patient did have complaint of tooth pain prior to the swelling. Patient denies any significant pain now. The swelling came up over the past 2 days.      Past Medical History:  Diagnosis Date  . DIABETES MELLITUS, TYPE II 06/24/2009  . HYPERLIPIDEMIA 05/09/2007  . HYPERTENSION 05/09/2007  . Morbid obesity ([place])   . OSTEOARTHRITIS 05/09/2007  . Stroke [facility])     Patient Active Problem List   Diagnosis Date Noted  . Acute right MCA stroke ([place])   . Benign essential HTN   . Diabetes mellitus type 2 in nonobese (HCC)   . Hyperlipidemia   . PAF (paroxysmal atrial fibrillation) ([place])   . Dysphagia, post-stroke   . Acute respiratory failure with hypoxia ([place])   . Stroke [facility]) - Right middle cerebral artery due to right M1 occlusion status post recanalization with mechanical embolectomy- etiology likely embolic - probably due to atrial fibrillation 02/15/2017  . Stroke with cerebral ischemia ([place]) 02/15/2017  . Memory loss 06/11/2014  . Thyroid mass 03/23/2011  . Encounter for long-term (current) use of other medications 12/21/2010  . Routine general medical examination at a health care facility 12/21/2010  . KNEE PAIN, RIGHT 12/29/2009  . Diabetes ([place]) 06/24/2009  . PERS HX NONCOMPLIANCE W/MED TX PRS HAZARDS HLTH 06/24/2009  . UNSPECIFIED PRURITIC DISORDER 06/24/2007  . Dyslipidemia 05/09/2007  . Essential hypertension 05/09/2007  . DYSHIDROSIS 05/09/2007  . OSTEOARTHRITIS 05/09/2007    Past Surgical History:  Procedure Laterality Date  . IR PERCUTANEOUS ART THROMBECTOMY/INFUSION INTRACRANIAL INC DIAG ANGIO  02/15/2017  . IR US GUIDE VASC ACCESS  RIGHT  02/15/2017  . RADIOLOGY WITH ANESTHESIA N/A 02/15/2017   Procedure: CODE STROKE;  Surgeon: Radiologist, Medication, MD;  Location: [place] [facility];  Service: Radiology;  Laterality: N/A;    OB History    No data available       Home Medications    Prior to Admission medications   Medication Sig Start Date End Date Taking? Authorizing Provider  aspirin 325 MG tablet Take 1 tablet (325 mg total) by mouth daily. 02/21/17  Yes Patteson, Arlan Organ, NP  atorvastatin (LIPITOR) 20 MG tablet Take 1 tablet (20 mg total) by mouth daily at 6 PM. 02/20/17  Yes Patteson, Arlan Organ, NP  celecoxib (CELEBREX) 200 MG capsule Take 1 capsule (200 mg total) by mouth daily. 01/02/17  Yes Renato Shin, MD  cyanocobalamin (,VITAMIN B-12,) 1000 MCG/ML injection Inject 1,000 mcg into the muscle every 30 (thirty) days. Reported on 10/31/2015   Yes [provider]  triamcinolone (KENALOG) 0.025 % cream APPLY TOPICALLY AS NEEDED. 06/29/15  Yes Renato Shin, MD  clindamycin (CLEOCIN) 150 MG capsule Take 3 capsules (450 mg total) by mouth 3 (three) times daily. 03/22/17 03/29/17  Fredia Sorrow, MD  traMADol (ULTRAM) 50 MG tablet Take 1 tablet (50 mg total) by mouth every 6 (six) hours as needed. 03/22/17   Fredia Sorrow, MD    Family History Family History  Problem Relation Age of Onset  . Cancer Mother        had uncertain type of cancer    Social History  Social History  Substance Use Topics  . Smoking status: Never Smoker  . Smokeless tobacco: Never Used  . Alcohol use No     Allergies   Atorvastatin   Review of Systems Review of Systems  Unable to perform ROS: Dementia  Constitutional: Negative for fever.  HENT: Positive for dental problem and facial swelling.      Physical Exam Updated Vital Signs BP (!) 173/92   Pulse 84   Temp 98.7 F (37.1 C) (Oral)   Resp (!) 24 Comment: pt hyperventilating from IV start  Ht 1.626 m (5\' 4" )   Wt 66.7 kg (147 lb)   SpO2 97%   BMI 25.23 kg/m    Physical Exam  Constitutional: She appears well-developed and well-nourished. No distress.  HENT:  Mouth/Throat: Oropharynx is clear and moist.  Marked swelling to the right side of the jaw. No erythema. No seeming swelling underneath the jaw. No swelling to the floor the mouth. No tenderness to palpation of the lower teeth on that side. Gingiva appears normal.  Eyes: Pupils are equal, round, and reactive to light. Conjunctivae and EOM are normal.  Except for some conjunctival hemorrhage to the right eye. Not involving the anterior chamber.  Neck: Neck supple.  Cardiovascular: Normal rate.   Pulmonary/Chest: Effort normal. No respiratory distress.  Abdominal: Soft. There is no tenderness.  Musculoskeletal: Normal range of motion.  Lymphadenopathy:    She has no cervical adenopathy.  Neurological: She is alert. No cranial nerve deficit or sensory deficit. She exhibits normal muscle tone. Coordination normal.  Skin: Skin is warm.  Nursing note and vitals reviewed.    ED Treatments / Results  Labs (all labs ordered are listed, but only abnormal results are displayed) Labs Reviewed  CBC WITH DIFFERENTIAL/PLATELET - Abnormal; Notable for the following:       Result Value   WBC 10.9 (*)    Monocytes Absolute 1.1 (*)    All other components within normal limits  BASIC METABOLIC PANEL - Abnormal; Notable for the following:    Glucose, Bld 124 (*)    GFR calc non Af Amer 51 (*)    GFR calc Af Amer 60 (*)    All other components within normal limits    EKG  EKG Interpretation None       Radiology Ct Soft Tissue Neck W Contrast  Result Date: 03/22/2017 CLINICAL DATA:  83 year old female with right MCA occlusion in July status post endovascular reperfusion. Right facial pain and redness. Dental pain. Bloodshot right eye. EXAM: CT NECK WITH CONTRAST TECHNIQUE: Multidetector CT imaging of the neck was performed using the standard protocol following the bolus administration of  intravenous contrast. CONTRAST:  6mL ISOVUE-300 IOPAMIDOL (ISOVUE-300) INJECTION 61% COMPARISON:  Brain MRI 02/16/2017, CTA neck 02/15/2017 FINDINGS: Pharynx and larynx: Motion artifact at the level of the pharynx. Pharyngeal soft tissue contours appear to remain normal. Parapharyngeal and retropharyngeal spaces appear within normal limits. Salivary glands: Sublingual spaces stable and negative. Submandibular glands appear within normal limits. Negative parotid glands. Severe soft tissue swelling along the anterior and right mandible. Associated subcutaneous stranding. See dental findings below. No discrete or drainable fluid collection identified. Thyroid: Stable thyromegaly. Lymph nodes: Right level 1 and level 2 lymph nodes are stable and within normal limits. No lymphadenopathy. Vascular: Major vascular structures in the neck and at the skullbase are patent, including the right MCA M1 segment today. Extensive calcified atherosclerosis. Limited intracranial: Enhancing chronic left middle cranial fossa meningioma. Smaller oval 15  mm left tentorial meningioma is also stable. Stable visualized brain parenchyma. Visualized orbits: Negative. Mastoids and visualized paranasal sinuses: Visualized paranasal sinuses and mastoids are stable and well pneumatized. Skeleton: Motion artifact at the mandible. Periapical lucency and dental caries at the right mandible posterior bicuspid. This is in proximity to the severe soft tissue swelling, although it is unclear whether there is a small area of cortical breakthrough. This is also near the foramen for the right mental nerve. No mandible fracture is evident. Additional dental periapical lucency (right maxilla medial incisor). Stable cervical spine degeneration with multilevel mild spondylolisthesis. No acute osseous abnormality identified. Upper chest: Negative lung apices. No superior mediastinal lymphadenopathy. Calcified aortic atherosclerosis. Visible axillary lymph nodes  are normal. IMPRESSION: 1. Severe soft tissue swelling of the right face, along the body of the right mandible. Suspect cellulitis and favor this is odontogenic in origin (right mandible posterior bicuspid). No associated abscess or drainable fluid collection identified. No lymphadenopathy or complicating features. 2. Otherwise stable CT appearance of the neck. 3. Aortic and carotid artery calcified atherosclerosis. 4. Chronic intracranial meningiomas. Electronically Signed   By: Genevie Ann M.D.   On: 03/22/2017 15:32    Procedures Procedures (including critical care time)  Medications Ordered in ED Medications  clindamycin (CLEOCIN) IVPB 900 mg (not administered)  iopamidol (ISOVUE-300) 61 % injection 100 mL (75 mLs Intravenous Contrast Given 03/22/17 1457)     Initial Impression / Assessment and Plan / ED Course  I have reviewed the triage vital signs and the nursing notes.  Pertinent labs & imaging results that were available during my care of the patient were reviewed by me and considered in my medical decision making (see chart for details).     Clinically was suspicious that patient may have a salivary duct the obstruction. But CT scan with contrast shows that it is most likely secondary to a tooth. This does go along with a history that there was some tooth pain before. Teeth appear to be nontender now. No swelling to the floor the mouth. No significant cervical adenopathy. Patient nontoxic no acute distress.  Will treat with IV clindamycin here in continue oral clindamycin have patient follow-up with either her  dentist or oral surgery. Patient also be treated with tramadol for pain. The patient will return for any swelling of the floor the mouth or worsening symptoms. Patient given referral information for Dr. Hoyt Koch.  Final Clinical Impressions(s) / ED Diagnoses   Final diagnoses:  Dental abscess    New Prescriptions New Prescriptions   CLINDAMYCIN (CLEOCIN) 150 MG CAPSULE     Take 3 capsules (450 mg total) by mouth 3 (three) times daily.   TRAMADOL (ULTRAM) 50 MG TABLET    Take 1 tablet (50 mg total) by mouth every 6 (six) hours as needed.     Fredia Sorrow, MD 03/22/17 [id number]

## 2017-03-22 NOTE — Discharge Instructions (Signed)
Take the antibiotic clindamycin as directed. Prescription provided. CT scan showed that the infection is probably coming from a tooth and not from the salivary gland. Follow-up with your dentist or the oral surgeon provided above Dr. Hoyt Koch for further evaluation after taking the antibiotics. Get seen immediately if you get swelling underneath your tong inside your mouth. This will push her tongue up and this would be an emergency.

## 2017-03-22 NOTE — Telephone Encounter (Signed)
OK 

## 2017-03-25 DIAGNOSIS — I69391 Dysphagia following cerebral infarction: Secondary | ICD-10-CM | POA: Diagnosis not present

## 2017-03-25 DIAGNOSIS — M6281 Muscle weakness (generalized): Secondary | ICD-10-CM | POA: Diagnosis not present

## 2017-03-25 DIAGNOSIS — R262 Difficulty in walking, not elsewhere classified: Secondary | ICD-10-CM | POA: Diagnosis not present

## 2017-03-25 DIAGNOSIS — R2681 Unsteadiness on feet: Secondary | ICD-10-CM | POA: Diagnosis not present

## 2017-03-25 DIAGNOSIS — I63411 Cerebral infarction due to embolism of right middle cerebral artery: Secondary | ICD-10-CM | POA: Diagnosis not present

## 2017-03-25 NOTE — Telephone Encounter (Signed)
Called and gave verbal consent.

## 2017-03-26 ENCOUNTER — Telehealth: Payer: Self-pay | Admitting: Endocrinology

## 2017-03-26 DIAGNOSIS — I63411 Cerebral infarction due to embolism of right middle cerebral artery: Secondary | ICD-10-CM | POA: Diagnosis not present

## 2017-03-26 DIAGNOSIS — R262 Difficulty in walking, not elsewhere classified: Secondary | ICD-10-CM | POA: Diagnosis not present

## 2017-03-26 DIAGNOSIS — R2681 Unsteadiness on feet: Secondary | ICD-10-CM | POA: Diagnosis not present

## 2017-03-26 DIAGNOSIS — I69391 Dysphagia following cerebral infarction: Secondary | ICD-10-CM | POA: Diagnosis not present

## 2017-03-26 DIAGNOSIS — M6281 Muscle weakness (generalized): Secondary | ICD-10-CM | POA: Diagnosis not present

## 2017-03-26 NOTE — Telephone Encounter (Signed)
Shelby Jennings, speech therapist wanted to ensure that Dr. Loanne Drilling was aware that the patient went to the ED 03/22/17 for a tooth abscess. No call to [place] necessary. Please advise.

## 2017-03-26 NOTE — Telephone Encounter (Signed)
Routing to you °

## 2017-03-27 DIAGNOSIS — I69391 Dysphagia following cerebral infarction: Secondary | ICD-10-CM | POA: Diagnosis not present

## 2017-03-27 DIAGNOSIS — R2681 Unsteadiness on feet: Secondary | ICD-10-CM | POA: Diagnosis not present

## 2017-03-27 DIAGNOSIS — I63411 Cerebral infarction due to embolism of right middle cerebral artery: Secondary | ICD-10-CM | POA: Diagnosis not present

## 2017-03-27 DIAGNOSIS — R262 Difficulty in walking, not elsewhere classified: Secondary | ICD-10-CM | POA: Diagnosis not present

## 2017-03-27 DIAGNOSIS — M6281 Muscle weakness (generalized): Secondary | ICD-10-CM | POA: Diagnosis not present

## 2017-03-29 DIAGNOSIS — R262 Difficulty in walking, not elsewhere classified: Secondary | ICD-10-CM | POA: Diagnosis not present

## 2017-03-29 DIAGNOSIS — M6281 Muscle weakness (generalized): Secondary | ICD-10-CM | POA: Diagnosis not present

## 2017-03-29 DIAGNOSIS — R2681 Unsteadiness on feet: Secondary | ICD-10-CM | POA: Diagnosis not present

## 2017-03-29 DIAGNOSIS — I63411 Cerebral infarction due to embolism of right middle cerebral artery: Secondary | ICD-10-CM | POA: Diagnosis not present

## 2017-03-29 DIAGNOSIS — I69391 Dysphagia following cerebral infarction: Secondary | ICD-10-CM | POA: Diagnosis not present

## 2017-03-31 ENCOUNTER — Other Ambulatory Visit: Payer: Self-pay | Admitting: Endocrinology

## 2017-04-01 DIAGNOSIS — I63411 Cerebral infarction due to embolism of right middle cerebral artery: Secondary | ICD-10-CM | POA: Diagnosis not present

## 2017-04-01 DIAGNOSIS — R2681 Unsteadiness on feet: Secondary | ICD-10-CM | POA: Diagnosis not present

## 2017-04-01 DIAGNOSIS — R262 Difficulty in walking, not elsewhere classified: Secondary | ICD-10-CM | POA: Diagnosis not present

## 2017-04-01 DIAGNOSIS — I69391 Dysphagia following cerebral infarction: Secondary | ICD-10-CM | POA: Diagnosis not present

## 2017-04-01 DIAGNOSIS — M6281 Muscle weakness (generalized): Secondary | ICD-10-CM | POA: Diagnosis not present

## 2017-04-02 DIAGNOSIS — R2681 Unsteadiness on feet: Secondary | ICD-10-CM | POA: Diagnosis not present

## 2017-04-02 DIAGNOSIS — M6281 Muscle weakness (generalized): Secondary | ICD-10-CM | POA: Diagnosis not present

## 2017-04-02 DIAGNOSIS — I69391 Dysphagia following cerebral infarction: Secondary | ICD-10-CM | POA: Diagnosis not present

## 2017-04-02 DIAGNOSIS — I63411 Cerebral infarction due to embolism of right middle cerebral artery: Secondary | ICD-10-CM | POA: Diagnosis not present

## 2017-04-02 DIAGNOSIS — R262 Difficulty in walking, not elsewhere classified: Secondary | ICD-10-CM | POA: Diagnosis not present

## 2017-04-04 DIAGNOSIS — R262 Difficulty in walking, not elsewhere classified: Secondary | ICD-10-CM | POA: Diagnosis not present

## 2017-04-04 DIAGNOSIS — R2681 Unsteadiness on feet: Secondary | ICD-10-CM | POA: Diagnosis not present

## 2017-04-04 DIAGNOSIS — M6281 Muscle weakness (generalized): Secondary | ICD-10-CM | POA: Diagnosis not present

## 2017-04-04 DIAGNOSIS — I63411 Cerebral infarction due to embolism of right middle cerebral artery: Secondary | ICD-10-CM | POA: Diagnosis not present

## 2017-04-04 DIAGNOSIS — I69391 Dysphagia following cerebral infarction: Secondary | ICD-10-CM | POA: Diagnosis not present

## 2017-04-05 DIAGNOSIS — I69391 Dysphagia following cerebral infarction: Secondary | ICD-10-CM | POA: Diagnosis not present

## 2017-04-05 DIAGNOSIS — I63411 Cerebral infarction due to embolism of right middle cerebral artery: Secondary | ICD-10-CM | POA: Diagnosis not present

## 2017-04-05 DIAGNOSIS — M6281 Muscle weakness (generalized): Secondary | ICD-10-CM | POA: Diagnosis not present

## 2017-04-05 DIAGNOSIS — R262 Difficulty in walking, not elsewhere classified: Secondary | ICD-10-CM | POA: Diagnosis not present

## 2017-04-05 DIAGNOSIS — R2681 Unsteadiness on feet: Secondary | ICD-10-CM | POA: Diagnosis not present

## 2017-04-08 ENCOUNTER — Ambulatory Visit (INDEPENDENT_AMBULATORY_CARE_PROVIDER_SITE_OTHER): Payer: Medicare Other | Admitting: Endocrinology

## 2017-04-08 ENCOUNTER — Encounter: Payer: Self-pay | Admitting: Endocrinology

## 2017-04-08 VITALS — BP 146/84 | HR 75 | Ht 64.0 in | Wt 151.0 lb

## 2017-04-08 DIAGNOSIS — R262 Difficulty in walking, not elsewhere classified: Secondary | ICD-10-CM | POA: Diagnosis not present

## 2017-04-08 DIAGNOSIS — E119 Type 2 diabetes mellitus without complications: Secondary | ICD-10-CM

## 2017-04-08 DIAGNOSIS — I63411 Cerebral infarction due to embolism of right middle cerebral artery: Secondary | ICD-10-CM | POA: Diagnosis not present

## 2017-04-08 DIAGNOSIS — R2681 Unsteadiness on feet: Secondary | ICD-10-CM | POA: Diagnosis not present

## 2017-04-08 DIAGNOSIS — I69391 Dysphagia following cerebral infarction: Secondary | ICD-10-CM | POA: Diagnosis not present

## 2017-04-08 DIAGNOSIS — M6281 Muscle weakness (generalized): Secondary | ICD-10-CM | POA: Diagnosis not present

## 2017-04-08 MED ORDER — DICLOFENAC SODIUM 1 % TD GEL: 4.0000 g | Freq: Four times a day (QID) | TRANSDERMAL | 11 refills | Status: DC

## 2017-04-08 NOTE — Patient Instructions (Addendum)
Here is a prescription, for an anti-pain gel. No medication is needed for diabetes.   Please come back for a follow-up appointment in 3 months.

## 2017-04-08 NOTE — Progress Notes (Signed)
Subjective:    Patient ID: Shelby Jennings, female    DOB: July 26, 1924, 83 y.o.   MRN: [id number]  HPI Pt had CVA 6 weeks ago.  Since then, left facial droop is resolved.  She has slight weakness throughout the body, and assoc memory loss.  Hx is from nephew [name]), who is also [facility].  Pt now lives at [facility] (independent living).   Past Medical History:  Diagnosis Date  . DIABETES MELLITUS, TYPE II 06/24/2009  . HYPERLIPIDEMIA 05/09/2007  . HYPERTENSION 05/09/2007  . Morbid obesity ([place])   . OSTEOARTHRITIS 05/09/2007  . Stroke [facility])     Past Surgical History:  Procedure Laterality Date  . IR PERCUTANEOUS ART THROMBECTOMY/INFUSION INTRACRANIAL INC DIAG ANGIO  02/15/2017  . IR US GUIDE VASC ACCESS RIGHT  02/15/2017  . RADIOLOGY WITH ANESTHESIA N/A 02/15/2017   Procedure: CODE STROKE;  Surgeon: Radiologist, Medication, MD;  Location: [place];  Service: Radiology;  Laterality: N/A;    Social History   Social History  . Marital status: Married    Spouse name: N/A  . Number of children: N/A  . Years of education: N/A   Occupational History  . Retired    Social History Main Topics  . Smoking status: Never Smoker  . Smokeless tobacco: Never Used  . Alcohol use No  . Drug use: No  . Sexual activity: Not on file   Other Topics Concern  . Not on file   Social History Narrative   Widowed    Current Outpatient Prescriptions on File Prior to Visit  Medication Sig Dispense Refill  . aspirin 325 MG tablet Take 1 tablet (325 mg total) by mouth daily. 30 tablet 0  . atorvastatin (LIPITOR) 20 MG tablet Take 1 tablet (20 mg total) by mouth daily at 6 PM. 30 tablet 0  . cyanocobalamin (,VITAMIN B-12,) 1000 MCG/ML injection Inject 1,000 mcg into the muscle every 30 (thirty) days. Reported on 10/31/2015     No current facility-administered medications on file prior to visit.     Allergies  Allergen Reactions  . Atorvastatin Other (See Comments)    Myalgia      Family History  Problem Relation Age of Onset  . Cancer Mother        had uncertain type of cancer    BP (!) 146/84   Pulse 75   Ht '5\' 4"'$  (1.626 m)   Wt 151 lb (68.5 kg)   SpO2 97%   BMI 25.92 kg/m    Review of Systems She has lost 15 lbs.  Dental pain is resolved after extractions.  She has persistent right knee pain since a fall 6 weeks ago.  She tolerates lipitor well.     Objective:   Physical Exam VITAL SIGNS:  See vs page GENERAL: no distress.  In wheelchair.  Left facial strength is normal Speech is only slightly slurred.  Right knee: no tend/swell/warmth/erythema  Lab Results  Component Value Date   HGBA1C 7.2 (H) 02/16/2017     Lab Results  Component Value Date   CREATININE 0.94 03/22/2017   BUN 10 03/22/2017   NA 139 03/22/2017   K 4.2 03/22/2017   CL 103 03/22/2017   CO2 24 03/22/2017      Assessment & Plan:  CVA, new to me.  Improved Frail elderly state: rx of CVA should be risk factor control.  knee pain, persistent.   Type 2 DM: stable.   Patient Instructions  Here is a  prescription, for an anti-pain gel. No medication is needed for diabetes.   Please come back for a follow-up appointment in 3 months.

## 2017-04-09 DIAGNOSIS — I63411 Cerebral infarction due to embolism of right middle cerebral artery: Secondary | ICD-10-CM | POA: Diagnosis not present

## 2017-04-09 DIAGNOSIS — R2681 Unsteadiness on feet: Secondary | ICD-10-CM | POA: Diagnosis not present

## 2017-04-09 DIAGNOSIS — I69391 Dysphagia following cerebral infarction: Secondary | ICD-10-CM | POA: Diagnosis not present

## 2017-04-09 DIAGNOSIS — R262 Difficulty in walking, not elsewhere classified: Secondary | ICD-10-CM | POA: Diagnosis not present

## 2017-04-09 DIAGNOSIS — M6281 Muscle weakness (generalized): Secondary | ICD-10-CM | POA: Diagnosis not present

## 2017-04-10 DIAGNOSIS — R262 Difficulty in walking, not elsewhere classified: Secondary | ICD-10-CM | POA: Diagnosis not present

## 2017-04-10 DIAGNOSIS — M6281 Muscle weakness (generalized): Secondary | ICD-10-CM | POA: Diagnosis not present

## 2017-04-10 DIAGNOSIS — R2681 Unsteadiness on feet: Secondary | ICD-10-CM | POA: Diagnosis not present

## 2017-04-10 DIAGNOSIS — I63411 Cerebral infarction due to embolism of right middle cerebral artery: Secondary | ICD-10-CM | POA: Diagnosis not present

## 2017-04-10 DIAGNOSIS — I69391 Dysphagia following cerebral infarction: Secondary | ICD-10-CM | POA: Diagnosis not present

## 2017-04-12 DIAGNOSIS — I69391 Dysphagia following cerebral infarction: Secondary | ICD-10-CM | POA: Diagnosis not present

## 2017-04-12 DIAGNOSIS — M6281 Muscle weakness (generalized): Secondary | ICD-10-CM | POA: Diagnosis not present

## 2017-04-12 DIAGNOSIS — I63411 Cerebral infarction due to embolism of right middle cerebral artery: Secondary | ICD-10-CM | POA: Diagnosis not present

## 2017-04-12 DIAGNOSIS — R262 Difficulty in walking, not elsewhere classified: Secondary | ICD-10-CM | POA: Diagnosis not present

## 2017-04-12 DIAGNOSIS — R2681 Unsteadiness on feet: Secondary | ICD-10-CM | POA: Diagnosis not present

## 2017-04-16 DIAGNOSIS — I63411 Cerebral infarction due to embolism of right middle cerebral artery: Secondary | ICD-10-CM | POA: Diagnosis not present

## 2017-04-16 DIAGNOSIS — I69391 Dysphagia following cerebral infarction: Secondary | ICD-10-CM | POA: Diagnosis not present

## 2017-04-16 DIAGNOSIS — R2681 Unsteadiness on feet: Secondary | ICD-10-CM | POA: Diagnosis not present

## 2017-04-16 DIAGNOSIS — M6281 Muscle weakness (generalized): Secondary | ICD-10-CM | POA: Diagnosis not present

## 2017-04-16 DIAGNOSIS — R262 Difficulty in walking, not elsewhere classified: Secondary | ICD-10-CM | POA: Diagnosis not present

## 2017-04-17 DIAGNOSIS — R2681 Unsteadiness on feet: Secondary | ICD-10-CM | POA: Diagnosis not present

## 2017-04-17 DIAGNOSIS — R262 Difficulty in walking, not elsewhere classified: Secondary | ICD-10-CM | POA: Diagnosis not present

## 2017-04-17 DIAGNOSIS — I63411 Cerebral infarction due to embolism of right middle cerebral artery: Secondary | ICD-10-CM | POA: Diagnosis not present

## 2017-04-17 DIAGNOSIS — I69391 Dysphagia following cerebral infarction: Secondary | ICD-10-CM | POA: Diagnosis not present

## 2017-04-17 DIAGNOSIS — M6281 Muscle weakness (generalized): Secondary | ICD-10-CM | POA: Diagnosis not present

## 2017-04-18 DIAGNOSIS — R2681 Unsteadiness on feet: Secondary | ICD-10-CM | POA: Diagnosis not present

## 2017-04-19 DIAGNOSIS — I69391 Dysphagia following cerebral infarction: Secondary | ICD-10-CM | POA: Diagnosis not present

## 2017-04-19 DIAGNOSIS — R2681 Unsteadiness on feet: Secondary | ICD-10-CM | POA: Diagnosis not present

## 2017-04-19 DIAGNOSIS — M6281 Muscle weakness (generalized): Secondary | ICD-10-CM | POA: Diagnosis not present

## 2017-04-19 DIAGNOSIS — I63411 Cerebral infarction due to embolism of right middle cerebral artery: Secondary | ICD-10-CM | POA: Diagnosis not present

## 2017-04-19 DIAGNOSIS — R262 Difficulty in walking, not elsewhere classified: Secondary | ICD-10-CM | POA: Diagnosis not present

## 2017-04-22 ENCOUNTER — Ambulatory Visit: Payer: Self-pay | Admitting: Neurology

## 2017-04-22 DIAGNOSIS — R2681 Unsteadiness on feet: Secondary | ICD-10-CM | POA: Diagnosis not present

## 2017-04-22 DIAGNOSIS — I63411 Cerebral infarction due to embolism of right middle cerebral artery: Secondary | ICD-10-CM | POA: Diagnosis not present

## 2017-04-22 DIAGNOSIS — M6281 Muscle weakness (generalized): Secondary | ICD-10-CM | POA: Diagnosis not present

## 2017-04-22 DIAGNOSIS — R262 Difficulty in walking, not elsewhere classified: Secondary | ICD-10-CM | POA: Diagnosis not present

## 2017-04-22 DIAGNOSIS — I69391 Dysphagia following cerebral infarction: Secondary | ICD-10-CM | POA: Diagnosis not present

## 2017-04-23 ENCOUNTER — Encounter: Payer: Self-pay | Admitting: Neurology

## 2017-04-23 DIAGNOSIS — R2681 Unsteadiness on feet: Secondary | ICD-10-CM | POA: Diagnosis not present

## 2017-04-23 DIAGNOSIS — M6281 Muscle weakness (generalized): Secondary | ICD-10-CM | POA: Diagnosis not present

## 2017-04-23 DIAGNOSIS — I69391 Dysphagia following cerebral infarction: Secondary | ICD-10-CM | POA: Diagnosis not present

## 2017-04-23 DIAGNOSIS — R262 Difficulty in walking, not elsewhere classified: Secondary | ICD-10-CM | POA: Diagnosis not present

## 2017-04-23 DIAGNOSIS — I63411 Cerebral infarction due to embolism of right middle cerebral artery: Secondary | ICD-10-CM | POA: Diagnosis not present

## 2017-04-24 DIAGNOSIS — R262 Difficulty in walking, not elsewhere classified: Secondary | ICD-10-CM | POA: Diagnosis not present

## 2017-04-24 DIAGNOSIS — R2681 Unsteadiness on feet: Secondary | ICD-10-CM | POA: Diagnosis not present

## 2017-04-24 DIAGNOSIS — M6281 Muscle weakness (generalized): Secondary | ICD-10-CM | POA: Diagnosis not present

## 2017-04-24 DIAGNOSIS — I63411 Cerebral infarction due to embolism of right middle cerebral artery: Secondary | ICD-10-CM | POA: Diagnosis not present

## 2017-04-24 DIAGNOSIS — I69391 Dysphagia following cerebral infarction: Secondary | ICD-10-CM | POA: Diagnosis not present

## 2017-04-26 ENCOUNTER — Telehealth: Payer: Self-pay

## 2017-04-26 NOTE — Telephone Encounter (Signed)
I notified Shelby Jennings he could come pick up paper work for disability placard.

## 2017-05-01 DIAGNOSIS — R262 Difficulty in walking, not elsewhere classified: Secondary | ICD-10-CM | POA: Diagnosis not present

## 2017-05-01 DIAGNOSIS — I63411 Cerebral infarction due to embolism of right middle cerebral artery: Secondary | ICD-10-CM | POA: Diagnosis not present

## 2017-05-01 DIAGNOSIS — I69391 Dysphagia following cerebral infarction: Secondary | ICD-10-CM | POA: Diagnosis not present

## 2017-05-01 DIAGNOSIS — R2681 Unsteadiness on feet: Secondary | ICD-10-CM | POA: Diagnosis not present

## 2017-05-01 DIAGNOSIS — M6281 Muscle weakness (generalized): Secondary | ICD-10-CM | POA: Diagnosis not present

## 2017-05-18 DIAGNOSIS — R2681 Unsteadiness on feet: Secondary | ICD-10-CM | POA: Diagnosis not present

## 2017-06-10 DIAGNOSIS — R262 Difficulty in walking, not elsewhere classified: Secondary | ICD-10-CM | POA: Diagnosis not present

## 2017-06-10 DIAGNOSIS — Z9181 History of falling: Secondary | ICD-10-CM | POA: Diagnosis not present

## 2017-06-10 DIAGNOSIS — M6281 Muscle weakness (generalized): Secondary | ICD-10-CM | POA: Diagnosis not present

## 2017-06-11 DIAGNOSIS — R262 Difficulty in walking, not elsewhere classified: Secondary | ICD-10-CM | POA: Diagnosis not present

## 2017-06-11 DIAGNOSIS — Z9181 History of falling: Secondary | ICD-10-CM | POA: Diagnosis not present

## 2017-06-11 DIAGNOSIS — M6281 Muscle weakness (generalized): Secondary | ICD-10-CM | POA: Diagnosis not present

## 2017-06-11 DIAGNOSIS — R488 Other symbolic dysfunctions: Secondary | ICD-10-CM | POA: Diagnosis not present

## 2017-06-18 DIAGNOSIS — R2681 Unsteadiness on feet: Secondary | ICD-10-CM | POA: Diagnosis not present

## 2017-07-08 ENCOUNTER — Ambulatory Visit: Payer: Medicare Other | Admitting: Endocrinology

## 2017-07-09 ENCOUNTER — Other Ambulatory Visit (INDEPENDENT_AMBULATORY_CARE_PROVIDER_SITE_OTHER): Payer: Medicare Other

## 2017-07-09 ENCOUNTER — Encounter: Payer: Self-pay | Admitting: Endocrinology

## 2017-07-09 ENCOUNTER — Ambulatory Visit (INDEPENDENT_AMBULATORY_CARE_PROVIDER_SITE_OTHER): Payer: Medicare Other | Admitting: Endocrinology

## 2017-07-09 VITALS — BP 134/84 | HR 80 | Wt 154.4 lb

## 2017-07-09 DIAGNOSIS — E119 Type 2 diabetes mellitus without complications: Secondary | ICD-10-CM | POA: Diagnosis not present

## 2017-07-09 DIAGNOSIS — R413 Other amnesia: Secondary | ICD-10-CM

## 2017-07-09 DIAGNOSIS — E538 Deficiency of other specified B group vitamins: Secondary | ICD-10-CM

## 2017-07-09 HISTORY — DX: Deficiency of other specified B group vitamins: E53.8

## 2017-07-09 LAB — VITAMIN B12: VITAMIN B 12: 138 pg/mL — AB (ref 211–911)

## 2017-07-09 LAB — TSH: TSH: 1.48 u[IU]/mL (ref 0.35–4.50)

## 2017-07-09 LAB — POCT GLYCOSYLATED HEMOGLOBIN (HGB A1C): Hemoglobin A1C: 7.1

## 2017-07-09 MED ORDER — DONEPEZIL HCL 5 MG PO TABS: 5.0000 mg | ORAL_TABLET | Freq: Every day | ORAL | 11 refills | Status: DC

## 2017-07-09 NOTE — Patient Instructions (Addendum)
No medication is needed for diabetes.   blood tests are requested for you today.  We'll let you know about the results.  I have sent a prescription to your pharmacy, for the memory.   Please come back for a follow-up appointment in 2 months.

## 2017-07-09 NOTE — Progress Notes (Signed)
Subjective:    Patient ID: Shelby Jennings, female    DOB: 30-Nov-1923, 83 y.o.   MRN: [id number]  HPI Pt returns for f/u of diabetes mellitus: DM type: 2 Dx'ed: 5631 Complications: none Therapy: no medication now.  GDM: never DKA: never Severe hypoglycemia: never.  Pancreatitis: never.  Other info: she does not check cbg's.  Interval history: denies weight change.   Pt is here with nephew, who says pt is confused at night.  She has not recently taken B-12 shot.   Past Medical History:  Diagnosis Date  . DIABETES MELLITUS, TYPE II 06/24/2009  . HYPERLIPIDEMIA 05/09/2007  . HYPERTENSION 05/09/2007  . Morbid obesity ([place])   . OSTEOARTHRITIS 05/09/2007  . Stroke [facility])     Past Surgical History:  Procedure Laterality Date  . IR PERCUTANEOUS ART THROMBECTOMY/INFUSION INTRACRANIAL INC DIAG ANGIO  02/15/2017  . IR US GUIDE VASC ACCESS RIGHT  02/15/2017  . RADIOLOGY WITH ANESTHESIA N/A 02/15/2017   Procedure: CODE STROKE;  Surgeon: Radiologist, Medication, MD;  Location: [place];  Service: Radiology;  Laterality: N/A;    Social History   Socioeconomic History  . Marital status: Married    Spouse name: Not on file  . Number of children: Not on file  . Years of education: Not on file  . Highest education level: Not on file  Social Needs  . Financial resource strain: Not on file  . Food insecurity - worry: Not on file  . Food insecurity - inability: Not on file  . Transportation needs - medical: Not on file  . Transportation needs - non-medical: Not on file  Occupational History  . Occupation: Retired  Tobacco Use  . Smoking status: Never Smoker  . Smokeless tobacco: Never Used  Substance and Sexual Activity  . Alcohol use: No  . Drug use: No  . Sexual activity: Not on file  Other Topics Concern  . Not on file  Social History Narrative   Widowed    Current Outpatient Medications on File Prior to Visit  Medication Sig Dispense Refill  . Acetaminophen (TYLENOL 8 HOUR ARTHRITIS  PAIN PO) Take by mouth as needed.    Marland Kitchen aspirin 325 MG tablet Take 1 tablet (325 mg total) by mouth daily. 30 tablet 0  . cyanocobalamin (,VITAMIN B-12,) 1000 MCG/ML injection Inject 1,000 mcg into the muscle every 30 (thirty) days. Reported on 10/31/2015     No current facility-administered medications on file prior to visit.     Allergies  Allergen Reactions  . Atorvastatin Other (See Comments)    Myalgia     Family History  Problem Relation Age of Onset  . Cancer Mother        had uncertain type of cancer    BP 134/84 (BP Location: Right Arm, Patient Position: Sitting, Cuff Size: Normal)   Pulse 80   Wt 154 lb 6.4 oz (70 kg)   SpO2 95%   BMI 26.50 kg/m   Review of Systems Denies sob.      Objective:   Physical Exam VITAL SIGNS:  See vs page GENERAL: no distress Pulses: dorsalis pedis intact bilat.   MSK: no deformity of the feet, except for hammer toes and overlapping toes on both feet.  CV: no leg edema.  Skin:  no ulcer on the feet.  normal color and temp on the feet. Neuro: sensation is intact to touch on the feet.   Orientation to time 5 From broadest to most narrow: 1 Orientation to place  5 From broadest to most narrow: 4 Registration 3 Repeating named prompts: 3 Attention and calculation 5 spelling "world" backwards: 5 Recall 3 Registration recall: 0 Language 2 Naming a pencil and a watch: 2 Repetition 1 Speaking back a phrase: 1  Complex commands 6 draw figure. 6 Total=22  Lab Results  Component Value Date   CREATININE 0.94 03/22/2017   BUN 10 03/22/2017   NA 139 03/22/2017   K 4.2 03/22/2017   CL 103 03/22/2017   CO2 24 03/22/2017      Assessment & Plan:  Type 2 DM: stable Memory loss, new B-12 deficiency: therapy limited by noncompliance.    Patient Instructions  No medication is needed for diabetes.   blood tests are requested for you today.  We'll let you know about the results.  I have sent a prescription to your pharmacy, for the  memory.   Please come back for a follow-up appointment in 2 months.

## 2017-07-18 DIAGNOSIS — R2681 Unsteadiness on feet: Secondary | ICD-10-CM | POA: Diagnosis not present

## 2017-08-18 DIAGNOSIS — R2681 Unsteadiness on feet: Secondary | ICD-10-CM | POA: Diagnosis not present

## 2017-09-10 ENCOUNTER — Encounter: Payer: Self-pay | Admitting: Endocrinology

## 2017-09-10 ENCOUNTER — Ambulatory Visit (INDEPENDENT_AMBULATORY_CARE_PROVIDER_SITE_OTHER): Payer: Medicare Other | Admitting: Endocrinology

## 2017-09-10 VITALS — BP 138/80 | HR 78 | Wt 155.0 lb

## 2017-09-10 DIAGNOSIS — R413 Other amnesia: Secondary | ICD-10-CM

## 2017-09-10 DIAGNOSIS — N951 Menopausal and female climacteric states: Secondary | ICD-10-CM

## 2017-09-10 DIAGNOSIS — Z23 Encounter for immunization: Secondary | ICD-10-CM | POA: Diagnosis not present

## 2017-09-10 DIAGNOSIS — Z0000 Encounter for general adult medical examination without abnormal findings: Secondary | ICD-10-CM | POA: Diagnosis not present

## 2017-09-10 DIAGNOSIS — E119 Type 2 diabetes mellitus without complications: Secondary | ICD-10-CM

## 2017-09-10 DIAGNOSIS — E538 Deficiency of other specified B group vitamins: Secondary | ICD-10-CM | POA: Diagnosis not present

## 2017-09-10 LAB — POCT GLYCOSYLATED HEMOGLOBIN (HGB A1C): HEMOGLOBIN A1C: 7

## 2017-09-10 MED ORDER — CYANOCOBALAMIN 1000 MCG/ML IJ SOLN
1000.0000 ug | Freq: Once | INTRAMUSCULAR | Status: AC
  Administered 2017-09-10: 1000 ug via INTRAMUSCULAR

## 2017-09-10 MED ORDER — DONEPEZIL HCL 10 MG PO TABS: 10.0000 mg | ORAL_TABLET | Freq: Every day | ORAL | 3 refills | Status: DC

## 2017-09-10 NOTE — Progress Notes (Signed)
Subjective:    Patient ID: Shelby Jennings, female    DOB: 05/24/1924, 84 y.o.   MRN: [id number]  HPI Pt returns for f/u of diabetes mellitus:  DM type: 2 Dx'ed: 4742 Complications: none Therapy: no medication now.  GDM: never DKA: never Severe hypoglycemia: never.  Pancreatitis: never.  Other info: she does not check cbg's.  Interval history: denies weight change.   Pt is here with nephew, who says memory loss is slightly improved Past Medical History:  Diagnosis Date  . DIABETES MELLITUS, TYPE II 06/24/2009  . HYPERLIPIDEMIA 05/09/2007  . HYPERTENSION 05/09/2007  . Morbid obesity ([place])   . OSTEOARTHRITIS 05/09/2007  . Stroke [facility])     Past Surgical History:  Procedure Laterality Date  . IR PERCUTANEOUS ART THROMBECTOMY/INFUSION INTRACRANIAL INC DIAG ANGIO  02/15/2017  . IR US GUIDE VASC ACCESS RIGHT  02/15/2017  . RADIOLOGY WITH ANESTHESIA N/A 02/15/2017   Procedure: CODE STROKE;  Surgeon: Radiologist, Medication, MD;  Location: [place];  Service: Radiology;  Laterality: N/A;    Social History   Socioeconomic History  . Marital status: Married    Spouse name: Not on file  . Number of children: Not on file  . Years of education: Not on file  . Highest education level: Not on file  Social Needs  . Financial resource strain: Not on file  . Food insecurity - worry: Not on file  . Food insecurity - inability: Not on file  . Transportation needs - medical: Not on file  . Transportation needs - non-medical: Not on file  Occupational History  . Occupation: Retired  Tobacco Use  . Smoking status: Never Smoker  . Smokeless tobacco: Never Used  Substance and Sexual Activity  . Alcohol use: No  . Drug use: No  . Sexual activity: Not on file  Other Topics Concern  . Not on file  Social History Narrative   Widowed    Current Outpatient Medications on File Prior to Visit  Medication Sig Dispense Refill  . Acetaminophen (TYLENOL 8 HOUR ARTHRITIS PAIN PO) Take by mouth as  needed.    Marland Kitchen aspirin 325 MG tablet Take 1 tablet (325 mg total) by mouth daily. 30 tablet 0  . cyanocobalamin (,VITAMIN B-12,) 1000 MCG/ML injection Inject 1,000 mcg into the muscle every 30 (thirty) days. Reported on 10/31/2015     No current facility-administered medications on file prior to visit.     Allergies  Allergen Reactions  . Atorvastatin Other (See Comments)    Myalgia     Family History  Problem Relation Age of Onset  . Cancer Mother        had uncertain type of cancer    BP 138/80 (BP Location: Left Arm, Patient Position: Sitting, Cuff Size: Normal)   Pulse 78   Wt 155 lb (70.3 kg)   SpO2 94%   BMI 26.61 kg/m    Review of Systems Denies headache.     Objective:   Physical Exam VITAL SIGNS:  See vs page GENERAL: no distress Pulses: dorsalis pedis intact bilat.   MSK: no deformity of the feet, except for hammer toes and overlapping toes on both feet.  CV: no leg edema.  Skin:  no ulcer on the feet.  normal color and temp on the feet. Neuro: sensation is intact to touch on the feet.    Lab Results  Component Value Date   HGBA1C 7.0 09/10/2017       Assessment & Plan:  Type 2  DM: no medication is needed for the diabetes now.  Memory loss, slightly improved.  I have sent a prescription to your pharmacy, to double the pill for the memory.      Subjective:   Patient here for Medicare annual wellness visit and management of other chronic and acute problems.     Risk factors: advanced age    22 of Physicians Providing Medical Care to Patient:  See "snapshot"   Activities of Daily Living: In your present state of health, do you have any difficulty performing the following activities (lives independent living--she is checked on qd)?:  Preparing food and eating?: No  Bathing yourself: No  Getting dressed: No  Using the toilet:No  Moving around from place to place: No  In the past year have you fallen or had a near fall?: No    Home Safety: Has  smoke detector and wears seat belts. No firearms.    Opioid Use: none   Diet and Exercise  Current exercise habits: limited by health problems Dietary issues discussed: pt reports a healthy diet   Depression Screen  Q1: Over the past two weeks, have you felt down, depressed or hopeless? no  Q2: Over the past two weeks, have you felt little interest or pleasure in doing things? no   The following portions of the patient's history were reviewed and updated as appropriate: allergies, current medications, past family history, past medical history, past social history, past surgical history and problem list.   Review of Systems  Denies hearing loss, and visual loss Objective:   Vision:  Ref to opthalmologist, so he declines [facility] today Hearing: grossly normal Body mass index:  See vs page Msk: pt slowly performs "get-up-and-go" from a sitting position.   Cognitive Impairment Assessment: cognition, memory and judgment appear normal.  remembers 0/3 at 5 minutes.  good recall.  can easily read and write a sentence.  alert and oriented to self and place only   Assessment:   Medicare wellness utd on preventive parameters   Plan:   During the course of the visit the patient was educated and counseled about appropriate screening and preventive services including:        Fall prevention is advised today  Screening mammography is not indicated Bone densitometry screening is ordered today Diabetes screening is done today Nutrition counseling is offered  advanced directives/end of life addressed today:  see healthcare directives hyperlink  Vaccines are updated as needed  Patient Instructions (the written plan) was given to the patient.

## 2017-09-10 NOTE — Progress Notes (Signed)
we discussed code status.  pt requests DNR 

## 2017-09-10 NOTE — Patient Instructions (Addendum)
No medication is needed for the diabetes now.  I have sent a prescription to your pharmacy, to double the pill for the memory.   A urine requested for you today.  We'll let you know about the results. good diet and exercise significantly improve the control of your diabetes.  please let me know if you wish to be referred to a dietician.  high blood sugar is very risky to your health.  you should see an eye doctor and dentist every year.  It is very important to get all recommended vaccinations.  Please consider these measures for your health:  minimize alcohol.  Do not use tobacco products.  Have a colonoscopy at least every 10 years from age 80.  Women should have an annual mammogram from age 75.  Keep firearms safely stored.  Always use seat belts.  have working smoke alarms in your home.  See an eye doctor and dentist regularly.  Never drive under the influence of alcohol or drugs (including prescription drugs).  Those with fair skin should take precautions against the sun, and should carefully examine their skin once per month, for any new or changed moles. It is critically important to prevent falling down (keep floor areas well-lit, dry, and free of loose objects.  If you have a cane, walker, or wheelchair, you should use it, even for short trips around the house.  Wear flat-soled shoes.  Also, try not to rush) Please come back for a follow-up appointment in 2 months.

## 2017-09-18 DIAGNOSIS — R2681 Unsteadiness on feet: Secondary | ICD-10-CM | POA: Diagnosis not present

## 2017-10-16 DIAGNOSIS — R2681 Unsteadiness on feet: Secondary | ICD-10-CM | POA: Diagnosis not present

## 2017-11-06 ENCOUNTER — Encounter: Payer: Self-pay | Admitting: Endocrinology

## 2017-11-06 ENCOUNTER — Ambulatory Visit (INDEPENDENT_AMBULATORY_CARE_PROVIDER_SITE_OTHER): Payer: Medicare Other | Admitting: Endocrinology

## 2017-11-06 ENCOUNTER — Ambulatory Visit: Payer: Medicare Other

## 2017-11-06 VITALS — BP 104/66 | HR 62 | Wt 156.0 lb

## 2017-11-06 DIAGNOSIS — E119 Type 2 diabetes mellitus without complications: Secondary | ICD-10-CM

## 2017-11-06 LAB — POCT GLYCOSYLATED HEMOGLOBIN (HGB A1C): Hemoglobin A1C: 6.6

## 2017-11-06 MED ORDER — MEMANTINE HCL 5 MG PO TABS: 5.0000 mg | ORAL_TABLET | Freq: Two times a day (BID) | ORAL | 11 refills | Status: DC

## 2017-11-06 MED ORDER — DONEPEZIL HCL 5 MG PO TABS: 5.0000 mg | ORAL_TABLET | Freq: Every day | ORAL | 11 refills | Status: DC

## 2017-11-06 NOTE — Patient Instructions (Addendum)
No medication is needed for the diabetes now.  I have sent a prescription to your pharmacy, to reduce the donepezil, and to add another medication.   Please come back for a follow-up appointment in 2 months.

## 2017-11-06 NOTE — Progress Notes (Signed)
Subjective:    Patient ID: Shelby Jennings, female    DOB: Feb 15, 1924, 84 y.o.   MRN: [id number]  HPI Pt returns for f/u of diabetes mellitus:  DM type: 2 Dx'ed: 6789 Complications: none Therapy: no medication now.  GDM: never DKA: never Severe hypoglycemia: never.  Pancreatitis: never.  Other info: she does not check cbg's.  Interval history: denies weight change.   Pt is here with nephew, who says memory loss is not improved.  He says pt needs help with ADL's.  No behavioral problems.   Past Medical History:  Diagnosis Date  . DIABETES MELLITUS, TYPE II 06/24/2009  . HYPERLIPIDEMIA 05/09/2007  . HYPERTENSION 05/09/2007  . Morbid obesity ([place])   . OSTEOARTHRITIS 05/09/2007  . Stroke [facility])     Past Surgical History:  Procedure Laterality Date  . IR PERCUTANEOUS ART THROMBECTOMY/INFUSION INTRACRANIAL INC DIAG ANGIO  02/15/2017  . IR US GUIDE VASC ACCESS RIGHT  02/15/2017  . RADIOLOGY WITH ANESTHESIA N/A 02/15/2017   Procedure: CODE STROKE;  Surgeon: Radiologist, Medication, MD;  Location: [place];  Service: Radiology;  Laterality: N/A;    Social History   Socioeconomic History  . Marital status: Married    Spouse name: Not on file  . Number of children: Not on file  . Years of education: Not on file  . Highest education level: Not on file  Occupational History  . Occupation: Retired  Scientific laboratory technician  . Financial resource strain: Not on file  . Food insecurity:    Worry: Not on file    Inability: Not on file  . Transportation needs:    Medical: Not on file    Non-medical: Not on file  Tobacco Use  . Smoking status: Never Smoker  . Smokeless tobacco: Never Used  Substance and Sexual Activity  . Alcohol use: No  . Drug use: No  . Sexual activity: Not on file  Lifestyle  . Physical activity:    Days per week: Not on file    Minutes per session: Not on file  . Stress: Not on file  Relationships  . Social connections:    Talks on phone: Not on file    Gets together: Not  on file    Attends religious service: Not on file    Active member of club or organization: Not on file    Attends meetings of clubs or organizations: Not on file    Relationship status: Not on file  . Intimate partner violence:    Fear of current or ex partner: Not on file    Emotionally abused: Not on file    Physically abused: Not on file    Forced sexual activity: Not on file  Other Topics Concern  . Not on file  Social History Narrative   Widowed    Current Outpatient Medications on File Prior to Visit  Medication Sig Dispense Refill  . Acetaminophen (TYLENOL 8 HOUR ARTHRITIS PAIN PO) Take by mouth as needed.    Marland Kitchen aspirin 325 MG tablet Take 1 tablet (325 mg total) by mouth daily. 30 tablet 0  . cyanocobalamin (,VITAMIN B-12,) 1000 MCG/ML injection Inject 1,000 mcg into the muscle every 30 (thirty) days. Reported on 10/31/2015     No current facility-administered medications on file prior to visit.     Allergies  Allergen Reactions  . Atorvastatin Other (See Comments)    Myalgia     Family History  Problem Relation Age of Onset  . Cancer Mother  had uncertain type of cancer    BP 104/66 (BP Location: Left Wrist, Patient Position: Sitting, Cuff Size: Normal)   Pulse 62   Wt 156 lb (70.8 kg)   SpO2 95%   BMI 26.78 kg/m    Review of Systems Denies falls. Son feels increasing aricept causes generalized weakness.    Objective:   Physical Exam VITAL SIGNS:  See vs page GENERAL: no distress PSYCH: Alert and oriented to self and place only.  Does not appear anxious nor depressed.     Lab Results  Component Value Date   HGBA1C 6.6 11/06/2017       Assessment & Plan:  Type 2 DM: well-controlled off medication.  Dementia: she needs increased rx.  Patient Instructions  No medication is needed for the diabetes now.  I have sent a prescription to your pharmacy, to reduce the donepezil, and to add another medication.   Please come back for a follow-up  appointment in 2 months.

## 2017-11-16 DIAGNOSIS — R2681 Unsteadiness on feet: Secondary | ICD-10-CM | POA: Diagnosis not present

## 2017-12-02 ENCOUNTER — Other Ambulatory Visit: Payer: Self-pay | Admitting: *Deleted

## 2017-12-02 ENCOUNTER — Telehealth: Payer: Self-pay

## 2017-12-02 MED ORDER — MEMANTINE HCL 5 MG PO TABS: 5.0000 mg | ORAL_TABLET | Freq: Two times a day (BID) | ORAL | 3 refills | Status: DC

## 2017-12-02 NOTE — Telephone Encounter (Signed)
I called and spoke with Shelby Jennings & made patient a f/u appt. So that paperwork can be filled out for long term care facility. I have kept paperwork in my file until patient comes in to office.

## 2017-12-04 ENCOUNTER — Telehealth: Payer: Self-pay | Admitting: Endocrinology

## 2017-12-04 NOTE — Telephone Encounter (Signed)
Patient niece Juliene Pina is asking if the other two doctors here can sign the patient [place] 2 form, can't wait until he come back monday Please advise

## 2017-12-04 NOTE — Telephone Encounter (Signed)
I called back & spoke with patient's nephew, [name] & was able to move appt up to 5/1 instead.

## 2017-12-11 ENCOUNTER — Encounter: Payer: Self-pay | Admitting: Endocrinology

## 2017-12-11 ENCOUNTER — Ambulatory Visit (INDEPENDENT_AMBULATORY_CARE_PROVIDER_SITE_OTHER): Payer: Medicare Other | Admitting: Endocrinology

## 2017-12-11 VITALS — BP 150/80 | HR 71 | Wt 151.2 lb

## 2017-12-11 DIAGNOSIS — R413 Other amnesia: Secondary | ICD-10-CM | POA: Diagnosis not present

## 2017-12-11 MED ORDER — DONEPEZIL HCL 10 MG PO TABS: 10.0000 mg | ORAL_TABLET | Freq: Every day | ORAL | 3 refills | Status: DC

## 2017-12-11 NOTE — Progress Notes (Signed)
Subjective:    Patient ID: Shelby Jennings, female    DOB: 05-01-1924, 84 y.o.   MRN: [id number]  HPI Pt is here with nephew [name], who lives in [place]).  He says memory loss is the same.  He says pt needs help with ADL's.  No behavioral problems. He wants to move pt closer to him.   Past Medical History:  Diagnosis Date  . DIABETES MELLITUS, TYPE II 06/24/2009  . HYPERLIPIDEMIA 05/09/2007  . HYPERTENSION 05/09/2007  . Morbid obesity ([place])   . OSTEOARTHRITIS 05/09/2007  . Stroke [facility])     Past Surgical History:  Procedure Laterality Date  . IR PERCUTANEOUS ART THROMBECTOMY/INFUSION INTRACRANIAL INC DIAG ANGIO  02/15/2017  . IR US GUIDE VASC ACCESS RIGHT  02/15/2017  . RADIOLOGY WITH ANESTHESIA N/A 02/15/2017   Procedure: CODE STROKE;  Surgeon: Radiologist, Medication, MD;  Location: [place];  Service: Radiology;  Laterality: N/A;    Social History   Socioeconomic History  . Marital status: Married    Spouse name: Not on file  . Number of children: Not on file  . Years of education: Not on file  . Highest education level: Not on file  Occupational History  . Occupation: Retired  Scientific laboratory technician  . Financial resource strain: Not on file  . Food insecurity:    Worry: Not on file    Inability: Not on file  . Transportation needs:    Medical: Not on file    Non-medical: Not on file  Tobacco Use  . Smoking status: Never Smoker  . Smokeless tobacco: Never Used  Substance and Sexual Activity  . Alcohol use: No  . Drug use: No  . Sexual activity: Not on file  Lifestyle  . Physical activity:    Days per week: Not on file    Minutes per session: Not on file  . Stress: Not on file  Relationships  . Social connections:    Talks on phone: Not on file    Gets together: Not on file    Attends religious service: Not on file    Active member of club or organization: Not on file    Attends meetings of clubs or organizations: Not on file    Relationship status: Not on file  .  Intimate partner violence:    Fear of current or ex partner: Not on file    Emotionally abused: Not on file    Physically abused: Not on file    Forced sexual activity: Not on file  Other Topics Concern  . Not on file  Social History Narrative   Widowed    Current Outpatient Medications on File Prior to Visit  Medication Sig Dispense Refill  . Acetaminophen (TYLENOL 8 HOUR ARTHRITIS PAIN PO) Take by mouth as needed.    Marland Kitchen aspirin 325 MG tablet Take 1 tablet (325 mg total) by mouth daily. 30 tablet 0  . cyanocobalamin (,VITAMIN B-12,) 1000 MCG/ML injection Inject 1,000 mcg into the muscle every 30 (thirty) days. Reported on 10/31/2015    . memantine (NAMENDA) 5 MG tablet Take 1 tablet (5 mg total) by mouth 2 (two) times daily. 180 tablet 3   No current facility-administered medications on file prior to visit.     Allergies  Allergen Reactions  . Atorvastatin Other (See Comments)    Myalgia     Family History  Problem Relation Age of Onset  . Cancer Mother        had uncertain type of  cancer    BP (!) 150/80   Pulse 71   Wt 151 lb 3.2 oz (68.6 kg)   SpO2 97%   BMI 25.95 kg/m    Review of Systems Denies falls.      Objective:   Physical Exam VITAL SIGNS:  See vs page GENERAL: no distress.  In wheelchair.   PSYCH: Alert and oriented to self and place only.  Does not appear anxious nor depressed.       Assessment & Plan:  Memory loss, not improved HTN: with poss situational component: same rx for now  Patient Instructions  I have sent a prescription to your pharmacy, to increase the donepezil.  Please continue the same other medications.   Please come back for a follow-up appointment in 3 months if you still live here.  If not, best wishes on your move to [place].

## 2017-12-11 NOTE — Patient Instructions (Addendum)
I have sent a prescription to your pharmacy, to increase the donepezil.  Please continue the same other medications.   Please come back for a follow-up appointment in 3 months if you still live here.  If not, best wishes on your move to [place].

## 2017-12-16 DIAGNOSIS — R2681 Unsteadiness on feet: Secondary | ICD-10-CM | POA: Diagnosis not present

## 2017-12-18 ENCOUNTER — Ambulatory Visit: Payer: Medicare Other | Admitting: Endocrinology

## 2018-01-04 ENCOUNTER — Observation Stay (HOSPITAL_COMMUNITY)
Admission: EM | Admit: 2018-01-04 | Discharge: 2018-01-10 | Disposition: A | Discharge status: Skilled Nursing Facility | Payer: Medicare Other | Attending: Internal Medicine | Admitting: Internal Medicine

## 2018-01-04 ENCOUNTER — Emergency Department (HOSPITAL_COMMUNITY): Payer: Medicare Other

## 2018-01-04 ENCOUNTER — Encounter (HOSPITAL_COMMUNITY): Payer: Self-pay | Admitting: *Deleted

## 2018-01-04 DIAGNOSIS — M6282 Rhabdomyolysis: Secondary | ICD-10-CM

## 2018-01-04 DIAGNOSIS — S32592A Other specified fracture of left pubis, initial encounter for closed fracture: Secondary | ICD-10-CM | POA: Diagnosis not present

## 2018-01-04 DIAGNOSIS — Z888 Allergy status to other drugs, medicaments and biological substances status: Secondary | ICD-10-CM | POA: Diagnosis not present

## 2018-01-04 DIAGNOSIS — Z8673 Personal history of transient ischemic attack (TIA), and cerebral infarction without residual deficits: Secondary | ICD-10-CM | POA: Diagnosis not present

## 2018-01-04 DIAGNOSIS — Z79899 Other long term (current) drug therapy: Secondary | ICD-10-CM | POA: Diagnosis not present

## 2018-01-04 DIAGNOSIS — I480 Paroxysmal atrial fibrillation: Secondary | ICD-10-CM | POA: Diagnosis present

## 2018-01-04 DIAGNOSIS — Z7982 Long term (current) use of aspirin: Secondary | ICD-10-CM | POA: Diagnosis not present

## 2018-01-04 DIAGNOSIS — E785 Hyperlipidemia, unspecified: Secondary | ICD-10-CM | POA: Diagnosis not present

## 2018-01-04 DIAGNOSIS — S3993XA Unspecified injury of pelvis, initial encounter: Secondary | ICD-10-CM | POA: Diagnosis not present

## 2018-01-04 DIAGNOSIS — I491 Atrial premature depolarization: Secondary | ICD-10-CM | POA: Insufficient documentation

## 2018-01-04 DIAGNOSIS — M1990 Unspecified osteoarthritis, unspecified site: Secondary | ICD-10-CM | POA: Diagnosis not present

## 2018-01-04 DIAGNOSIS — I5032 Chronic diastolic (congestive) heart failure: Secondary | ICD-10-CM | POA: Diagnosis not present

## 2018-01-04 DIAGNOSIS — R55 Syncope and collapse: Secondary | ICD-10-CM | POA: Diagnosis not present

## 2018-01-04 DIAGNOSIS — S199XXA Unspecified injury of neck, initial encounter: Secondary | ICD-10-CM | POA: Diagnosis not present

## 2018-01-04 DIAGNOSIS — I10 Essential (primary) hypertension: Secondary | ICD-10-CM | POA: Diagnosis not present

## 2018-01-04 DIAGNOSIS — S3992XA Unspecified injury of lower back, initial encounter: Secondary | ICD-10-CM | POA: Diagnosis not present

## 2018-01-04 DIAGNOSIS — I110 Hypertensive heart disease with heart failure: Secondary | ICD-10-CM | POA: Diagnosis not present

## 2018-01-04 DIAGNOSIS — E119 Type 2 diabetes mellitus without complications: Secondary | ICD-10-CM | POA: Diagnosis not present

## 2018-01-04 DIAGNOSIS — R778 Other specified abnormalities of plasma proteins: Secondary | ICD-10-CM

## 2018-01-04 DIAGNOSIS — S32502A Unspecified fracture of left pubis, initial encounter for closed fracture: Secondary | ICD-10-CM | POA: Diagnosis not present

## 2018-01-04 DIAGNOSIS — R1312 Dysphagia, oropharyngeal phase: Secondary | ICD-10-CM | POA: Insufficient documentation

## 2018-01-04 DIAGNOSIS — E079 Disorder of thyroid, unspecified: Secondary | ICD-10-CM | POA: Diagnosis present

## 2018-01-04 DIAGNOSIS — S3289XA Fracture of other parts of pelvis, initial encounter for closed fracture: Secondary | ICD-10-CM

## 2018-01-04 DIAGNOSIS — S299XXA Unspecified injury of thorax, initial encounter: Secondary | ICD-10-CM | POA: Diagnosis not present

## 2018-01-04 DIAGNOSIS — F0390 Unspecified dementia without behavioral disturbance: Secondary | ICD-10-CM | POA: Insufficient documentation

## 2018-01-04 DIAGNOSIS — W19XXXA Unspecified fall, initial encounter: Secondary | ICD-10-CM | POA: Insufficient documentation

## 2018-01-04 DIAGNOSIS — I639 Cerebral infarction, unspecified: Secondary | ICD-10-CM | POA: Diagnosis present

## 2018-01-04 DIAGNOSIS — R000 Tachycardia, unspecified: Secondary | ICD-10-CM | POA: Diagnosis not present

## 2018-01-04 DIAGNOSIS — T148XXA Other injury of unspecified body region, initial encounter: Secondary | ICD-10-CM | POA: Diagnosis not present

## 2018-01-04 DIAGNOSIS — M546 Pain in thoracic spine: Secondary | ICD-10-CM | POA: Diagnosis not present

## 2018-01-04 DIAGNOSIS — R7989 Other specified abnormal findings of blood chemistry: Secondary | ICD-10-CM | POA: Diagnosis not present

## 2018-01-04 DIAGNOSIS — S0990XA Unspecified injury of head, initial encounter: Secondary | ICD-10-CM | POA: Diagnosis not present

## 2018-01-04 DIAGNOSIS — I700 Atherosclerosis of aorta: Secondary | ICD-10-CM | POA: Diagnosis not present

## 2018-01-04 HISTORY — DX: Rhabdomyolysis: M62.82

## 2018-01-04 LAB — COMPREHENSIVE METABOLIC PANEL
ALBUMIN: 3.3 g/dL — AB (ref 3.5–5.0)
ALK PHOS: 91 U/L (ref 38–126)
ALT: 19 U/L (ref 14–54)
AST: 31 U/L (ref 15–41)
Anion gap: 10 (ref 5–15)
BUN: 11 mg/dL (ref 6–20)
CHLORIDE: 107 mmol/L (ref 101–111)
CO2: 23 mmol/L (ref 22–32)
Calcium: 9.1 mg/dL (ref 8.9–10.3)
Creatinine, Ser: 0.82 mg/dL (ref 0.44–1.00)
GFR calc Af Amer: >60 mL/min (ref >60)
GFR calc non Af Amer: >60 mL/min (ref >60)
GLUCOSE: 153 mg/dL — AB (ref 65–99)
POTASSIUM: 3.7 mmol/L (ref 3.5–5.1)
SODIUM: 140 mmol/L (ref 135–145)
Total Bilirubin: 1.1 mg/dL (ref 0.3–1.2)
Total Protein: 7.9 g/dL (ref 6.5–8.1)

## 2018-01-04 LAB — CBC WITH DIFFERENTIAL/PLATELET
ABS IMMATURE GRANULOCYTES: 0 10*3/uL (ref 0.0–0.1)
Basophils Absolute: 0 10*3/uL (ref 0.0–0.1)
Basophils Relative: 0 %
Eosinophils Absolute: 0 10*3/uL (ref 0.0–0.7)
Eosinophils Relative: 0 %
HEMATOCRIT: 44.5 % (ref 36.0–46.0)
HEMOGLOBIN: 14.1 g/dL (ref 12.0–15.0)
Immature Granulocytes: 0 %
LYMPHS PCT: 18 %
Lymphs Abs: 1.7 10*3/uL (ref 0.7–4.0)
MCH: 28.7 pg (ref 26.0–34.0)
MCHC: 31.7 g/dL (ref 30.0–36.0)
MCV: 90.4 fL (ref 78.0–100.0)
MONO ABS: 0.9 10*3/uL (ref 0.1–1.0)
MONOS PCT: 10 %
NEUTROS ABS: 6.4 10*3/uL (ref 1.7–7.7)
Neutrophils Relative %: 72 %
PLATELETS: 268 10*3/uL (ref 150–400)
RBC: 4.92 MIL/uL (ref 3.87–5.11)
RDW: 15.2 % (ref 11.5–15.5)
WBC: 9.1 10*3/uL (ref 4.0–10.5)

## 2018-01-04 LAB — URINALYSIS, ROUTINE W REFLEX MICROSCOPIC
BILIRUBIN URINE: NEGATIVE
Bacteria, UA: NONE SEEN
Bacteria, UA: NONE SEEN
Bilirubin Urine: NEGATIVE
GLUCOSE, UA: NEGATIVE mg/dL
Glucose, UA: NEGATIVE mg/dL
Ketones, ur: 5 mg/dL — AB
Ketones, ur: 5 mg/dL — AB
LEUKOCYTES UA: NEGATIVE
Leukocytes, UA: NEGATIVE
NITRITE: NEGATIVE
Nitrite: NEGATIVE
PROTEIN: NEGATIVE mg/dL
Protein, ur: NEGATIVE mg/dL
Specific Gravity, Urine: 1.019 (ref 1.005–1.030)
Specific Gravity, Urine: 1.018 (ref 1.005–1.030)
pH: 5 (ref 5.0–8.0)
pH: 5 (ref 5.0–8.0)

## 2018-01-04 LAB — I-STAT CHEM 8, ED
BUN: 12 mg/dL (ref 6–20)
CALCIUM ION: 1.14 mmol/L — AB (ref 1.15–1.40)
CHLORIDE: 108 mmol/L (ref 101–111)
Creatinine, Ser: 0.6 mg/dL (ref 0.44–1.00)
Glucose, Bld: 150 mg/dL — ABNORMAL HIGH (ref 65–99)
HCT: 45 % (ref 36.0–46.0)
HEMOGLOBIN: 15.3 g/dL — AB (ref 12.0–15.0)
Potassium: 3.7 mmol/L (ref 3.5–5.1)
SODIUM: 143 mmol/L (ref 135–145)
TCO2: 23 mmol/L (ref 22–32)

## 2018-01-04 LAB — TROPONIN I
Troponin I: 0.14 ng/mL (ref <0.03)
Troponin I: 0.14 ng/mL (ref <0.03)

## 2018-01-04 LAB — MRSA PCR SCREENING: MRSA by PCR: NEGATIVE

## 2018-01-04 LAB — CBC
HEMATOCRIT: 41.8 % (ref 36.0–46.0)
HEMOGLOBIN: 13.3 g/dL (ref 12.0–15.0)
MCH: 28.4 pg (ref 26.0–34.0)
MCHC: 31.8 g/dL (ref 30.0–36.0)
MCV: 89.3 fL (ref 78.0–100.0)
Platelets: 316 10*3/uL (ref 150–400)
RBC: 4.68 MIL/uL (ref 3.87–5.11)
RDW: 15 % (ref 11.5–15.5)
WBC: 10 10*3/uL (ref 4.0–10.5)

## 2018-01-04 LAB — CK: Total CK: 882 U/L — ABNORMAL HIGH (ref 38–234)

## 2018-01-04 LAB — I-STAT TROPONIN, ED: TROPONIN I, POC: 0.14 ng/mL — AB (ref 0.00–0.08)

## 2018-01-04 LAB — CREATININE, SERUM
Creatinine, Ser: 0.75 mg/dL (ref 0.44–1.00)
GFR calc non Af Amer: >60 mL/min (ref >60)

## 2018-01-04 MED ORDER — SODIUM CHLORIDE 0.9 % IV BOLUS
500.0000 mL | Freq: Once | INTRAVENOUS | Status: AC
  Administered 2018-01-04: 500 mL via INTRAVENOUS

## 2018-01-04 MED ORDER — HYDRALAZINE HCL 20 MG/ML IJ SOLN
10.0000 mg | Freq: Once | INTRAMUSCULAR | Status: AC
  Administered 2018-01-04: 10 mg via INTRAVENOUS

## 2018-01-04 MED ORDER — ACETAMINOPHEN 325 MG PO TABS: 650.0000 mg | ORAL_TABLET | Freq: Once | ORAL | Status: DC

## 2018-01-04 MED ORDER — DOCUSATE SODIUM 100 MG PO CAPS
100.0000 mg | ORAL_CAPSULE | Freq: Two times a day (BID) | ORAL | Status: DC
  Administered 2018-01-04 – 2018-01-10 (×11): 100 mg via ORAL
  Filled 2018-01-04 (×12): qty 1

## 2018-01-04 MED ORDER — ASPIRIN 325 MG PO TABS
325.0000 mg | ORAL_TABLET | Freq: Every day | ORAL | Status: DC
  Administered 2018-01-04 – 2018-01-10 (×7): 325 mg via ORAL
  Filled 2018-01-04 (×7): qty 1

## 2018-01-04 MED ORDER — POTASSIUM CHLORIDE IN NACL 20-0.9 MEQ/L-% IV SOLN
INTRAVENOUS | Status: AC
  Administered 2018-01-04: 15:00:00 via INTRAVENOUS
  Filled 2018-01-04: qty 1000

## 2018-01-04 MED ORDER — DONEPEZIL HCL 10 MG PO TABS
10.0000 mg | ORAL_TABLET | Freq: Every day | ORAL | Status: DC
  Administered 2018-01-04 – 2018-01-09 (×6): 10 mg via ORAL
  Filled 2018-01-04 (×6): qty 1

## 2018-01-04 MED ORDER — ONDANSETRON HCL 4 MG PO TABS: 4.0000 mg | ORAL_TABLET | Freq: Four times a day (QID) | ORAL | Status: DC | PRN

## 2018-01-04 MED ORDER — TRAMADOL HCL 50 MG PO TABS
100.0000 mg | ORAL_TABLET | Freq: Four times a day (QID) | ORAL | Status: DC | PRN
  Administered 2018-01-06 – 2018-01-08 (×4): 100 mg via ORAL
  Filled 2018-01-04 (×5): qty 2

## 2018-01-04 MED ORDER — LORAZEPAM 2 MG/ML IJ SOLN
1.0000 mg | Freq: Once | INTRAMUSCULAR | Status: AC
  Administered 2018-01-04: 1 mg via INTRAVENOUS
  Filled 2018-01-04: qty 1

## 2018-01-04 MED ORDER — IOPAMIDOL (ISOVUE-370) INJECTION 76%
INTRAVENOUS | Status: AC
  Administered 2018-01-04: 100 mL
  Filled 2018-01-04: qty 100

## 2018-01-04 MED ORDER — ENOXAPARIN SODIUM 40 MG/0.4ML ~~LOC~~ SOLN
40.0000 mg | SUBCUTANEOUS | Status: DC
  Administered 2018-01-04 – 2018-01-09 (×6): 40 mg via SUBCUTANEOUS
  Filled 2018-01-04 (×6): qty 0.4

## 2018-01-04 MED ORDER — MEMANTINE HCL 5 MG PO TABS
5.0000 mg | ORAL_TABLET | Freq: Two times a day (BID) | ORAL | Status: DC
  Administered 2018-01-04 – 2018-01-10 (×13): 5 mg via ORAL
  Filled 2018-01-04 (×13): qty 1

## 2018-01-04 MED ORDER — ACETAMINOPHEN 325 MG PO TABS
650.0000 mg | ORAL_TABLET | Freq: Four times a day (QID) | ORAL | Status: DC
  Administered 2018-01-04 – 2018-01-10 (×17): 650 mg via ORAL
  Filled 2018-01-04 (×18): qty 2

## 2018-01-04 MED ORDER — SODIUM CHLORIDE 0.9% FLUSH
3.0000 mL | Freq: Two times a day (BID) | INTRAVENOUS | Status: DC
  Administered 2018-01-04 – 2018-01-09 (×3): 3 mL via INTRAVENOUS

## 2018-01-04 MED ORDER — HYDRALAZINE HCL 20 MG/ML IJ SOLN
5.0000 mg | Freq: Once | INTRAMUSCULAR | Status: DC
  Filled 2018-01-04: qty 1

## 2018-01-04 MED ORDER — ONDANSETRON HCL 4 MG/2ML IJ SOLN: 4.0000 mg | Freq: Four times a day (QID) | INTRAMUSCULAR | Status: DC | PRN

## 2018-01-04 MED ORDER — IBUPROFEN 200 MG PO TABS: 600.0000 mg | ORAL_TABLET | Freq: Four times a day (QID) | ORAL | Status: DC | PRN

## 2018-01-04 NOTE — Clinical Social Work Note (Addendum)
CSW received a call from EDP Dr. Evangeline Gula stating pt has Dementia and is confused at this time. EDP attempted to reach a nephew of patient-Shelby Jennings, but he stated he is just a friend. EDP requesting CSW assistance in reaching next of kin for patient decision-making. CSW called patient's nephew-Dempsey Sabra Heck [phone]) listed in the chart. Mr. Sabra Heck confirmed he is patient's nephew and POA and is available for decision making. CSW to update EDP. CSW signing off. Please consult if new Social Work needs arise.   Oretha Ellis, [place], [place] [employer] [phone]

## 2018-01-04 NOTE — H&P (Addendum)
History and Physical    %%%%% I received a call from Education officer, museum.  Patient's power of attorney is [name] [phone]%%%%% I spoke with Mr. Sabra Heck regarding his aunt's care.  He is interested in her transferring to a skilled facility in the [place] area where he lives.  Has begun investigating facilities even prior to her injury.  I instructed him to consult social work.  Alitzel Cookson WUX:[id number] DOB: [birth year] DOA: 01/04/2018  PCP: Renato Shin, MD  Patient coming from: Home  I have personally briefly reviewed patient's old medical records in [place]  Chief Complaint: Found on floor at home this morning  HPI: Shelby Jennings is a 84 y.o. female with medical history significant of diabetes type 2, hyperlipidemia, hypertension, osteoarthritis, stroke, and dementia who presents the emergency department after being found this morning on the floor.  Per the staff at the independent living facility they believe patient was on the floor all night long.  She was last seen at 7 PM last night.  She complained of lower back pain with EMS but is denying any pain at admission.  Specifically she is denying any chest pain, arm pain, back pain, abdominal pain, leg pain.  She is very confused.  Evaluation in the ER revealed a minimally displaced left pubic ramus fracture.  Her troponin was elevated as well however her CPK is elevated.  EKG shows a sinus tachycardia and PACs but is not significantly changed when compared with lie 01/30/2017. ER physician has spoken with orthopedics who recommended weightbearing as tolerated and physical therapy.  Cardiology was phoned and discussed the case with the ER as well patient does not require any cardiological intervention.  Given her age, physical condition and dementia we will admit her into the hospital for overnight monitoring.  We will have physical therapy see her and plan discharge to skilled facility for ambulation we will also consider her  troponins as we cycle them and repeat an EKG in the morning.  I do not expect there will be making any significant interventions regarding her cardiac function especially since the patient denies any pain in her chest.  Review of Systems: As per HPI otherwise all other systems reviewed and  negative.    Past Medical History:  Diagnosis Date  . DIABETES MELLITUS, TYPE II 06/24/2009  . HYPERLIPIDEMIA 05/09/2007  . HYPERTENSION 05/09/2007  . Morbid obesity ([place])   . OSTEOARTHRITIS 05/09/2007  . Stroke [facility])     Past Surgical History:  Procedure Laterality Date  . IR PERCUTANEOUS ART THROMBECTOMY/INFUSION INTRACRANIAL INC DIAG ANGIO  02/15/2017  . IR US GUIDE VASC ACCESS RIGHT  02/15/2017  . RADIOLOGY WITH ANESTHESIA N/A 02/15/2017   Procedure: CODE STROKE;  Surgeon: Radiologist, Medication, MD;  Location: [place];  Service: Radiology;  Laterality: N/A;    Social History   Social History Narrative   Widowed     reports that she has never smoked. She has never used smokeless tobacco. She reports that she does not drink alcohol or use drugs.  Allergies  Allergen Reactions  . Atorvastatin Other (See Comments)    Myalgia     Family History  Problem Relation Age of Onset  . Cancer Mother        had uncertain type of cancer    Prior to Admission medications   Medication Sig Start Date End Date Taking? Authorizing Provider  Acetaminophen (TYLENOL 8 HOUR ARTHRITIS PAIN PO) Take by mouth as needed.   Yes [provider]  aspirin 325 MG tablet Take 1 tablet (325 mg total) by mouth daily. 02/21/17  Yes Patteson, Arlan Organ, NP  donepezil (ARICEPT) 10 MG tablet Take 1 tablet (10 mg total) by mouth at bedtime. 12/11/17  Yes Renato Shin, MD  memantine (NAMENDA) 5 MG tablet Take 1 tablet (5 mg total) by mouth 2 (two) times daily. 12/02/17  Yes Renato Shin, MD  cyanocobalamin (,VITAMIN B-12,) 1000 MCG/ML injection Inject 1,000 mcg into the muscle every 30 (thirty) days. Reported on  10/31/2015    [provider]    Physical Exam:  Constitutional: NAD, calm, comfortable Vitals:   01/04/18 1145 01/04/18 1230 01/04/18 1245 01/04/18 1315  BP: (!) 168/100 (!) 158/92 (!) 155/98 (!) 149/99  Pulse: (!) 116 (!) 111 (!) 103 (!) 101  Resp: (!) 27 (!) 26 (!) 24 (!) 30  Temp:      TempSrc:      SpO2: 99% 99% 98% 97%   Eyes: PERRL, lids and conjunctivae normal ENMT: Mucous membranes are moist. Posterior pharynx clear of any exudate or lesions.Normal dentition.  Neck: normal, supple, no masses, no thyromegaly Respiratory: clear to auscultation bilaterally, no wheezing, no crackles. Normal respiratory effort. No accessory muscle use.  Cardiovascular: Regular rate and rhythm, no murmurs / rubs / gallops. No extremity edema. 2+ pedal pulses. No carotid bruits.  Abdomen: no tenderness, no masses palpated. No hepatosplenomegaly. Bowel sounds positive.  Musculoskeletal: no clubbing / cyanosis. No joint deformity upper and lower extremities. Good ROM except for left lower extremity has pain with range of motion, no contractures. Normal muscle tone.  Skin: no rashes, lesions, ulcers. No induration Neurologic: CN 2-12 grossly intact. Sensation intact, DTR normal. Strength 5/5 in all 4.  Psychiatric: Confused.  Alert and oriented to name but not location day date or time    Labs on Admission: I have personally reviewed following labs and imaging studies  CBC: Recent Labs  Lab 01/04/18 1011 01/04/18 1211  WBC 9.1  --   NEUTROABS 6.4  --   HGB 14.1 15.3*  HCT 44.5 45.0  MCV 90.4  --   PLT 268  --    Basic Metabolic Panel: Recent Labs  Lab 01/04/18 1200 01/04/18 1211  NA 140 143  K 3.7 3.7  CL 107 108  CO2 23  --   GLUCOSE 153* 150*  BUN 11 12  CREATININE 0.82 0.60  CALCIUM 9.1  --     Liver Function Tests: Recent Labs  Lab 01/04/18 1200  AST 31  ALT 19  ALKPHOS 91  BILITOT 1.1  PROT 7.9  ALBUMIN 3.3*   Cardiac Enzymes: Recent Labs  Lab  01/04/18 1200  CKTOTAL 882*   Urine analysis:    Component Value Date/Time   COLORURINE YELLOW 01/04/2018 [place] 01/04/2018 1011   LABSPEC 1.019 01/04/2018 1011   PHURINE 5.0 01/04/2018 1011   GLUCOSEU NEGATIVE 01/04/2018 1011   GLUCOSEU NEGATIVE 05/20/2013 0952   HGBUR SMALL (A) 01/04/2018 1011   BILIRUBINUR NEGATIVE 01/04/2018 1011   KETONESUR 5 (A) 01/04/2018 1011   PROTEINUR NEGATIVE 01/04/2018 1011   UROBILINOGEN 1.0 05/20/2013 0952   NITRITE NEGATIVE 01/04/2018 [place] 01/04/2018 1011    Radiological Exams on Admission: Dg Chest 1 View  Result Date: 01/04/2018 CLINICAL DATA:  [place] EXAM: CHEST  1 VIEW COMPARISON:  02/16/2017 FINDINGS: No pneumothorax. No evident pleural effusion. Regional bones grossly unremarkable. Aortic Atherosclerosis (ICD10-170.0) Low lung volumes. No focal infiltrate.  Heart size upper limits normal for technique. IMPRESSION: 1. Low volumes.  No acute findings. Electronically Signed   By: Lucrezia Europe M.D.   On: 01/04/2018 11:00   Dg Lumbar Spine Complete  Result Date: 01/04/2018 CLINICAL DATA:  [place] EXAM: LUMBAR SPINE - COMPLETE 4+ VIEW COMPARISON:  None. FINDINGS: Negative for fracture. Vertebral body height well maintained throughout. Grade 1 anterolisthesis L4-5. No definite pars defect. Vertebral body and disc height maintained throughout. Patchy aortoiliac arterial calcifications without suggestion of aneurysm. IMPRESSION: 1. Negative for fracture or other acute bone abnormality. 2. Grade 1 anterolisthesis L4-5. 3.  Aortic Atherosclerosis (ICD10-170.0) Electronically Signed   By: Lucrezia Europe M.D.   On: 01/04/2018 11:04   Dg Pelvis 1-2 Views  Result Date: 01/04/2018 CLINICAL DATA:  [place] EXAM: PELVIS - 1-2 VIEW COMPARISON:  None. FINDINGS: Possible minimal displaced fracture of the superior left pubic ramus. No other pelvic ring fracture. Hips negative. Patchy iliofemoral arterial calcifications. IMPRESSION: 1.  Possibly minimally displaced fracture, superior left pubic ramus. Electronically Signed   By: Lucrezia Europe M.D.   On: 01/04/2018 11:02   Ct Head Wo Contrast  Result Date: 01/04/2018 CLINICAL DATA:  Trauma.  Found on floor by bed this morning. EXAM: CT HEAD WITHOUT CONTRAST CT CERVICAL SPINE WITHOUT CONTRAST TECHNIQUE: Multidetector CT imaging of the head and cervical spine was performed following the standard protocol without intravenous contrast. Multiplanar CT image reconstructions of the cervical spine were also generated. COMPARISON:  03/22/2017 FINDINGS: CT HEAD FINDINGS Brain: Again seen is a partially calcified left paraclinoid meningioma measuring 2.9 cm, image 12/4. Unchanged from previous exam. No evidence of acute infarction, hemorrhage, hydrocephalus or extra-axial collection. There is mild diffuse low-attenuation within the subcortical and periventricular white matter compatible with chronic microvascular disease. Vascular: No hyperdense vessel or unexpected calcification. Skull: Normal. Negative for fracture or focal lesion. Sinuses/Orbits: No acute finding. Other: None. CT CERVICAL SPINE FINDINGS Alignment: Normal. Skull base and vertebrae: No acute fracture. No primary bone lesion or focal pathologic process. Soft tissues and spinal canal: Bilateral facet hypertrophy and degenerative change noted. Disc levels: There is mild disc space narrowing and ventral endplate spurring identified at C5-6, C6-7 and C7-T1. Upper chest: Large nodule in left lobe of thyroid gland measures 2.7 cm, image 80/8. Other: None IMPRESSION: 1. No acute intracranial abnormalities. 2. Similar appearance of left paraclinoid meningioma. 3. The chronic small vessel ischemic disease. 4. No evidence for cervical spine fracture or dislocation. 5. Incidental note made of a large left lobe of thyroid gland nodule measuring 2.7 cm. Consider further evaluation with thyroid ultrasound. If patient is clinically hyperthyroid, consider  nuclear medicine thyroid uptake and scan. Electronically Signed   By: Kerby Moors M.D.   On: 01/04/2018 10:51   Ct Angio Chest Pe W And/or Wo Contrast  Result Date: 01/04/2018 CLINICAL DATA:  Unwitnessed fall.  Lower back pain. EXAM: CT ANGIOGRAPHY CHEST WITH CONTRAST TECHNIQUE: Multidetector CT imaging of the chest was performed using the standard protocol during bolus administration of intravenous contrast. Multiplanar CT image reconstructions and MIPs were obtained to evaluate the vascular anatomy. CONTRAST:  133mL ISOVUE-370 IOPAMIDOL (ISOVUE-370) INJECTION 76% COMPARISON:  None. FINDINGS: Cardiovascular: Satisfactory opacification of the pulmonary arteries to the segmental level. No evidence of pulmonary embolism. Normal heart size. No pericardial effusion. Atherosclerosis of thoracic aorta is noted without aneurysm formation. Mediastinum/Nodes: 2.9 cm left thyroid nodule is noted. The esophagus is unremarkable. No adenopathy is noted. Lungs/Pleura: Lungs are clear. No pleural effusion or pneumothorax.  Upper Abdomen: No acute abnormality. Musculoskeletal: No chest wall abnormality. No acute or significant osseous findings. Review of the MIP images confirms the above findings. IMPRESSION: No definite evidence of pulmonary embolus. 2.9 cm left thyroid nodule. Thyroid ultrasound is recommended for further evaluation. Aortic Atherosclerosis (ICD10-I70.0). Electronically Signed   By: Marijo Conception, M.D.   On: 01/04/2018 13:25   Ct Cervical Spine Wo Contrast  Result Date: 01/04/2018 CLINICAL DATA:  Trauma.  Found on floor by bed this morning. EXAM: CT HEAD WITHOUT CONTRAST CT CERVICAL SPINE WITHOUT CONTRAST TECHNIQUE: Multidetector CT imaging of the head and cervical spine was performed following the standard protocol without intravenous contrast. Multiplanar CT image reconstructions of the cervical spine were also generated. COMPARISON:  03/22/2017 FINDINGS: CT HEAD FINDINGS Brain: Again seen is a  partially calcified left paraclinoid meningioma measuring 2.9 cm, image 12/4. Unchanged from previous exam. No evidence of acute infarction, hemorrhage, hydrocephalus or extra-axial collection. There is mild diffuse low-attenuation within the subcortical and periventricular white matter compatible with chronic microvascular disease. Vascular: No hyperdense vessel or unexpected calcification. Skull: Normal. Negative for fracture or focal lesion. Sinuses/Orbits: No acute finding. Other: None. CT CERVICAL SPINE FINDINGS Alignment: Normal. Skull base and vertebrae: No acute fracture. No primary bone lesion or focal pathologic process. Soft tissues and spinal canal: Bilateral facet hypertrophy and degenerative change noted. Disc levels: There is mild disc space narrowing and ventral endplate spurring identified at C5-6, C6-7 and C7-T1. Upper chest: Large nodule in left lobe of thyroid gland measures 2.7 cm, image 80/8. Other: None IMPRESSION: 1. No acute intracranial abnormalities. 2. Similar appearance of left paraclinoid meningioma. 3. The chronic small vessel ischemic disease. 4. No evidence for cervical spine fracture or dislocation. 5. Incidental note made of a large left lobe of thyroid gland nodule measuring 2.7 cm. Consider further evaluation with thyroid ultrasound. If patient is clinically hyperthyroid, consider nuclear medicine thyroid uptake and scan. Electronically Signed   By: Kerby Moors M.D.   On: 01/04/2018 10:51    EKG: Independently reviewed.  See HPI  Assessment/Plan Principal Problem:   Pubic ramus fracture, left, closed, initial encounter ([place]) Active Problems:   Elevated troponin   Diabetes ([place])   Essential hypertension   Osteoarthritis   Thyroid mass   Stroke ([place]) - Right middle cerebral artery due to right M1 occlusion status post recanalization with mechanical embolectomy- etiology likely embolic - probably due to atrial fibrillation   PAF (paroxysmal atrial fibrillation)  ([place])    1.  Left pubic ramus fracture: Patient will be placed in observation we will consult physical therapy will start her on acetaminophen 650 mg p.o. every 6 hours scheduled.  We will also have available ibuprofen for moderate pain and tramadol for severe pain.  No surgical intervention necessary.  Appreciate orthopedics input.  2.  Elevated troponin: Suspect related to elevated CPKs which at this point does not reach the level of rhabdomyolysis.  We will hydrate patient gently and monitor troponins.  3.  Diabetes type 2: Continue home medication management.  Check fingerstick blood glucoses twice daily.  Patient on no medications.  Will continue with a carbohydrate controlled diet.  4.  Essential hypertension: Patient on no home medications.  Will monitor blood pressures.  5.  Osteoarthritis Tylenol as scheduled.  6.  Thyroid mass: This is noted previously in her record.  In 2012 patient refused further work-up.  7.  History of right middle cerebral artery stroke: Noted continue daily aspirin.  8.  Paroxysmal atrial fibrillation: Patient clearly at risk for falls and is not on anticoagulation.  We will continue current management.  She prior appears to be rate controlled without medications.  DVT prophylaxis: Lovenox Code Status: Full code Family Communication: Attempted to call Lenna Sciara (see phone number in facesheet) based on her note from July 2018 where case management had spoken to him.  Our records indicate that he is the patient's nephew.  He states he is just a good friend.  There is phone numbers for a brother and a granddaughter.  I have asked case management to further evaluate.  Patient clearly needs a Marine scientist. Disposition Plan: Likely skilled nursing facility.  Patient lives in independent living. Consults called: Orthopedics and cardiology by phone from the ER MD Admission status: Observation   Lady Deutscher MD [facility] Pager [phone]  If 7PM-7AM, please contact night-coverage [email or web address] Password [facility]  01/04/2018, 2:38 PM

## 2018-01-04 NOTE — ED Notes (Signed)
Patient transported to CT 

## 2018-01-04 NOTE — ED Provider Notes (Signed)
[facility] EMERGENCY DEPARTMENT Provider Note   CSN: [id number] Arrival date & time: 01/04/18  2229     History   Chief Complaint Chief Complaint  Patient presents with  . Fall    HPI Shelby Jennings is a 84 y.o. female hx of DM, HTN, HL, previous stroke here with fall.  Patient is currently an independent living.  She apparently was found beside her bed this morning.  She did not remember how she fell.  She denies passing out or loss of consciousness.  She apparently was unable to get up from the floor since last night.  She denies any chest pain or shortness of breath.  She was initially complaining of some back pain to EMS but denies it to me.  Patient was noted to be hypertensive per EMS.   The history is provided by the patient and the EMS personnel.    Past Medical History:  Diagnosis Date  . DIABETES MELLITUS, TYPE II 06/24/2009  . HYPERLIPIDEMIA 05/09/2007  . HYPERTENSION 05/09/2007  . Morbid obesity ([place])   . OSTEOARTHRITIS 05/09/2007  . Stroke [facility])     Patient Active Problem List   Diagnosis Date Noted  . Menopausal state 09/10/2017  . Acute right MCA stroke ([place])   . Benign essential HTN   . Diabetes mellitus type 2 in nonobese (H[facility])   . Hyperlipidemia   . PAF (paroxysmal atrial fibrillation) ([place])   . Dysphagia, post-stroke   . Acute respiratory failure with hypoxia ([place])   . Stroke [facility]) - Right middle cerebral artery due to right M1 occlusion status post recanalization with mechanical embolectomy- etiology likely embolic - probably due to atrial fibrillation 02/15/2017  . Stroke with cerebral ischemia ([place]) 02/15/2017  . Memory deficit 06/11/2014  . Thyroid mass 03/23/2011  . Encounter for long-term (current) use of other medications 12/21/2010  . Routine general medical examination at a health care facility 12/21/2010  . KNEE PAIN, RIGHT 12/29/2009  . Diabetes ([place]) 06/24/2009  . PERS HX NONCOMPLIANCE W/MED TX PRS HAZARDS HLTH 06/24/2009    . UNSPECIFIED PRURITIC DISORDER 06/24/2007  . Dyslipidemia 05/09/2007  . Essential hypertension 05/09/2007  . DYSHIDROSIS 05/09/2007  . OSTEOARTHRITIS 05/09/2007    Past Surgical History:  Procedure Laterality Date  . IR PERCUTANEOUS ART THROMBECTOMY/INFUSION INTRACRANIAL INC DIAG ANGIO  02/15/2017  . IR US GUIDE VASC ACCESS RIGHT  02/15/2017  . RADIOLOGY WITH ANESTHESIA N/A 02/15/2017   Procedure: CODE STROKE;  Surgeon: Radiologist, Medication, MD;  Location: [facility];  Service: Radiology;  Laterality: N/A;     OB History   None      Home Medications    Prior to Admission medications   Medication Sig Start Date End Date Taking? Authorizing Provider  Acetaminophen (TYLENOL 8 HOUR ARTHRITIS PAIN PO) Take by mouth as needed.   Yes [provider]  aspirin 325 MG tablet Take 1 tablet (325 mg total) by mouth daily. 02/21/17  Yes Patteson, Arlan Organ, NP  donepezil (ARICEPT) 10 MG tablet Take 1 tablet (10 mg total) by mouth at bedtime. 12/11/17  Yes Renato Shin, MD  memantine (NAMENDA) 5 MG tablet Take 1 tablet (5 mg total) by mouth 2 (two) times daily. 12/02/17  Yes Renato Shin, MD  cyanocobalamin (,VITAMIN B-12,) 1000 MCG/ML injection Inject 1,000 mcg into the muscle every 30 (thirty) days. Reported on 10/31/2015    [provider]    Family History Family History  Problem Relation Age of Onset  . Cancer Mother  had uncertain type of cancer    Social History Social History   Tobacco Use  . Smoking status: Never Smoker  . Smokeless tobacco: Never Used  Substance Use Topics  . Alcohol use: No  . Drug use: No     Allergies   Atorvastatin   Review of Systems Review of Systems  Musculoskeletal: Positive for back pain.  All other systems reviewed and are negative.    Physical Exam Updated Vital Signs BP (!) 149/99   Pulse (!) 101   Temp 98.3 F (36.8 C) (Oral)   Resp (!) 30   SpO2 97%   Physical Exam  Constitutional: She is oriented to  person, place, and time.  Chronically ill   HENT:  Head: Normocephalic and atraumatic.  Mouth/Throat: Oropharynx is clear and moist.  Eyes: Pupils are equal, round, and reactive to light. EOM are normal.  Neck:  C collar in place   Cardiovascular: Normal rate, regular rhythm and normal heart sounds.  Pulmonary/Chest: Effort normal and breath sounds normal. No stridor. No respiratory distress. She has no wheezes.  Abdominal: Soft. Bowel sounds are normal. She exhibits no distension. There is no tenderness.  Musculoskeletal:  Mild lower lumbar tenderness, no obvious deformity. Mild L hip tenderness but no obvious deformity. No obvious extremity trauma   Neurological: She is alert and oriented to person, place, and time.  CN 2-12 intact. Moving all extremities.   Skin: Skin is warm.  Psychiatric: She has a normal mood and affect.  Nursing note and vitals reviewed.    ED Treatments / Results  Labs (all labs ordered are listed, but only abnormal results are displayed) Labs Reviewed  URINALYSIS, ROUTINE W REFLEX MICROSCOPIC - Abnormal; Notable for the following components:      Result Value   Hgb urine dipstick SMALL (*)    Ketones, ur 5 (*)    All other components within normal limits  COMPREHENSIVE METABOLIC PANEL - Abnormal; Notable for the following components:   Glucose, Bld 153 (*)    Albumin 3.3 (*)    All other components within normal limits  CK - Abnormal; Notable for the following components:   Total CK 882 (*)    All other components within normal limits  I-STAT TROPONIN, ED - Abnormal; Notable for the following components:   Troponin i, poc 0.14 (*)    All other components within normal limits  I-STAT CHEM 8, ED - Abnormal; Notable for the following components:   Glucose, Bld 150 (*)    Calcium, Ion 1.14 (*)    Hemoglobin 15.3 (*)    All other components within normal limits  CBC WITH DIFFERENTIAL/PLATELET  COMPREHENSIVE METABOLIC PANEL  CK    EKG EKG  Interpretation  Date/Time:  Saturday Jan 04 2018 09:41:29 EDT Ventricular Rate:  106 PR Interval:    QRS Duration: 89 QT Interval:  365 QTC Calculation: 485 R Axis:   -28 Text Interpretation:  Sinus tachycardia Atrial premature complex Borderline left axis deviation Probable anteroseptal infarct, old Minimal ST depression, lateral leads No significant change since last tracing Confirmed by Wandra Arthurs [phone]) on 01/04/2018 9:47:11 AM Also confirmed by Wandra Arthurs [phone]), editor Laurena Spies [phone])  on 01/04/2018 11:14:03 AM   Radiology Dg Chest 1 View  Result Date: 01/04/2018 CLINICAL DATA:  [place] EXAM: CHEST  1 VIEW COMPARISON:  02/16/2017 FINDINGS: No pneumothorax. No evident pleural effusion. Regional bones grossly unremarkable. Aortic Atherosclerosis (ICD10-170.0) Low lung volumes. No focal infiltrate. Heart size  upper limits normal for technique. IMPRESSION: 1. Low volumes.  No acute findings. Electronically Signed   By: Lucrezia Europe M.D.   On: 01/04/2018 11:00   Dg Lumbar Spine Complete  Result Date: 01/04/2018 CLINICAL DATA:  [place] EXAM: LUMBAR SPINE - COMPLETE 4+ VIEW COMPARISON:  None. FINDINGS: Negative for fracture. Vertebral body height well maintained throughout. Grade 1 anterolisthesis L4-5. No definite pars defect. Vertebral body and disc height maintained throughout. Patchy aortoiliac arterial calcifications without suggestion of aneurysm. IMPRESSION: 1. Negative for fracture or other acute bone abnormality. 2. Grade 1 anterolisthesis L4-5. 3.  Aortic Atherosclerosis (ICD10-170.0) Electronically Signed   By: Lucrezia Europe M.D.   On: 01/04/2018 11:04   Dg Pelvis 1-2 Views  Result Date: 01/04/2018 CLINICAL DATA:  [place] EXAM: PELVIS - 1-2 VIEW COMPARISON:  None. FINDINGS: Possible minimal displaced fracture of the superior left pubic ramus. No other pelvic ring fracture. Hips negative. Patchy iliofemoral arterial calcifications. IMPRESSION: 1. Possibly minimally displaced fracture,  superior left pubic ramus. Electronically Signed   By: Lucrezia Europe M.D.   On: 01/04/2018 11:02   Ct Head Wo Contrast  Result Date: 01/04/2018 CLINICAL DATA:  Trauma.  Found on floor by bed this morning. EXAM: CT HEAD WITHOUT CONTRAST CT CERVICAL SPINE WITHOUT CONTRAST TECHNIQUE: Multidetector CT imaging of the head and cervical spine was performed following the standard protocol without intravenous contrast. Multiplanar CT image reconstructions of the cervical spine were also generated. COMPARISON:  03/22/2017 FINDINGS: CT HEAD FINDINGS Brain: Again seen is a partially calcified left paraclinoid meningioma measuring 2.9 cm, image 12/4. Unchanged from previous exam. No evidence of acute infarction, hemorrhage, hydrocephalus or extra-axial collection. There is mild diffuse low-attenuation within the subcortical and periventricular white matter compatible with chronic microvascular disease. Vascular: No hyperdense vessel or unexpected calcification. Skull: Normal. Negative for fracture or focal lesion. Sinuses/Orbits: No acute finding. Other: None. CT CERVICAL SPINE FINDINGS Alignment: Normal. Skull base and vertebrae: No acute fracture. No primary bone lesion or focal pathologic process. Soft tissues and spinal canal: Bilateral facet hypertrophy and degenerative change noted. Disc levels: There is mild disc space narrowing and ventral endplate spurring identified at C5-6, C6-7 and C7-T1. Upper chest: Large nodule in left lobe of thyroid gland measures 2.7 cm, image 80/8. Other: None IMPRESSION: 1. No acute intracranial abnormalities. 2. Similar appearance of left paraclinoid meningioma. 3. The chronic small vessel ischemic disease. 4. No evidence for cervical spine fracture or dislocation. 5. Incidental note made of a large left lobe of thyroid gland nodule measuring 2.7 cm. Consider further evaluation with thyroid ultrasound. If patient is clinically hyperthyroid, consider nuclear medicine thyroid uptake and scan.  Electronically Signed   By: Kerby Moors M.D.   On: 01/04/2018 10:51   Ct Angio Chest Pe W And/or Wo Contrast  Result Date: 01/04/2018 CLINICAL DATA:  Unwitnessed fall.  Lower back pain. EXAM: CT ANGIOGRAPHY CHEST WITH CONTRAST TECHNIQUE: Multidetector CT imaging of the chest was performed using the standard protocol during bolus administration of intravenous contrast. Multiplanar CT image reconstructions and MIPs were obtained to evaluate the vascular anatomy. CONTRAST:  178mL ISOVUE-370 IOPAMIDOL (ISOVUE-370) INJECTION 76% COMPARISON:  None. FINDINGS: Cardiovascular: Satisfactory opacification of the pulmonary arteries to the segmental level. No evidence of pulmonary embolism. Normal heart size. No pericardial effusion. Atherosclerosis of thoracic aorta is noted without aneurysm formation. Mediastinum/Nodes: 2.9 cm left thyroid nodule is noted. The esophagus is unremarkable. No adenopathy is noted. Lungs/Pleura: Lungs are clear. No pleural effusion or pneumothorax. Upper Abdomen:  No acute abnormality. Musculoskeletal: No chest wall abnormality. No acute or significant osseous findings. Review of the MIP images confirms the above findings. IMPRESSION: No definite evidence of pulmonary embolus. 2.9 cm left thyroid nodule. Thyroid ultrasound is recommended for further evaluation. Aortic Atherosclerosis (ICD10-I70.0). Electronically Signed   By: Marijo Conception, M.D.   On: 01/04/2018 13:25   Ct Cervical Spine Wo Contrast  Result Date: 01/04/2018 CLINICAL DATA:  Trauma.  Found on floor by bed this morning. EXAM: CT HEAD WITHOUT CONTRAST CT CERVICAL SPINE WITHOUT CONTRAST TECHNIQUE: Multidetector CT imaging of the head and cervical spine was performed following the standard protocol without intravenous contrast. Multiplanar CT image reconstructions of the cervical spine were also generated. COMPARISON:  03/22/2017 FINDINGS: CT HEAD FINDINGS Brain: Again seen is a partially calcified left paraclinoid meningioma  measuring 2.9 cm, image 12/4. Unchanged from previous exam. No evidence of acute infarction, hemorrhage, hydrocephalus or extra-axial collection. There is mild diffuse low-attenuation within the subcortical and periventricular white matter compatible with chronic microvascular disease. Vascular: No hyperdense vessel or unexpected calcification. Skull: Normal. Negative for fracture or focal lesion. Sinuses/Orbits: No acute finding. Other: None. CT CERVICAL SPINE FINDINGS Alignment: Normal. Skull base and vertebrae: No acute fracture. No primary bone lesion or focal pathologic process. Soft tissues and spinal canal: Bilateral facet hypertrophy and degenerative change noted. Disc levels: There is mild disc space narrowing and ventral endplate spurring identified at C5-6, C6-7 and C7-T1. Upper chest: Large nodule in left lobe of thyroid gland measures 2.7 cm, image 80/8. Other: None IMPRESSION: 1. No acute intracranial abnormalities. 2. Similar appearance of left paraclinoid meningioma. 3. The chronic small vessel ischemic disease. 4. No evidence for cervical spine fracture or dislocation. 5. Incidental note made of a large left lobe of thyroid gland nodule measuring 2.7 cm. Consider further evaluation with thyroid ultrasound. If patient is clinically hyperthyroid, consider nuclear medicine thyroid uptake and scan. Electronically Signed   By: Kerby Moors M.D.   On: 01/04/2018 10:51    Procedures Procedures (including critical care time)  CRITICAL CARE Performed by: Wandra Arthurs   Total critical care time: 30 minutes  Critical care time was exclusive of separately billable procedures and treating other patients.  Critical care was necessary to treat or prevent imminent or life-threatening deterioration.  Critical care was time spent personally by me on the following activities: development of treatment plan with patient and/or surrogate as well as nursing, discussions with consultants, evaluation of  patient's response to treatment, examination of patient, obtaining history from patient or surrogate, ordering and performing treatments and interventions, ordering and review of laboratory studies, ordering and review of radiographic studies, pulse oximetry and re-evaluation of patient's condition.   Medications Ordered in ED Medications  acetaminophen (TYLENOL) tablet 650 mg (650 mg Oral Not Given 01/04/18 1105)  sodium chloride 0.9 % bolus 500 mL (500 mLs Intravenous New Bag/Given 01/04/18 1315)  hydrALAZINE (APRESOLINE) injection 10 mg (10 mg Intravenous Given 01/04/18 1111)  LORazepam (ATIVAN) injection 1 mg (1 mg Intravenous Given 01/04/18 1218)  iopamidol (ISOVUE-370) 76 % injection (100 mLs  Contrast Given 01/04/18 1254)     Initial Impression / Assessment and Plan / ED Course  I have reviewed the triage vital signs and the nursing notes.  Pertinent labs & imaging results that were available during my care of the patient were reviewed by me and considered in my medical decision making (see chart for details).    Shelby Jennings is a 84 y.o.  female here with unwitnessed fall. She is hypertensive as well. Consider subdural hemorrhage. Also consider rhabdomyolysis since she was on the floor since last night. Will get labs, UA, CK, CT head/neck, xrays. Will get orthostatics as well.    1:32 PM  CK 882. Trop 0.14. Xray pelvis showed displaced L pubic ramus fracture. She remained tachycardic. CT head showed chronic meningioma. CTA chest showed no PE. I talked to Dr. Stann Mainland from ortho, who recommend weight bear as tolerated, physical therapy. I also called Cardiologist on call, who states that since patient is demented and is not a cath candidate that she needs to be admitted to medicine service and trend trop, hold heparin for now. If medicine service needs formal consult, they can consult cardiology. I don't know if she had demand ischemia or had NSTEMI or unstable angina causing her fall. She has  mild rhabdo as well. Hospitalist to admit.    Final Clinical Impressions(s) / ED Diagnoses   Final diagnoses:  Fall    ED Discharge Orders    None       Drenda Freeze, MD 01/04/18 [phone]

## 2018-01-04 NOTE — ED Triage Notes (Addendum)
Pt arrives via [facility] from [facility], pt was found on the floor by her bed this am, pt was last seen at 7pm last night. Hx of dementia, pt alert and oriented to person only, c collar in place, c/o lower back pain with ems, denies pain at this time.

## 2018-01-04 NOTE — Progress Notes (Signed)
Pt arrived to the unit via stretcher from the ED. Alert to self, situation but no the year. Pt states she does not remember falling but she knows she is in the hospital because she fell. Pt oriented to the room and staff. Bed alarm on, call light and phone within reach.

## 2018-01-04 NOTE — Progress Notes (Signed)
CRITICAL VALUE STICKER  CRITICAL VALUE: troponin 0.14  RECEIVER (on-site recipient of call): Emilio Math, RN   DATE & TIME NOTIFIED: 01/04/18 1615  MESSENGER (representative from lab):  MD NOTIFIED: Evangeline Gula, MD   TIME OF NOTIFICATION: 3734  RESPONSE: awaiting orders

## 2018-01-04 NOTE — ED Notes (Signed)
Pt yelling, requesting to leave, requesting monitor to be removed

## 2018-01-05 DIAGNOSIS — I10 Essential (primary) hypertension: Secondary | ICD-10-CM | POA: Diagnosis not present

## 2018-01-05 DIAGNOSIS — R748 Abnormal levels of other serum enzymes: Secondary | ICD-10-CM | POA: Diagnosis not present

## 2018-01-05 DIAGNOSIS — S32592A Other specified fracture of left pubis, initial encounter for closed fracture: Secondary | ICD-10-CM | POA: Diagnosis not present

## 2018-01-05 LAB — BASIC METABOLIC PANEL
Anion gap: 8 (ref 5–15)
BUN: 13 mg/dL (ref 6–20)
CALCIUM: 8.3 mg/dL — AB (ref 8.9–10.3)
CO2: 22 mmol/L (ref 22–32)
CREATININE: 0.77 mg/dL (ref 0.44–1.00)
Chloride: 108 mmol/L (ref 101–111)
GFR calc Af Amer: >60 mL/min (ref >60)
GFR calc non Af Amer: >60 mL/min (ref >60)
GLUCOSE: 128 mg/dL — AB (ref 65–99)
Potassium: 3.8 mmol/L (ref 3.5–5.1)
SODIUM: 138 mmol/L (ref 135–145)

## 2018-01-05 LAB — URINE CULTURE

## 2018-01-05 LAB — TROPONIN I: TROPONIN I: 0.1 ng/mL — AB (ref <0.03)

## 2018-01-05 LAB — CK: CK TOTAL: 485 U/L — AB (ref 38–234)

## 2018-01-05 MED ORDER — DIPHENHYDRAMINE HCL 25 MG PO CAPS
25.0000 mg | ORAL_CAPSULE | Freq: Every evening | ORAL | Status: DC | PRN
  Administered 2018-01-05 – 2018-01-08 (×3): 25 mg via ORAL
  Filled 2018-01-05 (×3): qty 1

## 2018-01-05 MED ORDER — HALOPERIDOL LACTATE 5 MG/ML IJ SOLN: 2.0000 mg | Freq: Once | INTRAMUSCULAR | Status: DC

## 2018-01-05 MED ORDER — HALOPERIDOL LACTATE 5 MG/ML IJ SOLN
2.0000 mg | Freq: Four times a day (QID) | INTRAMUSCULAR | Status: AC | PRN
  Administered 2018-01-06 (×2): 2 mg via INTRAMUSCULAR
  Filled 2018-01-05 (×2): qty 1

## 2018-01-05 MED ORDER — METOPROLOL TARTRATE 12.5 MG HALF TABLET
12.5000 mg | ORAL_TABLET | Freq: Two times a day (BID) | ORAL | Status: DC
  Administered 2018-01-05 – 2018-01-10 (×11): 12.5 mg via ORAL
  Filled 2018-01-05 (×11): qty 1

## 2018-01-05 NOTE — Evaluation (Signed)
Physical Therapy Evaluation Patient Details Name: Shelby Jennings MRN: [id number] DOB: Aug 24, 1923 Today's Date: 01/05/2018   History of Present Illness  84 y.o. female admitted on 01/04/18 after being found on the floor at her ALF.  Pt sustained a L pubic ramus fx.  She is WBAT and conservative management.  She had mildly elevated troponin in the ED, but without acute cardiopulmonary symptoms (cardiology was consulted in the ED and does not think pt requres any cardiology intervention).  Other significant PMH of stroke s/p thrombectomy, HTN, and DM2.    Clinical Impression  Pt was able to take a few painful steps with RW OOB to the recliner chair, however, she reports she did not use a RW PTA and I am fearful she will not remember she needs to use it and fall again.  She needs increased support, supervision, and rehab to help her return to her baseline level of independence.      Follow Up Recommendations SNF    Equipment Recommendations  Rolling walker with 5" wheels    Recommendations for Other Services   NA    Precautions / Restrictions Precautions Precautions: Fall Restrictions Weight Bearing Restrictions: Yes RLE Weight Bearing: Weight bearing as tolerated LLE Weight Bearing: Weight bearing as tolerated      Mobility  Bed Mobility Overal bed mobility: Needs Assistance Bed Mobility: Supine to Sit     Supine to sit: Min assist;HOB elevated     General bed mobility comments: Min assist to help pt progress bil legs and trunk to sitting EOB.  Pt with HOB maximally elevated and pulling on therapist's hands for assist.   Transfers Overall transfer level: Needs assistance Equipment used: Rolling walker (2 wheeled) Transfers: Sit to/from Stand Sit to Stand: Min assist         General transfer comment: Min assist to support trunk during transitions and help power up to stand over sore left hip  Ambulation/Gait Ambulation/Gait assistance: Min assist Ambulation Distance  (Feet): 5 Feet Assistive device: Rolling walker (2 wheeled) Gait Pattern/deviations: Step-to pattern;Antalgic     General Gait Details: Pt reports she did not use a RW before, however, she has a moderately antalgic gait pattern when attempting to put weight on her left foot. RW used for support, but I don't know if her baseline cognitive status will allow her to remember to use support for gait.          Balance Overall balance assessment: Needs assistance Sitting-balance support: Feet supported;Bilateral upper extremity supported Sitting balance-Leahy Scale: Fair     Standing balance support: Bilateral upper extremity supported Standing balance-Leahy Scale: Poor Standing balance comment: needs external support.                              Pertinent Vitals/Pain Pain Assessment: Faces Faces Pain Scale: Hurts even more Pain Location: left hip Pain Descriptors / Indicators: Grimacing;Guarding Pain Intervention(s): Limited activity within patient's tolerance;Monitored during session;Repositioned    Home Living                   Additional Comments: from ALF per chart, unsure of previous baseline mobility.     Prior Function                 Hand Dominance   Dominant Hand: Right    Extremity/Trunk Assessment   Upper Extremity Assessment Upper Extremity Assessment: Generalized weakness    Lower Extremity Assessment Lower Extremity Assessment:  LLE deficits/detail LLE Deficits / Details: left leg most notably imparied per functional assessment, antalgic gait pattern with walker.     Cervical / Trunk Assessment Cervical / Trunk Assessment: Kyphotic  Communication   Communication: No difficulties  Cognition Arousal/Alertness: Awake/alert Behavior During Therapy: WFL for tasks assessed/performed Overall Cognitive Status: History of cognitive impairments - at baseline                                 General Comments: Pt asks me  why she is here, does not currently know where she is or where she normally lives.              Assessment/Plan    PT Assessment Patient needs continued PT services  PT Problem List Decreased strength;Decreased activity tolerance;Decreased balance;Decreased mobility;Decreased cognition;Decreased knowledge of use of DME;Decreased safety awareness;Decreased knowledge of precautions;Pain       PT Treatment Interventions DME instruction;Gait training;Stair training;Functional mobility training;Therapeutic activities;Therapeutic exercise;Balance training;Patient/family education;Cognitive remediation    PT Goals (Current goals can be found in the Care Plan section)  Acute Rehab PT Goals Patient Stated Goal: unable to state PT Goal Formulation: Patient unable to participate in goal setting Time For Goal Achievement: 01/12/18 Potential to Achieve Goals: Good    Frequency Min 3X/week           AM-PAC PT "6 Clicks" Daily Activity  Outcome Measure Difficulty turning over in bed (including adjusting bedclothes, sheets and blankets)?: A Little Difficulty moving from lying on back to sitting on the side of the bed? : Unable Difficulty sitting down on and standing up from a chair with arms (e.g., wheelchair, bedside commode, etc,.)?: Unable Help needed moving to and from a bed to chair (including a wheelchair)?: A Little Help needed walking in hospital room?: A Little Help needed climbing 3-5 steps with a railing? : A Little 6 Click Score: 14    End of Session Equipment Utilized During Treatment: Gait belt Activity Tolerance: Patient limited by pain Patient left: in chair;with call bell/phone within reach;with chair alarm set   PT Visit Diagnosis: Muscle weakness (generalized) (M62.81);Difficulty in walking, not elsewhere classified (R26.2);Pain;History of falling (Z91.81) Pain - Right/Left: Left Pain - part of body: Hip    Time: 4656-8127 PT Time Calculation (min) (ACUTE ONLY):  17 min   Charges:         Eidem Guiles B. Teila Skalsky, PT, DPT [phone]    PT Evaluation $PT Eval Moderate Complexity: 1 Mod     01/05/2018, 3:17 PM

## 2018-01-05 NOTE — Progress Notes (Signed)
Patient refuses telemetry monitoring. [facility] notified.

## 2018-01-05 NOTE — Progress Notes (Signed)
Patient pulled out IV. Notified [facility] patients refusal for IV replacement.

## 2018-01-05 NOTE — Progress Notes (Signed)
[facility] PROGRESS NOTE  Shelby Jennings ZOX:[id number] DOB: [birth year] DOA: 01/04/2018 PCP: Renato Shin, MD  Brief summary   84 y.o. female with medical history significant of diabetes type 2, hyperlipidemia, hypertension, osteoarthritis, stroke, and dementia who presents the emergency department after being found this morning on the floor.  Per the staff at the independent living facility they believe patient was on the floor all night long.  Evaluation in the ER revealed a minimally displaced left pubic ramus fracture.  Her troponin was elevated as well however her CPK is elevated.  EKG shows a sinus tachycardia and PACs but is not significantly changed when compared with lie 01/30/2017. ER physician has spoken with orthopedics who recommended weightbearing as tolerated and physical therapy.  Cardiology was phoned and discussed the case with the ER as well patient does not require any cardiological intervention.   Assessment/Plan:  Left pubic ramus fracture: cont pain control. No surgical intervention necessary.  Appreciate orthopedics input. Suspect, she needs more higher level of care due to fall, dementia. Will obtain pt/ot. consult SW  Mild Elevated troponin: no acute cardiopulmonary symptoms. Cont aspirin, bb. Not a candidate for interventions due to advanced age, dementia.   Diabetes type 2: monitor on ISS  Essential hypertension: Patient on no home medications. added BB for HR. Will monitor blood pressures.  Dementia. Confused. Neuro exam is non focal. CT head: no acute findings. resume home regimen. Fall precautions.   Osteoarthritis Tylenol as scheduled. Thyroid mass: This is noted previously in her record.  In 2012 patient refused further work-up. History of right middle cerebral artery stroke: Noted continue daily aspirin. Paroxysmal atrial fibrillation: Patient clearly at risk for falls and is not on anticoagulation. HR is not controlled. Added BB. Monitor    Code  Status: full Family Communication: d/w patient, Therapist, sports. Called [name] [phone]) but no answer. Will try again (indicate person spoken with, relationship, and if by phone, the number) Disposition Plan: needs SNF   Consultants:  Ortho   Procedures:  none  Antibiotics: Anti-infectives (From admission, onward)   None        (indicate start date, and stop date if known)  HPI/Subjective: Alert. Confused. Denies acute pains, no focal weaknesses. Awaiting pt/ot. Likely needs SNF  Objective: Vitals:   01/04/18 1900 01/05/18 0512  BP: (!) 147/98 (!) 141/72  Pulse: (!) 103 85  Resp:  17  Temp: 98.7 F (37.1 C) 97.8 F (36.6 C)  SpO2: 98% 98%    Intake/Output Summary (Last 24 hours) at 01/05/2018 1110 Last data filed at 01/05/2018 0900 Gross per 24 hour  Intake 1815 ml  Output -  Net 1815 ml   Filed Weights   01/04/18 1509  Weight: 68.5 kg (151 lb)    Exam:   General:  No distress   Cardiovascular: s1,s2 rrr  Respiratory: CTA BL  Abdomen: soft, nt   Musculoskeletal: no leg edema    Data Reviewed: Basic Metabolic Panel: Recent Labs  Lab 01/04/18 1200 01/04/18 1211 01/04/18 1505 01/05/18 0303  NA 140 143  --  138  K 3.7 3.7  --  3.8  CL 107 108  --  108  CO2 23  --   --  22  GLUCOSE 153* 150*  --  128*  BUN 11 12  --  13  CREATININE 0.82 0.60 0.75 0.77  CALCIUM 9.1  --   --  8.3*   Liver Function Tests: Recent Labs  Lab 01/04/18 1200  AST 31  ALT 19  ALKPHOS 91  BILITOT 1.1  PROT 7.9  ALBUMIN 3.3*   No results for input(s): LIPASE, AMYLASE in the last 168 hours. No results for input(s): AMMONIA in the last 168 hours. CBC: Recent Labs  Lab 01/04/18 1011 01/04/18 1211 01/04/18 1505  WBC 9.1  --  10.0  NEUTROABS 6.4  --   --   HGB 14.1 15.3* 13.3  HCT 44.5 45.0 41.8  MCV 90.4  --  89.3  PLT 268  --  316   Cardiac Enzymes: Recent Labs  Lab 01/04/18 1200 01/04/18 1505 01/04/18 1946 01/05/18 0303  CKTOTAL 882*  --   --   485*  TROPONINI  --  0.14* 0.14* 0.10*   BNP (last 3 results) No results for input(s): BNP in the last 8760 hours.  ProBNP (last 3 results) No results for input(s): PROBNP in the last 8760 hours.  CBG: No results for input(s): GLUCAP in the last 168 hours.  Recent Results (from the past 240 hour(s))  MRSA PCR Screening     Status: None   Collection Time: 01/04/18  4:47 PM  Result Value Ref Range Status   MRSA by PCR NEGATIVE NEGATIVE Final    Comment:        The GeneXpert MRSA Assay (FDA approved for NASAL specimens only), is one component of a comprehensive MRSA colonization surveillance program. It is not intended to diagnose MRSA infection nor to guide or monitor treatment for MRSA infections. Performed at [facility], [place] [address] [place], [place] [address]      Studies: Dg Chest 1 View  Result Date: 01/04/2018 CLINICAL DATA:  [place] EXAM: CHEST  1 VIEW COMPARISON:  02/16/2017 FINDINGS: No pneumothorax. No evident pleural effusion. Regional bones grossly unremarkable. Aortic Atherosclerosis (ICD10-170.0) Low lung volumes. No focal infiltrate. Heart size upper limits normal for technique. IMPRESSION: 1. Low volumes.  No acute findings. Electronically Signed   By: Lucrezia Europe M.D.   On: 01/04/2018 11:00   Dg Lumbar Spine Complete  Result Date: 01/04/2018 CLINICAL DATA:  [place] EXAM: LUMBAR SPINE - COMPLETE 4+ VIEW COMPARISON:  None. FINDINGS: Negative for fracture. Vertebral body height well maintained throughout. Grade 1 anterolisthesis L4-5. No definite pars defect. Vertebral body and disc height maintained throughout. Patchy aortoiliac arterial calcifications without suggestion of aneurysm. IMPRESSION: 1. Negative for fracture or other acute bone abnormality. 2. Grade 1 anterolisthesis L4-5. 3.  Aortic Atherosclerosis (ICD10-170.0) Electronically Signed   By: Lucrezia Europe M.D.   On: 01/04/2018 11:04   Dg Pelvis 1-2 Views  Result Date: 01/04/2018 CLINICAL DATA:  [place]  EXAM: PELVIS - 1-2 VIEW COMPARISON:  None. FINDINGS: Possible minimal displaced fracture of the superior left pubic ramus. No other pelvic ring fracture. Hips negative. Patchy iliofemoral arterial calcifications. IMPRESSION: 1. Possibly minimally displaced fracture, superior left pubic ramus. Electronically Signed   By: Lucrezia Europe M.D.   On: 01/04/2018 11:02   Ct Head Wo Contrast  Result Date: 01/04/2018 CLINICAL DATA:  Trauma.  Found on floor by bed this morning. EXAM: CT HEAD WITHOUT CONTRAST CT CERVICAL SPINE WITHOUT CONTRAST TECHNIQUE: Multidetector CT imaging of the head and cervical spine was performed following the standard protocol without intravenous contrast. Multiplanar CT image reconstructions of the cervical spine were also generated. COMPARISON:  03/22/2017 FINDINGS: CT HEAD FINDINGS Brain: Again seen is a partially calcified left paraclinoid meningioma measuring 2.9 cm, image 12/4. Unchanged from previous exam. No evidence of acute infarction, hemorrhage, hydrocephalus or extra-axial collection.  There is mild diffuse low-attenuation within the subcortical and periventricular white matter compatible with chronic microvascular disease. Vascular: No hyperdense vessel or unexpected calcification. Skull: Normal. Negative for fracture or focal lesion. Sinuses/Orbits: No acute finding. Other: None. CT CERVICAL SPINE FINDINGS Alignment: Normal. Skull base and vertebrae: No acute fracture. No primary bone lesion or focal pathologic process. Soft tissues and spinal canal: Bilateral facet hypertrophy and degenerative change noted. Disc levels: There is mild disc space narrowing and ventral endplate spurring identified at C5-6, C6-7 and C7-T1. Upper chest: Large nodule in left lobe of thyroid gland measures 2.7 cm, image 80/8. Other: None IMPRESSION: 1. No acute intracranial abnormalities. 2. Similar appearance of left paraclinoid meningioma. 3. The chronic small vessel ischemic disease. 4. No evidence for  cervical spine fracture or dislocation. 5. Incidental note made of a large left lobe of thyroid gland nodule measuring 2.7 cm. Consider further evaluation with thyroid ultrasound. If patient is clinically hyperthyroid, consider nuclear medicine thyroid uptake and scan. Electronically Signed   By: Kerby Moors M.D.   On: 01/04/2018 10:51   Ct Angio Chest Pe W And/or Wo Contrast  Result Date: 01/04/2018 CLINICAL DATA:  Unwitnessed fall.  Lower back pain. EXAM: CT ANGIOGRAPHY CHEST WITH CONTRAST TECHNIQUE: Multidetector CT imaging of the chest was performed using the standard protocol during bolus administration of intravenous contrast. Multiplanar CT image reconstructions and MIPs were obtained to evaluate the vascular anatomy. CONTRAST:  181mL ISOVUE-370 IOPAMIDOL (ISOVUE-370) INJECTION 76% COMPARISON:  None. FINDINGS: Cardiovascular: Satisfactory opacification of the pulmonary arteries to the segmental level. No evidence of pulmonary embolism. Normal heart size. No pericardial effusion. Atherosclerosis of thoracic aorta is noted without aneurysm formation. Mediastinum/Nodes: 2.9 cm left thyroid nodule is noted. The esophagus is unremarkable. No adenopathy is noted. Lungs/Pleura: Lungs are clear. No pleural effusion or pneumothorax. Upper Abdomen: No acute abnormality. Musculoskeletal: No chest wall abnormality. No acute or significant osseous findings. Review of the MIP images confirms the above findings. IMPRESSION: No definite evidence of pulmonary embolus. 2.9 cm left thyroid nodule. Thyroid ultrasound is recommended for further evaluation. Aortic Atherosclerosis (ICD10-I70.0). Electronically Signed   By: Marijo Conception, M.D.   On: 01/04/2018 13:25   Ct Cervical Spine Wo Contrast  Result Date: 01/04/2018 CLINICAL DATA:  Trauma.  Found on floor by bed this morning. EXAM: CT HEAD WITHOUT CONTRAST CT CERVICAL SPINE WITHOUT CONTRAST TECHNIQUE: Multidetector CT imaging of the head and cervical spine was  performed following the standard protocol without intravenous contrast. Multiplanar CT image reconstructions of the cervical spine were also generated. COMPARISON:  03/22/2017 FINDINGS: CT HEAD FINDINGS Brain: Again seen is a partially calcified left paraclinoid meningioma measuring 2.9 cm, image 12/4. Unchanged from previous exam. No evidence of acute infarction, hemorrhage, hydrocephalus or extra-axial collection. There is mild diffuse low-attenuation within the subcortical and periventricular white matter compatible with chronic microvascular disease. Vascular: No hyperdense vessel or unexpected calcification. Skull: Normal. Negative for fracture or focal lesion. Sinuses/Orbits: No acute finding. Other: None. CT CERVICAL SPINE FINDINGS Alignment: Normal. Skull base and vertebrae: No acute fracture. No primary bone lesion or focal pathologic process. Soft tissues and spinal canal: Bilateral facet hypertrophy and degenerative change noted. Disc levels: There is mild disc space narrowing and ventral endplate spurring identified at C5-6, C6-7 and C7-T1. Upper chest: Large nodule in left lobe of thyroid gland measures 2.7 cm, image 80/8. Other: None IMPRESSION: 1. No acute intracranial abnormalities. 2. Similar appearance of left paraclinoid meningioma. 3. The chronic small vessel ischemic  disease. 4. No evidence for cervical spine fracture or dislocation. 5. Incidental note made of a large left lobe of thyroid gland nodule measuring 2.7 cm. Consider further evaluation with thyroid ultrasound. If patient is clinically hyperthyroid, consider nuclear medicine thyroid uptake and scan. Electronically Signed   By: Kerby Moors M.D.   On: 01/04/2018 10:51    Scheduled Meds: . acetaminophen  650 mg Oral Q6H  . aspirin  325 mg Oral Daily  . docusate sodium  100 mg Oral BID  . donepezil  10 mg Oral QHS  . enoxaparin (LOVENOX) injection  40 mg Subcutaneous Q24H  . memantine  5 mg Oral BID  . sodium chloride flush  3  mL Intravenous Q12H   Continuous Infusions: . 0.9 % NaCl with KCl 20 mEq / L 75 mL/hr at 01/04/18 1508    Principal Problem:   Pubic ramus fracture, left, closed, initial encounter ([place]) Active Problems:   Diabetes ([place])   Essential hypertension   Osteoarthritis   Thyroid mass   Stroke ([place]) - Right middle cerebral artery due to right M1 occlusion status post recanalization with mechanical embolectomy- etiology likely embolic - probably due to atrial fibrillation   PAF (paroxysmal atrial fibrillation) (HCC)   Elevated troponin    Time spent: >35 minutes     Kinnie Feil  [facility] Pager [phone]. If 7PM-7AM, please contact night-coverage at [email or web address], password [facility] 01/05/2018, 11:10 AM  LOS: 0 days

## 2018-01-06 DIAGNOSIS — W19XXXA Unspecified fall, initial encounter: Secondary | ICD-10-CM

## 2018-01-06 DIAGNOSIS — I10 Essential (primary) hypertension: Secondary | ICD-10-CM | POA: Diagnosis not present

## 2018-01-06 DIAGNOSIS — S3289XA Fracture of other parts of pelvis, initial encounter for closed fracture: Secondary | ICD-10-CM | POA: Diagnosis not present

## 2018-01-06 DIAGNOSIS — E119 Type 2 diabetes mellitus without complications: Secondary | ICD-10-CM | POA: Diagnosis not present

## 2018-01-06 DIAGNOSIS — S32592A Other specified fracture of left pubis, initial encounter for closed fracture: Secondary | ICD-10-CM | POA: Diagnosis not present

## 2018-01-06 NOTE — Progress Notes (Signed)
PROGRESS NOTE  Shelby Jennings VFI:[id number] DOB: 11/29/1923 DOA: 01/04/2018 PCP: Renato Shin, MD  HPI/Recap of past 24 hours: 84 y.o.femalewith medical history significant ofdiabetes type 2, hyperlipidemia, hypertension, osteoarthritis, stroke, and dementia who presents the emergency department after being found this morning on the floor. Per the staff at the independent living facility they believe patient was on the floor all night long. Evaluation in the ER revealed a minimally displaced left pubic ramus fracture. Her troponin was elevated as well however her CPK is elevated. EKG shows a sinus tachycardia and PACs but is not significantly changed when compared with lie 01/30/2017. ER physician has spoken with orthopedics who recommended weightbearing as tolerated and physical therapy. Cardiology was phoned and discussed the case with the ER as well patient does not require any cardiological intervention.   01/06/2018: Patient seen and examined at bedside.  She is alert but confused.  Unable to obtain a review of systems due to altered mental status.  She does not appear to be in distress.   Assessment/Plan: Principal Problem:   Pubic ramus fracture, left, closed, initial encounter ([place]) Active Problems:   Diabetes ([place])   Essential hypertension   Osteoarthritis   Thyroid mass   Stroke [facility]) - Right middle cerebral artery due to right M1 occlusion status post recanalization with mechanical embolectomy- etiology likely embolic - probably due to atrial fibrillation   PAF (paroxysmal atrial fibrillation) (HCC)   Elevated troponin  Left pubic ramus fracture Orthopedic surgery consulted and recommended conservative management PT recommends SNF Case worker consulted for placement Continue pain management and bowel regimen  Elevated troponin Peaked at 0.14 and trended down Cardiology, no plan for intervention Denies chest pain  Chronic diastolic CHF Last 2D echo done on  02/16/2017 revealed LVEF 65 to 70% with normal wall motion and grade 1 diastolic dysfunction Continue current meds Daily weight and strict I's and Os  Hypertension Continue metoprolol tartrate  Dementia Reorient as needed Fall precautions  History of right middle cerebral artery stroke Continue aspirin  Allergic to statin  Paroxysmal A. fib not on anticoagulation Rate is controlled Continue beta-blocker     Code Status: Full code  Family Communication: None at bedside  Disposition Plan: SNF when clinically stable   Consultants:  Surgery  Cardiology  Procedures:  None  Antimicrobials:  None  DVT prophylaxis: Lovenox subcu   Objective: Vitals:   01/04/18 1530 01/04/18 1900 01/05/18 0512 01/05/18 1318  BP: (!) 161/90 (!) 147/98 (!) 141/72 (!) 159/94  Pulse: (!) 101 (!) 103 85 (!) 102  Resp: (!) 26  17 18   Temp:  98.7 F (37.1 C) 97.8 F (36.6 C)   TempSrc:  Oral Oral   SpO2: 97% 98% 98% 96%  Weight:      Height:        Intake/Output Summary (Last 24 hours) at 01/06/2018 1149 Last data filed at 01/06/2018 0900 Gross per 24 hour  Intake 840 ml  Output 600 ml  Net 240 ml   Filed Weights   01/04/18 1509  Weight: 68.5 kg (151 lb)    Exam:  . General: 84 y.o. year-old female well developed well nourished in no acute distress.  Alert in the setting of dementia. . Cardiovascular: Regular rate and rhythm with no rubs or gallops.  No thyromegaly or JVD noted.   Marland Kitchen Respiratory: Clear to auscultation with no wheezes or rales. Good inspiratory effort. . Abdomen: Soft nontender nondistended with normal bowel sounds x4 quadrants. . Musculoskeletal: Trace  edema bilaterally in lower extremities. Marland Kitchen Psychiatry: unable to assess due to altered mental status.  Data Reviewed: CBC: Recent Labs  Lab 01/04/18 1011 01/04/18 1211 01/04/18 1505  WBC 9.1  --  10.0  NEUTROABS 6.4  --   --   HGB 14.1 15.3* 13.3  HCT 44.5 45.0 41.8  MCV 90.4  --  89.3  PLT 268   --  361   Basic Metabolic Panel: Recent Labs  Lab 01/04/18 1200 01/04/18 1211 01/04/18 1505 01/05/18 0303  NA 140 143  --  138  K 3.7 3.7  --  3.8  CL 107 108  --  108  CO2 23  --   --  22  GLUCOSE 153* 150*  --  128*  BUN 11 12  --  13  CREATININE 0.82 0.60 0.75 0.77  CALCIUM 9.1  --   --  8.3*   GFR: Estimated Creatinine Clearance: 42.6 mL/min (by C-G formula based on SCr of 0.77 mg/dL). Liver Function Tests: Recent Labs  Lab 01/04/18 1200  AST 31  ALT 19  ALKPHOS 91  BILITOT 1.1  PROT 7.9  ALBUMIN 3.3*   No results for input(s): LIPASE, AMYLASE in the last 168 hours. No results for input(s): AMMONIA in the last 168 hours. Coagulation Profile: No results for input(s): INR, PROTIME in the last 168 hours. Cardiac Enzymes: Recent Labs  Lab 01/04/18 1200 01/04/18 1505 01/04/18 1946 01/05/18 0303  CKTOTAL 882*  --   --  485*  TROPONINI  --  0.14* 0.14* 0.10*   BNP (last 3 results) No results for input(s): PROBNP in the last 8760 hours. HbA1C: No results for input(s): HGBA1C in the last 72 hours. CBG: No results for input(s): GLUCAP in the last 168 hours. Lipid Profile: No results for input(s): CHOL, HDL, LDLCALC, TRIG, CHOLHDL, LDLDIRECT in the last 72 hours. Thyroid Function Tests: No results for input(s): TSH, T4TOTAL, FREET4, T3FREE, THYROIDAB in the last 72 hours. Anemia Panel: No results for input(s): VITAMINB12, FOLATE, FERRITIN, TIBC, IRON, RETICCTPCT in the last 72 hours. Urine analysis:    Component Value Date/Time   COLORURINE YELLOW 01/04/2018 [place] 01/04/2018 1506   LABSPEC 1.018 01/04/2018 1506   PHURINE 5.0 01/04/2018 1506   GLUCOSEU NEGATIVE 01/04/2018 1506   GLUCOSEU NEGATIVE 05/20/2013 0952   HGBUR SMALL (A) 01/04/2018 1506   BILIRUBINUR NEGATIVE 01/04/2018 1506   KETONESUR 5 (A) 01/04/2018 1506   PROTEINUR NEGATIVE 01/04/2018 1506   UROBILINOGEN 1.0 05/20/2013 0952   NITRITE NEGATIVE 01/04/2018 1506    LEUKOCYTESUR NEGATIVE 01/04/2018 1506   Sepsis Labs: @LABRCNTIP (procalcitonin:4,lacticidven:4)  ) Recent Results (from the past 240 hour(s))  Urine culture     Status: Abnormal   Collection Time: 01/04/18  3:05 PM  Result Value Ref Range Status   Specimen Description URINE, CLEAN CATCH  Final   Special Requests   Final    CATH Performed at [facility], [place] [address], [place], [place] [address]    Culture MULTIPLE SPECIES PRESENT, SUGGEST RECOLLECTION (A)  Final   Report Status 01/05/2018 FINAL  Final  MRSA PCR Screening     Status: None   Collection Time: 01/04/18  4:47 PM  Result Value Ref Range Status   MRSA by PCR NEGATIVE NEGATIVE Final    Comment:        The GeneXpert MRSA Assay (FDA approved for NASAL specimens only), is one component of a comprehensive MRSA colonization surveillance program. It is not intended  to diagnose MRSA infection nor to guide or monitor treatment for MRSA infections. Performed at [facility], [place] [address], [place], [place] [address]       Studies: No results found.  Scheduled Meds: . acetaminophen  650 mg Oral Q6H  . aspirin  325 mg Oral Daily  . docusate sodium  100 mg Oral BID  . donepezil  10 mg Oral QHS  . enoxaparin (LOVENOX) injection  40 mg Subcutaneous Q24H  . memantine  5 mg Oral BID  . metoprolol tartrate  12.5 mg Oral BID  . sodium chloride flush  3 mL Intravenous Q12H    Continuous Infusions:   LOS: 0 days     Kayleen Memos, MD [facility] Pager [phone]  If 7PM-7AM, please contact night-coverage [email or web address] Password [facility] 01/06/2018, 11:49 AM

## 2018-01-07 DIAGNOSIS — E119 Type 2 diabetes mellitus without complications: Secondary | ICD-10-CM | POA: Diagnosis not present

## 2018-01-07 DIAGNOSIS — I10 Essential (primary) hypertension: Secondary | ICD-10-CM | POA: Diagnosis not present

## 2018-01-07 DIAGNOSIS — S32592A Other specified fracture of left pubis, initial encounter for closed fracture: Secondary | ICD-10-CM | POA: Diagnosis not present

## 2018-01-07 DIAGNOSIS — S3289XA Fracture of other parts of pelvis, initial encounter for closed fracture: Secondary | ICD-10-CM | POA: Diagnosis not present

## 2018-01-07 DIAGNOSIS — W19XXXA Unspecified fall, initial encounter: Secondary | ICD-10-CM | POA: Diagnosis not present

## 2018-01-07 NOTE — NC FL2 (Signed)
[place] LEVEL OF CARE SCREENING TOOL     IDENTIFICATION  Patient Name: Shelby Jennings Birthdate: 1924-07-16 Sex: female Admission Date (Current Location): 01/04/2018  [facility] and [place] Number:  Herbalist and Address:  [facility], [place] [address] [place], [place] [address]      Provider Number: [id number]  Attending Physician Name and Address:  Kayleen Memos, DO  Relative Name and Phone Number:  [name], nephew [phone]    Current Level of Care: Hospital Recommended Level of Care: [place] Prior Approval Number:    Date Approved/Denied:   PASRR Number: [id number]  Discharge Plan: SNF    Current Diagnoses: Patient Active Problem List   Diagnosis Date Noted  . Pubic ramus fracture, left, closed, initial encounter ([place]) 01/04/2018  . Elevated troponin 01/04/2018  . Menopausal state 09/10/2017  . Acute right MCA stroke ([place])   . Benign essential HTN   . Diabetes mellitus type 2 in nonobese (HCC)   . Hyperlipidemia   . PAF (paroxysmal atrial fibrillation) ([place])   . Dysphagia, post-stroke   . Acute respiratory failure with hypoxia ([place])   . Stroke [facility]) - Right middle cerebral artery due to right M1 occlusion status post recanalization with mechanical embolectomy- etiology likely embolic - probably due to atrial fibrillation 02/15/2017  . Stroke with cerebral ischemia ([place]) 02/15/2017  . Memory deficit 06/11/2014  . Thyroid mass 03/23/2011  . Encounter for long-term (current) use of other medications 12/21/2010  . Routine general medical examination at a health care facility 12/21/2010  . KNEE PAIN, RIGHT 12/29/2009  . Diabetes ([place]) 06/24/2009  . PERS HX NONCOMPLIANCE W/MED TX PRS HAZARDS HLTH 06/24/2009  . UNSPECIFIED PRURITIC DISORDER 06/24/2007  . Dyslipidemia 05/09/2007  . Essential hypertension 05/09/2007  . DYSHIDROSIS 05/09/2007  . Osteoarthritis 05/09/2007    Orientation  RESPIRATION BLADDER Height & Weight     (Disoriented)  Normal Incontinent Weight: 151 lb (68.5 kg) Height:  5\' 4"  (162.6 cm)  BEHAVIORAL SYMPTOMS/MOOD NEUROLOGICAL BOWEL NUTRITION STATUS  (Confused)   Continent Diet(See DC Summary)  AMBULATORY STATUS COMMUNICATION OF NEEDS Skin   Limited Assist Verbally Bruising(Pubic rami fracture)                       Personal Care Assistance Level of Assistance  Bathing, Feeding, Dressing Bathing Assistance: Maximum assistance Feeding assistance: Limited assistance Dressing Assistance: Maximum assistance     Functional Limitations Info  Sight, Hearing, Speech Sight Info: Adequate Hearing Info: Adequate Speech Info: Adequate    SPECIAL CARE FACTORS FREQUENCY  PT (By licensed PT), OT (By licensed OT)     PT Frequency: 3x week OT Frequency: 3x week            Contractures      Additional Factors Info  Code Status, Allergies, Psychotropic Code Status Info: Full Allergies Info: Atorvastatin Psychotropic Info: Namenda         Current Medications (01/07/2018):  This is the current hospital active medication list Current Facility-Administered Medications  Medication Dose Route Frequency Provider Last Rate Last Dose  . acetaminophen (TYLENOL) tablet 650 mg  650 mg Oral Q6H Lady Deutscher, MD   650 mg at 01/07/18 0530  . aspirin tablet 325 mg  325 mg Oral Daily Lady Deutscher, MD   325 mg at 01/07/18 0936  . diphenhydrAMINE (BENADRYL) capsule 25 mg  25 mg Oral QHS PRN Blount, Lolita Cram, NP  25 mg at 01/06/18 2058  . docusate sodium (COLACE) capsule 100 mg  100 mg Oral BID Lady Deutscher, MD   100 mg at 01/07/18 0936  . donepezil (ARICEPT) tablet 10 mg  10 mg Oral QHS Lady Deutscher, MD   10 mg at 01/06/18 2058  . enoxaparin (LOVENOX) injection 40 mg  40 mg Subcutaneous Q24H Lady Deutscher, MD   40 mg at 01/06/18 1714  . ibuprofen (ADVIL,MOTRIN) tablet 600 mg  600 mg Oral Q6H PRN Lady Deutscher, MD      .  memantine [facility]) tablet 5 mg  5 mg Oral BID Lady Deutscher, MD   5 mg at 01/07/18 0936  . metoprolol tartrate (LOPRESSOR) tablet 12.5 mg  12.5 mg Oral BID Kinnie Feil, MD   12.5 mg at 01/07/18 0936  . ondansetron (ZOFRAN) tablet 4 mg  4 mg Oral Q6H PRN Lady Deutscher, MD       Or  . ondansetron [facility]) injection 4 mg  4 mg Intravenous Q6H PRN Lady Deutscher, MD      . sodium chloride flush (NS) 0.9 % injection 3 mL  3 mL Intravenous Q12H Lady Deutscher, MD   3 mL at 01/04/18 2141  . traMADol (ULTRAM) tablet 100 mg  100 mg Oral Q6H PRN Lady Deutscher, MD   100 mg at 01/07/18 5397     Discharge Medications: Please see discharge summary for a list of discharge medications.  Relevant Imaging Results:  Relevant Lab Results:   Additional Information SS#: [place], LCSW

## 2018-01-07 NOTE — Progress Notes (Signed)
Physical Therapy Treatment Patient Details Name: Shelby Jennings MRN: [id number] DOB: November 02, 1923 Today's Date: 01/07/2018    History of Present Illness 84 y.o. female admitted on 01/04/18 after being found on the floor at her ALF.  Pt sustained a L pubic ramus fx.  She is WBAT and conservative management.  She had mildly elevated troponin in the ED, but without acute cardiopulmonary symptoms (cardiology was consulted in the ED and does not think pt requres any cardiology intervention).  Other significant PMH of stroke s/p thrombectomy, HTN, and DM2.      PT Comments    Pt performed gait training with min to moderate assistance and max VCs for step by step sequencing.  Pt tolerated session well and continues to progress at a slower rate.  Based on presentation she remains appropriate for SNF placement to improve strength and function.  Pt with little to no carryover for use of RW with out step by step cues.    Follow Up Recommendations  SNF     Equipment Recommendations  Rolling walker with 5" wheels    Recommendations for Other Services       Precautions / Restrictions Precautions Precautions: Fall Restrictions Weight Bearing Restrictions: Yes RLE Weight Bearing: Weight bearing as tolerated LLE Weight Bearing: Weight bearing as tolerated    Mobility  Bed Mobility Overal bed mobility: Needs Assistance             General bed mobility comments: Pt sitting in recliner chair on arrival.    Transfers Overall transfer level: Needs assistance Equipment used: Rolling walker (2 wheeled) Transfers: Sit to/from Stand Sit to Stand: Min assist         General transfer comment: Min assist to support trunk during transitions and help power up to stand over sore left hip  Ambulation/Gait Ambulation/Gait assistance: Min assist;Mod assist Ambulation Distance (Feet): 25 Feet Assistive device: Rolling walker (2 wheeled) Gait Pattern/deviations: Step-to pattern;Trunk flexed;Shuffle      General Gait Details: Pt required cues to step closer to device, assistance for turning and backing with step by step cues for technique.     Stairs             Wheelchair Mobility    Modified Rankin (Stroke Patients Only)       Balance Overall balance assessment: Needs assistance Sitting-balance support: Feet supported;Bilateral upper extremity supported Sitting balance-Leahy Scale: Fair       Standing balance-Leahy Scale: Poor Standing balance comment: needs external support.                             Cognition Arousal/Alertness: Awake/alert Behavior During Therapy: WFL for tasks assessed/performed Overall Cognitive Status: History of cognitive impairments - at baseline                                 General Comments: Pt repetitve in questions and remains unsure why sure is here and does not remember her injury.        Exercises Total Joint Exercises Ankle Circles/Pumps: AROM;Both;10 reps;Supine Long Arc Quad: AROM;Both;10 reps;Seated General Exercises - Lower Extremity Hip Flexion/Marching: AROM;Both;10 reps;Seated    General Comments        Pertinent Vitals/Pain Pain Assessment: 0-10 Faces Pain Scale: Hurts even more Pain Location: left hip Pain Descriptors / Indicators: Grimacing;Guarding Pain Intervention(s): Monitored during session;Repositioned    Home Living  Prior Function            PT Goals (current goals can now be found in the care plan section) Acute Rehab PT Goals Patient Stated Goal: unable to state Potential to Achieve Goals: Good Progress towards PT goals: Progressing toward goals    Frequency    Min 3X/week      PT Plan Current plan remains appropriate    Co-evaluation              AM-PAC PT "6 Clicks" Daily Activity  Outcome Measure  Difficulty turning over in bed (including adjusting bedclothes, sheets and blankets)?: Unable Difficulty moving  from lying on back to sitting on the side of the bed? : Unable Difficulty sitting down on and standing up from a chair with arms (e.g., wheelchair, bedside commode, etc,.)?: Unable Help needed moving to and from a bed to chair (including a wheelchair)?: A Little Help needed walking in hospital room?: A Little Help needed climbing 3-5 steps with a railing? : A Lot 6 Click Score: 11    End of Session Equipment Utilized During Treatment: Gait belt Activity Tolerance: Patient limited by pain Patient left: in chair;with call bell/phone within reach;with chair alarm set   PT Visit Diagnosis: Muscle weakness (generalized) (M62.81);Difficulty in walking, not elsewhere classified (R26.2);Pain;History of falling (Z91.81) Pain - Right/Left: Left Pain - part of body: Hip     Time: 5329-9242 PT Time Calculation (min) (ACUTE ONLY): 21 min  Charges:  $Gait Training: 8-22 mins                    G Codes:       Governor Rooks, PTA pager [phone]    Cristela Blue 01/07/2018, 4:34 PM

## 2018-01-07 NOTE — Plan of Care (Signed)
  Problem: Education: Goal: Knowledge of General Education information will improve Outcome: Progressing   Problem: Health Behavior/Discharge Planning: Goal: Ability to manage health-related needs will improve Outcome: Progressing   Problem: Clinical Measurements: Goal: Ability to maintain clinical measurements within normal limits will improve Outcome: Progressing Goal: Will remain free from infection Outcome: Progressing Goal: Diagnostic test results will improve Outcome: Progressing Goal: Respiratory complications will improve Outcome: Progressing Goal: Cardiovascular complication will be avoided Outcome: Progressing   Problem: Activity: Goal: Risk for activity intolerance will decrease Outcome: Progressing   Problem: Nutrition: Goal: Adequate nutrition will be maintained Outcome: Progressing   Problem: Elimination: Goal: Will not experience complications related to bowel motility Outcome: Progressing Goal: Will not experience complications related to urinary retention Outcome: Progressing   Problem: Pain Managment: Goal: General experience of comfort will improve Outcome: Progressing   Problem: Safety: Goal: Ability to remain free from injury will improve Outcome: Progressing   Problem: Skin Integrity: Goal: Risk for impaired skin integrity will decrease Outcome: Progressing   

## 2018-01-07 NOTE — Social Work (Addendum)
CSW discussed SNF options with nephew Elisabeth Cara. He indicated that he is interested in the [place] area for SNF.   CSW obtained names of SNF's of interest: [facility], [facility], [facility].  CSW will f/u for disposition as SNF will need to obtain Insurance Auth for placement and family will be responsible for transport.  12:40 CSW faxed clinicals to [facility], [facility], [facility]. [facility] indicated that they will not have any beds until the end of the week. CSW called nephew back and advised of same.  CSW will f/u on placement near family in [place] area.  Elissa Hefty, LCSW Clinical Social Worker [phone]

## 2018-01-07 NOTE — Progress Notes (Signed)
PROGRESS NOTE  Shelby Jennings HCW:[id number] DOB: 19-Dec-1923 DOA: 01/04/2018 PCP: Renato Shin, MD  HPI/Recap of past 24 hours: 84 y.o.femalewith medical history significant ofdiabetes type 2, hyperlipidemia, hypertension, osteoarthritis, stroke, and dementia who presents the emergency department after being found this morning on the floor. Per the staff at the independent living facility they believe patient was on the floor all night long. Evaluation in the ER revealed a minimally displaced left pubic ramus fracture. Her troponin was elevated as well however her CPK is elevated. EKG shows a sinus tachycardia and PACs but is not significantly changed when compared with lie 01/30/2017. ER physician has spoken with orthopedics who recommended weightbearing as tolerated and physical therapy. Cardiology was phoned and discussed the case with the ER as well patient does not require any cardiological intervention.   01/06/2018: Patient seen and examined at bedside.  She is alert but confused.  Unable to obtain a review of systems due to altered mental status.  She does not appear to be in distress.  01/07/2018: Patient seen and examined at bedside.  She is more alert today actually she is alert and oriented x3.  She has no new complaints.  She denies any pubic pain.  PT assessed and recommended SNF.  CSW consulted for placement.  Highly appreciated.   Assessment/Plan: Principal Problem:   Pubic ramus fracture, left, closed, initial encounter ([place]) Active Problems:   Diabetes ([place])   Essential hypertension   Osteoarthritis   Thyroid mass   Stroke [facility]) - Right middle cerebral artery due to right M1 occlusion status post recanalization with mechanical embolectomy- etiology likely embolic - probably due to atrial fibrillation   PAF (paroxysmal atrial fibrillation) (HCC)   Elevated troponin  Left pubic ramus fracture, stable Orthopedic surgery consulted and recommended conservative  management PT recommends SNF Case worker consulted for placement Continue pain management and bowel regimen  Delirium, resolved  Elevated troponin most likely demand ischemia Denies chest pain Peaked at 0.14 and trended down Cardiology, no plan for intervention  Chronic diastolic CHF, stable Last 2D echo done on 02/16/2017 revealed LVEF 65 to 70% with normal wall motion and grade 1 diastolic dysfunction Continue current meds Daily weight and strict I's and Os  Hypertension, stable Continue metoprolol tartrate  Reported memory deficit She is alert and oriented x3 today Continue Aricept and Namenda Reorient as needed Fall precautions  History of right middle cerebral artery stroke  Continue aspirin  Allergic to statin  Paroxysmal A. fib not on anticoagulation  Rate is controlled Continue beta-blocker, Lopressor 12.5 mg twice daily     Code Status: Full code  Family Communication: None at bedside  Disposition Plan: SNF when clinically stable   Consultants:  Surgery  Cardiology  Procedures:  None  Antimicrobials:  None  DVT prophylaxis: Lovenox subcu   Objective: Vitals:   01/05/18 0512 01/05/18 1318 01/06/18 1438 01/06/18 1945  BP: (!) 141/72 (!) 159/94 126/82 (!) 151/77  Pulse: 85 (!) 102 (!) 55 84  Resp: 17 18 16 14   Temp:   98.3 F (36.8 C) 97.6 F (36.4 C)  TempSrc: Oral  Oral Oral  SpO2: 98% 96% 97% 96%  Weight:      Height:        Intake/Output Summary (Last 24 hours) at 01/07/2018 1238 Last data filed at 01/07/2018 0900 Gross per 24 hour  Intake 600 ml  Output 600 ml  Net 0 ml   Filed Weights   01/04/18 1509  Weight: 68.5 kg (  151 lb)    Exam:  . General: 84 y.o. year-old female well-developed well-nourished in no acute distress.  Alert and oriented x3.   . Cardiovascular: Regular rate and rhythm with no rubs or gallops.  No thyromegaly or JVD noted. Marland Kitchen Respiratory: Clear to auscultation with no wheezes or rales. Good  inspiratory effort. . Abdomen: Soft nontender nondistended with normal bowel sounds x4 quadrants. . Musculoskeletal: Trace edema bilaterally in lower extremities. Marland Kitchen Psychiatry: unable to assess due to altered mental status.  Data Reviewed: CBC: Recent Labs  Lab 01/04/18 1011 01/04/18 1211 01/04/18 1505  WBC 9.1  --  10.0  NEUTROABS 6.4  --   --   HGB 14.1 15.3* 13.3  HCT 44.5 45.0 41.8  MCV 90.4  --  89.3  PLT 268  --  732   Basic Metabolic Panel: Recent Labs  Lab 01/04/18 1200 01/04/18 1211 01/04/18 1505 01/05/18 0303  NA 140 143  --  138  K 3.7 3.7  --  3.8  CL 107 108  --  108  CO2 23  --   --  22  GLUCOSE 153* 150*  --  128*  BUN 11 12  --  13  CREATININE 0.82 0.60 0.75 0.77  CALCIUM 9.1  --   --  8.3*   GFR: Estimated Creatinine Clearance: 42.6 mL/min (by C-G formula based on SCr of 0.77 mg/dL). Liver Function Tests: Recent Labs  Lab 01/04/18 1200  AST 31  ALT 19  ALKPHOS 91  BILITOT 1.1  PROT 7.9  ALBUMIN 3.3*   No results for input(s): LIPASE, AMYLASE in the last 168 hours. No results for input(s): AMMONIA in the last 168 hours. Coagulation Profile: No results for input(s): INR, PROTIME in the last 168 hours. Cardiac Enzymes: Recent Labs  Lab 01/04/18 1200 01/04/18 1505 01/04/18 1946 01/05/18 0303  CKTOTAL 882*  --   --  485*  TROPONINI  --  0.14* 0.14* 0.10*   BNP (last 3 results) No results for input(s): PROBNP in the last 8760 hours. HbA1C: No results for input(s): HGBA1C in the last 72 hours. CBG: No results for input(s): GLUCAP in the last 168 hours. Lipid Profile: No results for input(s): CHOL, HDL, LDLCALC, TRIG, CHOLHDL, LDLDIRECT in the last 72 hours. Thyroid Function Tests: No results for input(s): TSH, T4TOTAL, FREET4, T3FREE, THYROIDAB in the last 72 hours. Anemia Panel: No results for input(s): VITAMINB12, FOLATE, FERRITIN, TIBC, IRON, RETICCTPCT in the last 72 hours. Urine analysis:    Component Value Date/Time    COLORURINE YELLOW 01/04/2018 [place] 01/04/2018 1506   LABSPEC 1.018 01/04/2018 1506   PHURINE 5.0 01/04/2018 1506   GLUCOSEU NEGATIVE 01/04/2018 1506   GLUCOSEU NEGATIVE 05/20/2013 0952   HGBUR SMALL (A) 01/04/2018 1506   BILIRUBINUR NEGATIVE 01/04/2018 1506   KETONESUR 5 (A) 01/04/2018 1506   PROTEINUR NEGATIVE 01/04/2018 1506   UROBILINOGEN 1.0 05/20/2013 0952   NITRITE NEGATIVE 01/04/2018 1506   LEUKOCYTESUR NEGATIVE 01/04/2018 1506   Sepsis Labs: @LABRCNTIP (procalcitonin:4,lacticidven:4)  ) Recent Results (from the past 240 hour(s))  Urine culture     Status: Abnormal   Collection Time: 01/04/18  3:05 PM  Result Value Ref Range Status   Specimen Description URINE, CLEAN CATCH  Final   Special Requests   Final    CATH Performed at [facility], [place] [address] [place], [place] [address]    Culture MULTIPLE SPECIES PRESENT, SUGGEST RECOLLECTION (A)  Final   Report Status 01/05/2018  FINAL  Final  MRSA PCR Screening     Status: None   Collection Time: 01/04/18  4:47 PM  Result Value Ref Range Status   MRSA by PCR NEGATIVE NEGATIVE Final    Comment:        The GeneXpert MRSA Assay (FDA approved for NASAL specimens only), is one component of a comprehensive MRSA colonization surveillance program. It is not intended to diagnose MRSA infection nor to guide or monitor treatment for MRSA infections. Performed at [facility], [place] [address], [place], [place] [address]       Studies: No results found.  Scheduled Meds: . acetaminophen  650 mg Oral Q6H  . aspirin  325 mg Oral Daily  . docusate sodium  100 mg Oral BID  . donepezil  10 mg Oral QHS  . enoxaparin (LOVENOX) injection  40 mg Subcutaneous Q24H  . memantine  5 mg Oral BID  . metoprolol tartrate  12.5 mg Oral BID  . sodium chloride flush  3 mL Intravenous Q12H    Continuous Infusions:   LOS: 0 days     Kayleen Memos, MD [facility] Pager [phone]  If  7PM-7AM, please contact night-coverage [email or web address] Password [facility] 01/07/2018, 12:38 PM

## 2018-01-07 NOTE — Clinical Social Work Note (Signed)
Clinical Social Work Assessment  Patient Details  Name: Shelby Jennings MRN: [id number] Date of Birth: Sep 15, 1923  Date of referral:  01/07/18               Reason for consult:  Facility Placement                Permission sought to share information with:  Facility Art therapist granted to share information::  Yes, Verbal Permission Granted  Name::     [name]  Agency::  SNF  Relationship::  nephew  Contact Information:     Housing/Transportation Living arrangements for the past 2 months:  [place] of Information:  Other (Comment Required)(Nephew Read Drivers) Patient Interpreter Needed:  None Criminal Activity/Legal Involvement Pertinent to Current Situation/Hospitalization:  No - Comment as needed Significant Relationships:  Other Family Members Lives with:  Facility Resident Do you feel safe going back to the place where you live?  No Need for family participation in patient care:  Yes (Comment)  Care giving concerns:  Pt from [place] at [facility] in [facility] and will need SNF at discharge.  Social Worker assessment / plan:  CSW spoke with nephew, Elisabeth Cara and he confirmed IDL placement prior. He indicated that patient was independent with all ADL's prior to hospitalization. Family prefers patient to completed SNF near him in [place] area. CSW explained the SNF process, placement and barriers. CSW indicated that since patient is in "obs" status we need to get her transitioned to a SNF expeditiously. Family member in agreement. CSW obtained permission to send to SNF's in [employer] and provided three in [place] area.  CSW discussed the transport expense that he would be responsible for if patient able to get a bed in [place] area. Family voiced understanding and is in agreement.   CSW will f/u for disposition.  Employment status:  Retired Nurse, adult PT  Recommendations:  [place] / Referral to community resources:  [place]  Patient/Family's Response to care:  Family appreciative of CSW discussing process. No issues identified at this time.  Patient/Family's Understanding of and Emotional Response to Diagnosis, Current Treatment, and Prognosis:  Family appears to have good understanding of her impairment and is in agreement with recommendation for SNF at discharge. The only concern is location of SNF, as he is the active family member involved in pt's care and he resides in [place] area. CSW validated his concerns and will attempt to obtain placement in the area, however, CSW advised that patient may have to go to SNF in [place] due to "obs" status and time that it may take for SNF's [place] area to respond to [place]. Family member in agreement.  CSW will f/u on disposition.  Emotional Assessment Appearance:  Appears stated age Attitude/Demeanor/Rapport:  (confused) Affect (typically observed):  (confused) Orientation:    Alcohol / Substance use:    Psych involvement (Current and /or in the community):     Discharge Needs  Concerns to be addressed:  Discharge Planning Concerns Readmission within the last 30 days:  No Current discharge risk:  Physical Impairment, Dependent with Mobility Barriers to Discharge:  Ship broker, Other(Facility Placement near [facility])   Normajean [place], LCSW 01/07/2018, 1:19 PM

## 2018-01-08 DIAGNOSIS — R55 Syncope and collapse: Secondary | ICD-10-CM | POA: Diagnosis not present

## 2018-01-08 DIAGNOSIS — I480 Paroxysmal atrial fibrillation: Secondary | ICD-10-CM | POA: Diagnosis not present

## 2018-01-08 DIAGNOSIS — S32592A Other specified fracture of left pubis, initial encounter for closed fracture: Secondary | ICD-10-CM

## 2018-01-08 DIAGNOSIS — I6300 Cerebral infarction due to thrombosis of unspecified precerebral artery: Secondary | ICD-10-CM | POA: Diagnosis not present

## 2018-01-08 DIAGNOSIS — W19XXXD Unspecified fall, subsequent encounter: Secondary | ICD-10-CM | POA: Diagnosis not present

## 2018-01-08 DIAGNOSIS — S3289XA Fracture of other parts of pelvis, initial encounter for closed fracture: Secondary | ICD-10-CM | POA: Diagnosis not present

## 2018-01-08 DIAGNOSIS — I10 Essential (primary) hypertension: Secondary | ICD-10-CM

## 2018-01-08 NOTE — Progress Notes (Signed)
Physical Therapy Treatment Patient Details Name: Shelby Jennings MRN: [id number] DOB: 04/15/1924 Today's Date: 01/08/2018    History of Present Illness 84 y.o. female admitted on 01/04/18 after being found on the floor at her ALF.  Pt sustained a L pubic ramus fx.  She is WBAT and conservative management.  She had mildly elevated troponin in the ED, but without acute cardiopulmonary symptoms (cardiology was consulted in the ED and does not think pt requres any cardiology intervention).  Other significant PMH of stroke s/p thrombectomy, HTN, and DM2.      PT Comments    Pt performed short bout of gait with great difficulty.  She perseverated on wanting to find her little red walker with a seat.  She wanted to leave her room and go to her apart that she insists in on the 3rd floor of this building.  No amount of reorientation was helpful.  Pt required an increase in assistance.      Follow Up Recommendations  SNF     Equipment Recommendations  Rolling walker with 5" wheels    Recommendations for Other Services       Precautions / Restrictions Precautions Precautions: Fall Restrictions Weight Bearing Restrictions: Yes RLE Weight Bearing: Weight bearing as tolerated LLE Weight Bearing: Weight bearing as tolerated    Mobility  Bed Mobility Overal bed mobility: Needs Assistance Bed Mobility: Supine to Sit     Supine to sit: HOB elevated;Mod assist     General bed mobility comments: Pt sitting in recliner chair on arrival.    Transfers Overall transfer level: Needs assistance Equipment used: Rolling walker (2 wheeled) Transfers: Sit to/from Stand Sit to Stand: Mod assist;Max assist;+2 safety/equipment         General transfer comment: Pt with strong posterior lean resting B LEs against bed and required increased time and assistance to become upright.    Ambulation/Gait Ambulation/Gait assistance: Mod assist;+2 physical assistance Ambulation Distance (Feet): 15  Feet Assistive device: Rolling walker (2 wheeled) Gait Pattern/deviations: Step-to pattern;Trunk flexed;Shuffle;Leaning posteriorly     General Gait Details: Required physical assistance to move and direct RW, assistance to remains upright.  Slow shuffling pattern.     Stairs             Wheelchair Mobility    Modified Rankin (Stroke Patients Only)       Balance Overall balance assessment: Needs assistance   Sitting balance-Leahy Scale: Fair       Standing balance-Leahy Scale: Poor                              Cognition Arousal/Alertness: Awake/alert Behavior During Therapy: WFL for tasks assessed/performed Overall Cognitive Status: History of cognitive impairments - at baseline                                 General Comments: Pt wanting to go to her apartment to put her food up.  Pt re-oriented she is in the hospital and cannot leave, she remains to perseverate on getting to her apartment.        Exercises      General Comments        Pertinent Vitals/Pain Pain Assessment: 0-10 Faces Pain Scale: Hurts even more Pain Location: left hip Pain Descriptors / Indicators: Grimacing;Guarding Pain Intervention(s): Repositioned;Monitored during session;Ice applied    Home Living  Prior Function            PT Goals (current goals can now be found in the care plan section) Acute Rehab PT Goals Patient Stated Goal: unable to state Potential to Achieve Goals: Good Progress towards PT goals: Progressing toward goals    Frequency    Min 3X/week      PT Plan Current plan remains appropriate    Co-evaluation              AM-PAC PT "6 Clicks" Daily Activity  Outcome Measure  Difficulty turning over in bed (including adjusting bedclothes, sheets and blankets)?: Unable Difficulty moving from lying on back to sitting on the side of the bed? : Unable Difficulty sitting down on and standing up  from a chair with arms (e.g., wheelchair, bedside commode, etc,.)?: Unable Help needed moving to and from a bed to chair (including a wheelchair)?: A Little Help needed walking in hospital room?: A Little Help needed climbing 3-5 steps with a railing? : A Lot 6 Click Score: 11    End of Session Equipment Utilized During Treatment: Gait belt Activity Tolerance: Patient limited by pain Patient left: in chair;with call bell/phone within reach;with chair alarm set   PT Visit Diagnosis: Muscle weakness (generalized) (M62.81);Difficulty in walking, not elsewhere classified (R26.2);Pain;History of falling (Z91.81) Pain - Right/Left: Left Pain - part of body: Hip     Time: 1660-6004 PT Time Calculation (min) (ACUTE ONLY): 18 min  Charges:  $Gait Training: 8-22 mins                    G Codes:       Governor Rooks, PTA pager [place] 01/08/2018, 5:33 PM

## 2018-01-08 NOTE — Progress Notes (Signed)
PROGRESS NOTE  Shelby Jennings ZOX:[id number] DOB: [birth year] DOA: 01/04/2018 PCP: Renato Shin, MD  HPI  Shelby Jennings is a 84 y.o. year old female with medical history significant for diabetes type 2, hyperlipidemia, hypertension, osteoarthritis, stroke, and dementia who presented on 01/04/2018 with unwitnessed fall and was found down for unknown period of time at her independent living facility and was found to have mentally displaced left pubic ramus fracture and slightly elevated troponin.  Interval History  No acute events overnight  ROS:    Subjective Her pain is well controlled.  Did well with her diet today.  Assessment/Plan: Principal Problem:   Pubic ramus fracture, left, closed, initial encounter ([place]) Active Problems:   Diabetes ([place])   Essential hypertension   Osteoarthritis   Thyroid mass   Stroke ([place]) - Right middle cerebral artery due to right M1 occlusion status post recanalization with mechanical embolectomy- etiology likely embolic - probably due to atrial fibrillation   PAF (paroxysmal atrial fibrillation) (HCC)   Elevated troponin   1. L pubic ramus fracture related to unwitnessed fall.  Patient unable to provide history regarding fall.  No overt etiologies for fall found during this admission (CT head, CT cervical spine, CXR, Pelvis Xr, and CTA negative).  Orthopedic surgery recommends conservative/nonoperative management.  Awaiting SNF placement per PT recommendations.  Continue pain control (tramaadol 100 mg every 6H PRN severe pain, tylenol scheduled, ibuprofen PRN moderate pain) and bowel regimen  2. Elevated troponin, likely demand ischemia related to fall, resolved  Peak troponin of 0.14 that is now down trended.  Denied any chest pain.  EKG showed no acute ischemic changes.  Cardiology was spoken to by ED physician and agree with no need for intervention  3. Chronic diastolic CHF, stable.  Grade 1 diastolic dysfunction on TTE on 02/2017.  Currently  euvolemic.  Continue to monitor her graph hypertension, stable.  Continue metoprolol tartrate  4. Reported memory deficit.  Unclear if official diagnosis of dementia.  Continue Aricept and Namenda.  Patient is alert and oriented to person andplace (hospital) day.  Delirium precautions.  5. History of right MCA stroke.  No appreciable new neurologic deficits.  Continue aspirin  6. Paroxysmal atrial fibrillation. Now rate controllled. Not a candidate for anticoagulation with falls  Continue pressor twice daily 7. T2DM  Code Status: Full code  Family Communication: No family at bedside  Disposition Plan: Awaiting SNF placement   Consultants:  Orthopedics  Procedures:  None  Antimicrobials:  None   Cultures:  None none  Telemetry: no  DVT prophylaxis: Lovenox   Objective: Vitals:   01/07/18 2024 01/08/18 0433 01/08/18 1247 01/08/18 1339  BP: (!) 176/85 (!) 144/97 (!) 173/96 (!) 146/68  Pulse: 70 80 84   Resp: 16 14 14    Temp: 97.6 F (36.4 C) 97.6 F (36.4 C)    TempSrc: Oral Oral    SpO2: 99% 97% 95%   Weight:      Height:        Intake/Output Summary (Last 24 hours) at 01/08/2018 1612 Last data filed at 01/08/2018 1009 Gross per 24 hour  Intake 340 ml  Output 850 ml  Net -510 ml   Filed Weights   01/04/18 1509  Weight: 68.5 kg (151 lb)    Exam:  Constitutional:normal appearing female Eyes: EOMI, anicteric, normal conjunctivae ENMT: Oropharynx with moist mucous membranes, normal dentition Cardiovascular: RRR no MRGs, with no peripheral edema Respiratory: Normal respiratory effort, clear breath sounds  Abdomen: Soft,non-tender, with no  HSM Skin: No rash ulcers, or lesions. Without skin tenting  Neurologic: Grossly no focal neuro deficit.  Psychiatric:Appropriate affect, and mood. Mental status alert though easily distracted. Oriented to person and place ( hospital)  Data Reviewed: CBC: Recent Labs  Lab 01/04/18 1011 01/04/18 1211  01/04/18 1505  WBC 9.1  --  10.0  NEUTROABS 6.4  --   --   HGB 14.1 15.3* 13.3  HCT 44.5 45.0 41.8  MCV 90.4  --  89.3  PLT 268  --  778   Basic Metabolic Panel: Recent Labs  Lab 01/04/18 1200 01/04/18 1211 01/04/18 1505 01/05/18 0303  NA 140 143  --  138  K 3.7 3.7  --  3.8  CL 107 108  --  108  CO2 23  --   --  22  GLUCOSE 153* 150*  --  128*  BUN 11 12  --  13  CREATININE 0.82 0.60 0.75 0.77  CALCIUM 9.1  --   --  8.3*   GFR: Estimated Creatinine Clearance: 42.6 mL/min (by C-G formula based on SCr of 0.77 mg/dL). Liver Function Tests: Recent Labs  Lab 01/04/18 1200  AST 31  ALT 19  ALKPHOS 91  BILITOT 1.1  PROT 7.9  ALBUMIN 3.3*   No results for input(s): LIPASE, AMYLASE in the last 168 hours. No results for input(s): AMMONIA in the last 168 hours. Coagulation Profile: No results for input(s): INR, PROTIME in the last 168 hours. Cardiac Enzymes: Recent Labs  Lab 01/04/18 1200 01/04/18 1505 01/04/18 1946 01/05/18 0303  CKTOTAL 882*  --   --  485*  TROPONINI  --  0.14* 0.14* 0.10*   BNP (last 3 results) No results for input(s): PROBNP in the last 8760 hours. HbA1C: No results for input(s): HGBA1C in the last 72 hours. CBG: No results for input(s): GLUCAP in the last 168 hours. Lipid Profile: No results for input(s): CHOL, HDL, LDLCALC, TRIG, CHOLHDL, LDLDIRECT in the last 72 hours. Thyroid Function Tests: No results for input(s): TSH, T4TOTAL, FREET4, T3FREE, THYROIDAB in the last 72 hours. Anemia Panel: No results for input(s): VITAMINB12, FOLATE, FERRITIN, TIBC, IRON, RETICCTPCT in the last 72 hours. Urine analysis:    Component Value Date/Time   COLORURINE YELLOW 01/04/2018 [place] 01/04/2018 1506   LABSPEC 1.018 01/04/2018 1506   PHURINE 5.0 01/04/2018 1506   GLUCOSEU NEGATIVE 01/04/2018 1506   GLUCOSEU NEGATIVE 05/20/2013 0952   HGBUR SMALL (A) 01/04/2018 1506   BILIRUBINUR NEGATIVE 01/04/2018 1506   KETONESUR 5 (A)  01/04/2018 1506   PROTEINUR NEGATIVE 01/04/2018 1506   UROBILINOGEN 1.0 05/20/2013 0952   NITRITE NEGATIVE 01/04/2018 1506   LEUKOCYTESUR NEGATIVE 01/04/2018 1506   Sepsis Labs: @LABRCNTIP (procalcitonin:4,lacticidven:4)  ) Recent Results (from the past 240 hour(s))  Urine culture     Status: Abnormal   Collection Time: 01/04/18  3:05 PM  Result Value Ref Range Status   Specimen Description URINE, CLEAN CATCH  Final   Special Requests   Final    CATH Performed at [facility], [place] [address] [place], [place] [address]    Culture MULTIPLE SPECIES PRESENT, SUGGEST RECOLLECTION (A)  Final   Report Status 01/05/2018 FINAL  Final  MRSA PCR Screening     Status: None   Collection Time: 01/04/18  4:47 PM  Result Value Ref Range Status   MRSA by PCR NEGATIVE NEGATIVE Final    Comment:        The GeneXpert MRSA Assay (  FDA approved for NASAL specimens only), is one component of a comprehensive MRSA colonization surveillance program. It is not intended to diagnose MRSA infection nor to guide or monitor treatment for MRSA infections. Performed at [facility], [place] [address], [place], [place] [address]       Studies: No results found.  Scheduled Meds: . acetaminophen  650 mg Oral Q6H  . aspirin  325 mg Oral Daily  . docusate sodium  100 mg Oral BID  . donepezil  10 mg Oral QHS  . enoxaparin (LOVENOX) injection  40 mg Subcutaneous Q24H  . memantine  5 mg Oral BID  . metoprolol tartrate  12.5 mg Oral BID  . sodium chloride flush  3 mL Intravenous Q12H    Continuous Infusions:   LOS: 0 days     Desiree Hane, MD [facility] Pager [phone]  If 7PM-7AM, please contact night-coverage [email or web address] Password [facility] 01/08/2018, 4:12 PM

## 2018-01-08 NOTE — Social Work (Signed)
CSW received call from admissions liaison from [facility], they are out of network for pt insurance, however pt family have applied for Medicaid. If pt's Medicaid has been approved then pt will be able to placed at SNF. Admissions liaison will f/u with CSW tomorrow with information regarding Medicaid.   Pt's Medicaid worker is [employer] [phone]. CSW continuing to follow.   Alexander Mt, [place] Work [phone]

## 2018-01-08 NOTE — Evaluation (Signed)
Clinical/Bedside Swallow Evaluation Patient Details  Name: Shelby Jennings MRN: [id number] Date of Birth: [birth year]  Today's Date: 01/08/2018 Time: SLP Start Time (ACUTE ONLY): 48 SLP Stop Time (ACUTE ONLY): 1645 SLP Time Calculation (min) (ACUTE ONLY): 15 min  Past Medical History:  Past Medical History:  Diagnosis Date  . DIABETES MELLITUS, TYPE II 06/24/2009  . HYPERLIPIDEMIA 05/09/2007  . HYPERTENSION 05/09/2007  . Morbid obesity ([place])   . OSTEOARTHRITIS 05/09/2007  . Stroke [facility])    Past Surgical History:  Past Surgical History:  Procedure Laterality Date  . IR PERCUTANEOUS ART THROMBECTOMY/INFUSION INTRACRANIAL INC DIAG ANGIO  02/15/2017  . IR US GUIDE VASC ACCESS RIGHT  02/15/2017  . RADIOLOGY WITH ANESTHESIA N/A 02/15/2017   Procedure: CODE STROKE;  Surgeon: Radiologist, Medication, MD;  Location: [facility];  Service: Radiology;  Laterality: N/A;   HPI:  Shelby Jennings a 84 y.o.femalewith medical history significant ofdiabetes type 2, hyperlipidemia, hypertension, osteoarthritis, stroke, and dementia who presents the emergency department after being found this morning on the floor. Per the staff at the independent living facility they believe patient was on the floor all night long. She was last seen at 7 PM last night. She complained of lower back pain with EMS but is denying any pain at admission. Specifically she is denying any chest pain, arm pain, back pain, abdominal pain, leg pain. She is very confused. Evaluation in the ER revealed a minimally displaced left pubic ramus fracture. Her troponin was elevated as well however her CPK is elevated. EKG shows a sinus tachycardia and PACs but is not significantly changed when compared with lie 01/30/2017. ER physician has spoken with orthopedics who recommended weightbearing as tolerated and physical therapy. Cardiology was phoned and discussed the case with the ER as well patient does not require any cardiological intervention.  Given her age, physical condition and dementia we will admit her into the hospital for overnight monitoring. We will have physical therapy see her and plan discharge to skilled facility for ambulation we will also consider her troponins as we cycle them and repeat an EKG in the morning. I do not expect there will be making any significant interventions regarding her cardiac function especially since the patient denies any pain in her chest.   Assessment / Plan / Recommendation Clinical Impression  Pt with history of dysphagia and latest MBS on 03/13/17 with recommendation of dysphagia 3 with thin liquids and chin tuck. Pt with overt cough when consuming thin liquids without chin tuck. No family present and no information regarding diet at facility. Chest xray and respiratory status has been clear throughout admission. During this session, pt is unable to follow any compensatory swallow strategies d/t confusion and fixation on leaving hospital. With Max support and redirection from SLP, pt consumed 6 oz thin liquids via straw without overt s/s of aspiration. No oral residue noted. Therefore current diet is deemed appropriate, ST to sign off.  SLP Visit Diagnosis: Dysphagia, oropharyngeal phase (R13.12)    Aspiration Risk  Mild aspiration risk    Diet Recommendation Regular;Thin liquid   Liquid Administration via: Straw Medication Administration: Whole meds with liquid Supervision: Staff to assist with self feeding;Patient able to self feed Compensations: Minimize environmental distractions;Slow rate;Small sips/bites Postural Changes: Seated upright at 90 degrees;Remain upright for at least 30 minutes after po intake    Other  Recommendations Oral Care Recommendations: Oral care BID   Follow up Recommendations Skilled Nursing facility  Prognosis Barriers to Reach Goals: Cognitive deficits      Swallow Study   General Date of Onset: 01/04/18 HPI: Shelby Jennings a 84  y.o.femalewith medical history significant ofdiabetes type 2, hyperlipidemia, hypertension, osteoarthritis, stroke, and dementia who presents the emergency department after being found this morning on the floor. Per the staff at the independent living facility they believe patient was on the floor all night long. She was last seen at 7 PM last night. She complained of lower back pain with EMS but is denying any pain at admission. Specifically she is denying any chest pain, arm pain, back pain, abdominal pain, leg pain. She is very confused. Evaluation in the ER revealed a minimally displaced left pubic ramus fracture. Her troponin was elevated as well however her CPK is elevated. EKG shows a sinus tachycardia and PACs but is not significantly changed when compared with lie 01/30/2017. ER physician has spoken with orthopedics who recommended weightbearing as tolerated and physical therapy. Cardiology was phoned and discussed the case with the ER as well patient does not require any cardiological intervention. Given her age, physical condition and dementia we will admit her into the hospital for overnight monitoring. We will have physical therapy see her and plan discharge to skilled facility for ambulation we will also consider her troponins as we cycle them and repeat an EKG in the morning. I do not expect there will be making any significant interventions regarding her cardiac function especially since the patient denies any pain in her chest. Type of Study: Bedside Swallow Evaluation Previous Swallow Assessment: MBS 03/2017 Diet Prior to this Study: Regular;Thin liquids Temperature Spikes Noted: No Respiratory Status: Room air History of Recent Intubation: No Behavior/Cognition: Alert;Confused;Agitated;Impulsive;Uncooperative;Distractible;Requires cueing;Doesn't follow directions Oral Cavity Assessment: Within Functional Limits Oral Care Completed by SLP: No Oral Cavity - Dentition: Adequate  natural dentition Vision: Functional for self-feeding Self-Feeding Abilities: Needs assist(d/t cognition) Patient Positioning: Upright in bed Baseline Vocal Quality: Normal Volitional Cough: Strong Volitional Swallow: Unable to elicit(d/t cognition)    Oral/Motor/Sensory Function Overall Oral Motor/Sensory Function: Within functional limits   Ice Chips Ice chips: Not tested   Thin Liquid Thin Liquid: Within functional limits Presentation: Straw;Self Fed    Nectar Thick Nectar Thick Liquid: Not tested   Honey Thick Honey Thick Liquid: Not tested   Puree Puree: Not tested   Solid   GO   Solid: Within functional limits        Shelby Jennings 01/08/2018,5:17 PM

## 2018-01-09 DIAGNOSIS — I10 Essential (primary) hypertension: Secondary | ICD-10-CM | POA: Diagnosis not present

## 2018-01-09 DIAGNOSIS — S32592A Other specified fracture of left pubis, initial encounter for closed fracture: Secondary | ICD-10-CM | POA: Diagnosis not present

## 2018-01-09 NOTE — Social Work (Signed)
CSW then contacted [employer] worker to see status of application as patient would be able to get to [facility] if she had medicaid. CSW then left a message.  CSW then contacted [facility] and discussed SNF offers in [employer]  [facility] accepted SNF bed at [facility].  CSW f/u with SNF bed offer from SNF. CSW left message.  Elissa Hefty, LCSW Clinical Social Worker [phone]

## 2018-01-09 NOTE — Social Work (Signed)
CSW called [facility] and spoke with Cheri Rous. She advised that [facility] has no out of network benefits however she is f/u on medicaid benefits as that would be the only the way that she can accept patient and she has not heard back from Education officer, museum on [place] application.  CSW then called [employer] and left message for [place].  Elissa Hefty, LCSW Clinical Social Worker [phone]

## 2018-01-09 NOTE — Plan of Care (Signed)
  Problem: Clinical Measurements: Goal: Respiratory complications will improve Outcome: Progressing Goal: Cardiovascular complication will be avoided Outcome: Progressing   Problem: Pain Managment: Goal: General experience of comfort will improve Outcome: Progressing   Problem: Safety: Goal: Ability to remain free from injury will improve Outcome: Progressing   

## 2018-01-09 NOTE — Progress Notes (Signed)
Pt has been resting in bed with eyes closed most of the day. Pt verbalized that she needed to use the bathroom and placed on the bedpan twice without result.  Pt then had 2 incontinent episodes with padding changed.  Pt was assisted with meals all day and tolerated medications well.

## 2018-01-09 NOTE — Progress Notes (Signed)
PROGRESS NOTE  Makalah Asberry POE:[id number] DOB: [birth year] DOA: 01/04/2018 PCP: Renato Shin, MD  HPI/Brief Narrative  Shelby Jennings is a 84 y.o. year old female with medical history significant for diabetes type 2, hyperlipidemia, hypertension, osteoarthritis, stroke, and dementia who presented on 01/04/2018 with unwitnessed fall and was found down for unknown period of time at her independent living facility and was found to have minimally displaced left pubic ramus fracture and slightly elevated troponin likely related to demand ischemia.   Interval History  No acute events overnight  ROS:    Subjective Some bottom/back pain  Assessment/Plan: Principal Problem:   Pubic ramus fracture, left, closed, initial encounter ([place]) Active Problems:   Diabetes ([place])   Essential hypertension   Osteoarthritis   Thyroid mass   Stroke ([place]) - Right middle cerebral artery due to right M1 occlusion status post recanalization with mechanical embolectomy- etiology likely embolic - probably due to atrial fibrillation   PAF (paroxysmal atrial fibrillation) (HCC)   Elevated troponin   1. L pubic ramus fracture related to unwitnessed fall, stable  Patient unable to provide history regarding fall.  No overt etiologies for fall found during this admission (CT head, CT cervical spine, CXR, Pelvis Xr, and CTA negative).  Orthopedic surgery recommends conservative/nonoperative management.  Awaiting SNF placement per PT recommendations.  Continue pain control (tramaadol 100 mg every 6H PRN severe pain, tylenol scheduled, ibuprofen PRN moderate pain) and bowel regimen, frequent turns per nursing  2. Elevated troponin, likely demand ischemia related to fall, resolved  Peak troponin of 0.14 that is now down trended.  Denied any chest pain.  EKG showed no acute ischemic changes.  Cardiology was spoken to by ED physician and agree with no need for intervention  3. Chronic diastolic CHF, stable.  Grade 1  diastolic dysfunction on TTE on 02/2017.  Currently euvolemic.  Continue to monitor her graph hypertension, stable.  Continue metoprolol tartrate  4. Reported memory deficit.  Unclear if official diagnosis of dementia.  Continue Aricept and Namenda.  Patient is alert and oriented to person and place (hospital).  Delirium precautions.  5. History of right MCA stroke.  No appreciable new neurologic deficits.  Continue aspirin  6. Paroxysmal atrial fibrillation. Now rate controllled. Not a candidate for anticoagulation with falls  Continue pressor twice daily  7. T2DM, A1c: 6.6% ( 10/2017). No home medications. Glucose wnl on BMP. monitor  Code Status: Full code  Family Communication: No family at bedside  Disposition Plan: Awaiting SNF placement   Consultants:  Orthopedics  Procedures:  None  Antimicrobials:  None   Cultures:  None none  Telemetry: no  DVT prophylaxis: Lovenox   Objective: Vitals:   01/08/18 1247 01/08/18 1339 01/08/18 2026 01/09/18 0552  BP: (!) 173/96 (!) 146/68 140/60 120/77  Pulse: 84  97 77  Resp: 14     Temp:   98.4 F (36.9 C) 97.6 F (36.4 C)  TempSrc:   Oral Oral  SpO2: 95%  94% 97%  Weight:      Height:        Intake/Output Summary (Last 24 hours) at 01/09/2018 1433 Last data filed at 01/09/2018 0900 Gross per 24 hour  Intake 360 ml  Output -  Net 360 ml   Filed Weights   01/04/18 1509  Weight: 68.5 kg (151 lb)    Exam:  Constitutional:normal appearing elderly female Eyes: EOMI, anicteric, normal conjunctivae ENMT: Oropharynx with moist mucous membranes, normal dentition Cardiovascular: RRR no MRGs, with no  peripheral edema Respiratory: Normal respiratory effort, clear breath sounds  Skin: No rash ulcers, or lesions. Without skin tenting  Neurologic: Grossly no focal neuro deficit.  Psychiatric:Appropriate affect, and mood. Mental status alert though easily distracted. Oriented to person and place ( hospital)  Data  Reviewed: CBC: Recent Labs  Lab 01/04/18 1011 01/04/18 1211 01/04/18 1505  WBC 9.1  --  10.0  NEUTROABS 6.4  --   --   HGB 14.1 15.3* 13.3  HCT 44.5 45.0 41.8  MCV 90.4  --  89.3  PLT 268  --  409   Basic Metabolic Panel: Recent Labs  Lab 01/04/18 1200 01/04/18 1211 01/04/18 1505 01/05/18 0303  NA 140 143  --  138  K 3.7 3.7  --  3.8  CL 107 108  --  108  CO2 23  --   --  22  GLUCOSE 153* 150*  --  128*  BUN 11 12  --  13  CREATININE 0.82 0.60 0.75 0.77  CALCIUM 9.1  --   --  8.3*   GFR: Estimated Creatinine Clearance: 42.6 mL/min (by C-G formula based on SCr of 0.77 mg/dL). Liver Function Tests: Recent Labs  Lab 01/04/18 1200  AST 31  ALT 19  ALKPHOS 91  BILITOT 1.1  PROT 7.9  ALBUMIN 3.3*   No results for input(s): LIPASE, AMYLASE in the last 168 hours. No results for input(s): AMMONIA in the last 168 hours. Coagulation Profile: No results for input(s): INR, PROTIME in the last 168 hours. Cardiac Enzymes: Recent Labs  Lab 01/04/18 1200 01/04/18 1505 01/04/18 1946 01/05/18 0303  CKTOTAL 882*  --   --  485*  TROPONINI  --  0.14* 0.14* 0.10*   BNP (last 3 results) No results for input(s): PROBNP in the last 8760 hours. HbA1C: No results for input(s): HGBA1C in the last 72 hours. CBG: No results for input(s): GLUCAP in the last 168 hours. Lipid Profile: No results for input(s): CHOL, HDL, LDLCALC, TRIG, CHOLHDL, LDLDIRECT in the last 72 hours. Thyroid Function Tests: No results for input(s): TSH, T4TOTAL, FREET4, T3FREE, THYROIDAB in the last 72 hours. Anemia Panel: No results for input(s): VITAMINB12, FOLATE, FERRITIN, TIBC, IRON, RETICCTPCT in the last 72 hours. Urine analysis:    Component Value Date/Time   COLORURINE YELLOW 01/04/2018 [place] 01/04/2018 1506   LABSPEC 1.018 01/04/2018 1506   PHURINE 5.0 01/04/2018 1506   GLUCOSEU NEGATIVE 01/04/2018 1506   GLUCOSEU NEGATIVE 05/20/2013 0952   HGBUR SMALL (A) 01/04/2018  1506   BILIRUBINUR NEGATIVE 01/04/2018 1506   KETONESUR 5 (A) 01/04/2018 1506   PROTEINUR NEGATIVE 01/04/2018 1506   UROBILINOGEN 1.0 05/20/2013 0952   NITRITE NEGATIVE 01/04/2018 1506   LEUKOCYTESUR NEGATIVE 01/04/2018 1506   Sepsis Labs: @LABRCNTIP (procalcitonin:4,lacticidven:4)  ) Recent Results (from the past 240 hour(s))  Urine culture     Status: Abnormal   Collection Time: 01/04/18  3:05 PM  Result Value Ref Range Status   Specimen Description URINE, CLEAN CATCH  Final   Special Requests   Final    CATH Performed at [facility], [place] [address] [place], [place] [address]    Culture MULTIPLE SPECIES PRESENT, SUGGEST RECOLLECTION (A)  Final   Report Status 01/05/2018 FINAL  Final  MRSA PCR Screening     Status: None   Collection Time: 01/04/18  4:47 PM  Result Value Ref Range Status   MRSA by PCR NEGATIVE NEGATIVE Final    Comment:  The GeneXpert MRSA Assay (FDA approved for NASAL specimens only), is one component of a comprehensive MRSA colonization surveillance program. It is not intended to diagnose MRSA infection nor to guide or monitor treatment for MRSA infections. Performed at [facility], [place] [address], [place], [place] [address]       Studies: No results found.  Scheduled Meds: . acetaminophen  650 mg Oral Q6H  . aspirin  325 mg Oral Daily  . docusate sodium  100 mg Oral BID  . donepezil  10 mg Oral QHS  . enoxaparin (LOVENOX) injection  40 mg Subcutaneous Q24H  . memantine  5 mg Oral BID  . metoprolol tartrate  12.5 mg Oral BID  . sodium chloride flush  3 mL Intravenous Q12H    Continuous Infusions:   LOS: 0 days     Desiree Hane, MD [facility] Pager [phone]  If 7PM-7AM, please contact night-coverage [email or web address] Password [facility] 01/09/2018, 2:33 PM

## 2018-01-10 DIAGNOSIS — R402441 Other coma, without documented Glasgow coma scale score, or with partial score reported, in the field [EMT or ambulance]: Secondary | ICD-10-CM | POA: Diagnosis not present

## 2018-01-10 DIAGNOSIS — M2550 Pain in unspecified joint: Secondary | ICD-10-CM | POA: Diagnosis not present

## 2018-01-10 DIAGNOSIS — M21611 Bunion of right foot: Secondary | ICD-10-CM | POA: Diagnosis not present

## 2018-01-10 DIAGNOSIS — R402142 Coma scale, eyes open, spontaneous, at arrival to emergency department: Secondary | ICD-10-CM | POA: Diagnosis not present

## 2018-01-10 DIAGNOSIS — I361 Nonrheumatic tricuspid (valve) insufficiency: Secondary | ICD-10-CM | POA: Diagnosis not present

## 2018-01-10 DIAGNOSIS — F0390 Unspecified dementia without behavioral disturbance: Secondary | ICD-10-CM | POA: Diagnosis present

## 2018-01-10 DIAGNOSIS — R402362 Coma scale, best motor response, obeys commands, at arrival to emergency department: Secondary | ICD-10-CM | POA: Diagnosis not present

## 2018-01-10 DIAGNOSIS — M169 Osteoarthritis of hip, unspecified: Secondary | ICD-10-CM | POA: Diagnosis not present

## 2018-01-10 DIAGNOSIS — R531 Weakness: Secondary | ICD-10-CM | POA: Diagnosis not present

## 2018-01-10 DIAGNOSIS — I69319 Unspecified symptoms and signs involving cognitive functions following cerebral infarction: Secondary | ICD-10-CM | POA: Diagnosis not present

## 2018-01-10 DIAGNOSIS — Z8673 Personal history of transient ischemic attack (TIA), and cerebral infarction without residual deficits: Secondary | ICD-10-CM | POA: Diagnosis not present

## 2018-01-10 DIAGNOSIS — I110 Hypertensive heart disease with heart failure: Secondary | ICD-10-CM | POA: Diagnosis not present

## 2018-01-10 DIAGNOSIS — E785 Hyperlipidemia, unspecified: Secondary | ICD-10-CM | POA: Diagnosis not present

## 2018-01-10 DIAGNOSIS — I700 Atherosclerosis of aorta: Secondary | ICD-10-CM | POA: Diagnosis not present

## 2018-01-10 DIAGNOSIS — Z7401 Bed confinement status: Secondary | ICD-10-CM | POA: Diagnosis not present

## 2018-01-10 DIAGNOSIS — S32592A Other specified fracture of left pubis, initial encounter for closed fracture: Secondary | ICD-10-CM | POA: Diagnosis not present

## 2018-01-10 DIAGNOSIS — R1312 Dysphagia, oropharyngeal phase: Secondary | ICD-10-CM | POA: Diagnosis not present

## 2018-01-10 DIAGNOSIS — E119 Type 2 diabetes mellitus without complications: Secondary | ICD-10-CM | POA: Diagnosis not present

## 2018-01-10 DIAGNOSIS — I6300 Cerebral infarction due to thrombosis of unspecified precerebral artery: Secondary | ICD-10-CM | POA: Diagnosis not present

## 2018-01-10 DIAGNOSIS — I639 Cerebral infarction, unspecified: Secondary | ICD-10-CM | POA: Diagnosis not present

## 2018-01-10 DIAGNOSIS — I10 Essential (primary) hypertension: Secondary | ICD-10-CM | POA: Diagnosis not present

## 2018-01-10 DIAGNOSIS — D329 Benign neoplasm of meninges, unspecified: Secondary | ICD-10-CM | POA: Diagnosis not present

## 2018-01-10 DIAGNOSIS — M6281 Muscle weakness (generalized): Secondary | ICD-10-CM | POA: Diagnosis not present

## 2018-01-10 DIAGNOSIS — R9431 Abnormal electrocardiogram [ECG] [EKG]: Secondary | ICD-10-CM | POA: Diagnosis not present

## 2018-01-10 DIAGNOSIS — G464 Cerebellar stroke syndrome: Secondary | ICD-10-CM | POA: Diagnosis not present

## 2018-01-10 DIAGNOSIS — R000 Tachycardia, unspecified: Secondary | ICD-10-CM | POA: Diagnosis not present

## 2018-01-10 DIAGNOSIS — S32502D Unspecified fracture of left pubis, subsequent encounter for fracture with routine healing: Secondary | ICD-10-CM | POA: Diagnosis not present

## 2018-01-10 DIAGNOSIS — Z888 Allergy status to other drugs, medicaments and biological substances status: Secondary | ICD-10-CM | POA: Diagnosis not present

## 2018-01-10 DIAGNOSIS — Z7982 Long term (current) use of aspirin: Secondary | ICD-10-CM | POA: Diagnosis not present

## 2018-01-10 DIAGNOSIS — R7989 Other specified abnormal findings of blood chemistry: Secondary | ICD-10-CM | POA: Diagnosis not present

## 2018-01-10 DIAGNOSIS — R402252 Coma scale, best verbal response, oriented, at arrival to emergency department: Secondary | ICD-10-CM | POA: Diagnosis not present

## 2018-01-10 DIAGNOSIS — I5032 Chronic diastolic (congestive) heart failure: Secondary | ICD-10-CM | POA: Diagnosis not present

## 2018-01-10 DIAGNOSIS — R479 Unspecified speech disturbances: Secondary | ICD-10-CM | POA: Diagnosis not present

## 2018-01-10 DIAGNOSIS — Z9181 History of falling: Secondary | ICD-10-CM | POA: Diagnosis not present

## 2018-01-10 DIAGNOSIS — G459 Transient cerebral ischemic attack, unspecified: Secondary | ICD-10-CM | POA: Diagnosis not present

## 2018-01-10 DIAGNOSIS — I491 Atrial premature depolarization: Secondary | ICD-10-CM | POA: Diagnosis not present

## 2018-01-10 DIAGNOSIS — S32591D Other specified fracture of right pubis, subsequent encounter for fracture with routine healing: Secondary | ICD-10-CM | POA: Diagnosis not present

## 2018-01-10 DIAGNOSIS — D510 Vitamin B12 deficiency anemia due to intrinsic factor deficiency: Secondary | ICD-10-CM | POA: Diagnosis not present

## 2018-01-10 DIAGNOSIS — E041 Nontoxic single thyroid nodule: Secondary | ICD-10-CM | POA: Diagnosis not present

## 2018-01-10 DIAGNOSIS — N390 Urinary tract infection, site not specified: Secondary | ICD-10-CM | POA: Diagnosis not present

## 2018-01-10 DIAGNOSIS — R4781 Slurred speech: Secondary | ICD-10-CM | POA: Diagnosis not present

## 2018-01-10 DIAGNOSIS — R2689 Other abnormalities of gait and mobility: Secondary | ICD-10-CM | POA: Diagnosis not present

## 2018-01-10 DIAGNOSIS — R488 Other symbolic dysfunctions: Secondary | ICD-10-CM | POA: Diagnosis not present

## 2018-01-10 DIAGNOSIS — I4891 Unspecified atrial fibrillation: Secondary | ICD-10-CM | POA: Diagnosis not present

## 2018-01-10 DIAGNOSIS — R410 Disorientation, unspecified: Secondary | ICD-10-CM | POA: Diagnosis not present

## 2018-01-10 DIAGNOSIS — I480 Paroxysmal atrial fibrillation: Secondary | ICD-10-CM | POA: Diagnosis not present

## 2018-01-10 DIAGNOSIS — Z79899 Other long term (current) drug therapy: Secondary | ICD-10-CM | POA: Diagnosis not present

## 2018-01-10 DIAGNOSIS — M1990 Unspecified osteoarthritis, unspecified site: Secondary | ICD-10-CM | POA: Diagnosis not present

## 2018-01-10 DIAGNOSIS — E1151 Type 2 diabetes mellitus with diabetic peripheral angiopathy without gangrene: Secondary | ICD-10-CM | POA: Diagnosis not present

## 2018-01-10 MED ORDER — ASPIRIN 325 MG PO TABS: 325.0000 mg | ORAL_TABLET | Freq: Every day | ORAL | 0 refills | Status: AC

## 2018-01-10 MED ORDER — ACETAMINOPHEN 325 MG PO TABS: 650.0000 mg | ORAL_TABLET | Freq: Four times a day (QID) | ORAL | Status: AC | PRN

## 2018-01-10 MED ORDER — DOCUSATE SODIUM 100 MG PO CAPS: 100.0000 mg | ORAL_CAPSULE | Freq: Two times a day (BID) | ORAL | 0 refills | Status: AC

## 2018-01-10 MED ORDER — DONEPEZIL HCL 10 MG PO TABS: 10.0000 mg | ORAL_TABLET | Freq: Every day | ORAL | 3 refills | Status: AC

## 2018-01-10 MED ORDER — MEMANTINE HCL 5 MG PO TABS: 5.0000 mg | ORAL_TABLET | Freq: Two times a day (BID) | ORAL | 3 refills | Status: AC

## 2018-01-10 MED ORDER — METOPROLOL TARTRATE 25 MG PO TABS: 12.5000 mg | ORAL_TABLET | Freq: Two times a day (BID) | ORAL | Status: AC

## 2018-01-10 MED ORDER — CYANOCOBALAMIN 1000 MCG/ML IJ SOLN: 1000.0000 ug | INTRAMUSCULAR | 0 refills | Status: AC

## 2018-01-10 NOTE — Social Work (Signed)
Clinical Social Worker facilitated patient discharge including contacting patient family and facility to confirm patient discharge plans.  Clinical information faxed to facility and family agreeable with plan.    CSW arranged ambulance transport via [facility] to [facility].    RN to call [phone] to give report prior to discharge.  Clinical Social Worker will sign off for now as social work intervention is no longer needed. Please consult us again if new need arises.  Cala Kruckenberg, LCSW Clinical Social Worker [phone]      

## 2018-01-10 NOTE — Progress Notes (Signed)
Report given to [place], nurse at facility.

## 2018-01-10 NOTE — Clinical Social Work Placement (Signed)
   CLINICAL SOCIAL WORK PLACEMENT  NOTE  Date:  01/10/2018  Patient Details  Name: Shelby Jennings MRN: [id number] Date of Birth: September 05, 1923  Clinical Social Work is seeking post-discharge placement for this patient at the [place] level of care (*CSW will initial, date and re-position this form in  chart as items are completed):  Yes   Patient/family provided with [place] Work Department's list of facilities offering this level of care within the geographic area requested by the patient (or if unable, by the patient's family).  Yes   Patient/family informed of their freedom to choose among providers that offer the needed level of care, that participate in Medicare, Medicaid or managed care program needed by the patient, have an available bed and are willing to accept the patient.  Yes   Patient/family informed of [facility]'s ownership interest in [facility] and [facility], as well as of the fact that they are under no obligation to receive care at these facilities.  PASRR submitted to EDS on       PASRR number received on       Existing PASRR number confirmed on 01/07/18     FL2 transmitted to all facilities in geographic area requested by pt/family on       FL2 transmitted to all facilities within larger geographic area on 01/07/18     Patient informed that his/her managed care company has contracts with or will negotiate with certain facilities, including the following:        Yes   Patient/family informed of bed offers received.  Patient chooses bed at [facility]     Physician recommends and patient chooses bed at      Patient to be transferred to [facility] on 01/10/18.  Patient to be transferred to facility by [facility]     Patient family notified on 01/10/18 of transfer.  Name of family member notified:  nephew Elisabeth Cara [phone]     PHYSICIAN       Additional Comment:     _______________________________________________ Normajean Baxter, LCSW 01/10/2018, 10:04 AM

## 2018-01-10 NOTE — Social Work (Signed)
CSW was advised by SNF that they have received Insurance Auth for placement.  CSW will f/u for disposition.  Elissa Hefty, LCSW Clinical Social Worker [phone]

## 2018-01-10 NOTE — Progress Notes (Signed)
Physical Therapy Treatment Patient Details Name: Shelby Jennings MRN: [id number] DOB: September 30, 1923 Today's Date: 01/10/2018    History of Present Illness 84 y.o. female admitted on 01/04/18 after being found on the floor at her ALF.  Pt sustained a L pubic ramus fx.  She is WBAT and conservative management.  She had mildly elevated troponin in the ED, but without acute cardiopulmonary symptoms (cardiology was consulted in the ED and does not think pt requres any cardiology intervention).  Other significant PMH of stroke s/p thrombectomy, HTN, and DM2.      PT Comments    Pt performed increased gait and activity with 2 person HHA.  Pt responded better to gait without device.  PLan for transfer to SNF.  Pt remains to require 2 person assistance.  Pt in bed post session with bed alarm set.    Follow Up Recommendations  SNF     Equipment Recommendations  Rolling walker with 5" wheels    Recommendations for Other Services       Precautions / Restrictions Precautions Precautions: Fall Restrictions Weight Bearing Restrictions: Yes RLE Weight Bearing: Weight bearing as tolerated LLE Weight Bearing: Weight bearing as tolerated    Mobility  Bed Mobility Overal bed mobility: Needs Assistance Bed Mobility: Sit to Supine       Sit to supine: Min assist;+2 for physical assistance   General bed mobility comments: Pt performed increased activity with decreased assistance.  Pt able to swing legs into bed and required assistance to position body in bed.    Transfers Overall transfer level: Needs assistance Equipment used: 2 person hand held assist Transfers: Sit to/from Stand Sit to Stand: Mod assist;+2 physical assistance         General transfer comment: Cues for hand placement to and from seated surface.    Ambulation/Gait Ambulation/Gait assistance: Min assist;Mod assist;+2 physical assistance Ambulation Distance (Feet): 120 Feet Assistive device: 2 person hand held assist Gait  Pattern/deviations: Step-to pattern;Trunk flexed;Shuffle;Leaning posteriorly;Step-through pattern     General Gait Details: Pt performed improved gait without device and able to progress gait distance with B UE hand held assist. Pt with flexed hips during gait training.     Stairs             Wheelchair Mobility    Modified Rankin (Stroke Patients Only)       Balance Overall balance assessment: Needs assistance Sitting-balance support: Feet supported;Bilateral upper extremity supported Sitting balance-Leahy Scale: Fair     Standing balance support: Bilateral upper extremity supported Standing balance-Leahy Scale: Poor Standing balance comment: needs external support.                             Cognition Arousal/Alertness: Awake/alert Behavior During Therapy: WFL for tasks assessed/performed Overall Cognitive Status: History of cognitive impairments - at baseline                                 General Comments: Pt more pleasant and agreeable to follow commands.        Exercises      General Comments        Pertinent Vitals/Pain Pain Assessment: Faces Faces Pain Scale: Hurts a little bit Pain Location: left hip Pain Descriptors / Indicators: Grimacing;Guarding Pain Intervention(s): Monitored during session;Repositioned;Ice applied    Home Living  Prior Function            PT Goals (current goals can now be found in the care plan section) Acute Rehab PT Goals Patient Stated Goal: unable to state Potential to Achieve Goals: Good Progress towards PT goals: Progressing toward goals    Frequency    Min 3X/week      PT Plan Current plan remains appropriate    Co-evaluation              AM-PAC PT "6 Clicks" Daily Activity  Outcome Measure  Difficulty turning over in bed (including adjusting bedclothes, sheets and blankets)?: Unable Difficulty moving from lying on back to sitting on  the side of the bed? : Unable Difficulty sitting down on and standing up from a chair with arms (e.g., wheelchair, bedside commode, etc,.)?: Unable Help needed moving to and from a bed to chair (including a wheelchair)?: A Little Help needed walking in hospital room?: A Little Help needed climbing 3-5 steps with a railing? : A Little 6 Click Score: 12    End of Session Equipment Utilized During Treatment: Gait belt Activity Tolerance: Patient limited by pain Patient left: in chair;with call bell/phone within reach;with chair alarm set Nurse Communication: Mobility status PT Visit Diagnosis: Muscle weakness (generalized) (M62.81);Difficulty in walking, not elsewhere classified (R26.2);Pain;History of falling (Z91.81) Pain - Right/Left: Left Pain - part of body: Hip     Time: 3748-2707 PT Time Calculation (min) (ACUTE ONLY): 17 min  Charges:  $Gait Training: 8-22 mins                    G Codes:       Governor Rooks, PTA pager [phone]    Cristela Blue 01/10/2018, 2:04 PM

## 2018-01-10 NOTE — Discharge Summary (Signed)
Discharge Summary  Shelby Jennings KWI:[id number] DOB: [birth year]  PCP: Renato Shin, MD  Admit date: 01/04/2018 Discharge date:    Time spent: < 25 minutes  Admitted From: Home Disposition:  SNF  Recommendations for Outpatient Follow-up:  1. Follow up with PCP in 1-2 weeks. 2. Monitor HR, metoprolol was started to achieve rate control for A fib.    Equipment/Devices:Rolling walker with 5" wheels  Discharge Diagnoses:  Active Hospital Problems   Diagnosis Date Noted  . Pubic ramus fracture, left, closed, initial encounter ([place]) 01/04/2018  . Elevated troponin 01/04/2018  . PAF (paroxysmal atrial fibrillation) ([place])   . Stroke [facility]) - Right middle cerebral artery due to right M1 occlusion status post recanalization with mechanical embolectomy- etiology likely embolic - probably due to atrial fibrillation 02/15/2017  . Thyroid mass 03/23/2011  . Diabetes ([place]) 06/24/2009  . Essential hypertension 05/09/2007  . Osteoarthritis 05/09/2007    Resolved Hospital Problems  No resolved problems to display.    Discharge Condition: Stable CODE STATUS:FULL Diet recommendation: Heart Healthy   Vitals:   01/09/18 2015 01/10/18 0502  BP: 129/79 (!) 143/69  Pulse: 61 78  Resp:    Temp: 98.1 F (36.7 C) 98.2 F (36.8 C)  SpO2: 98% 98%    History of present illness:  Shelby Jennings is a 84 y.o. year old female with medical history significant for 84 y.o. year old female with medical history significant for diabetes type 2, hyperlipidemia, hypertension, osteoarthritis, stroke, and dementia who presented on 01/04/2018 with unwitnessed fall and was found down for unknown period of time at her independent living facility and was found to have minimally displaced left pubic ramus fracture. Remaining hospital course addressed in problem based format below:   Hospital Course:  Principal Problem:   Pubic ramus fracture, left, closed, initial encounter [facility]) Active Problems:   Diabetes  (Shelby Jennings)   Essential hypertension   Osteoarthritis   Thyroid mass   Stroke ([place]) - Right middle cerebral artery due to right M1 occlusion status post recanalization with mechanical embolectomy- etiology likely embolic - probably due to atrial fibrillation   PAF (paroxysmal atrial fibrillation) ([facility])   Elevated troponin   1. L pubic ramus fracture related to unwitnessed fall, stable  Orthopedics, Dr. Stann Mainland recommend nonoperative management (weight bearing as tolerated and physical therapy)in discussion with ED provider. Patient was admitted for pain control and mobilization with physical therapyunable to provide history regarding fall.  No overt etiologies for fall found during this admission (CT head, CT cervical spine, CXR, Pelvis Xr, and CTA negative).  PT recommended SNF to improve strength and function.   Continue pain with control tylenol PRN. No tramadol on discharge as patient has not required in last 24 hours ( of note previously on tylenol 8 scheduled for OA)   2. Elevated troponin, likely demand ischemia related to fall, resolved.  Peak troponin of 0.14 -->0.10.  Denied any chest pain.  EKG showed no acute ischemic changes.  Cardiology was spoken to by ED physician and agree with no need for intervention. Continue aspirin and beta blocker  3. Chronic diastolic CHF, stable.  Grade 1 diastolic dysfunction on TTE on 02/2017. Euvolemic throughout hospitalization. Continue Lopressor  4. Dementia.  Did well with monitoring on delirium precautions.   Continue Aricept and Namenda.    5. History of right MCA stroke.  No appreciable new neurologic deficits.  Continue aspirin 325 mg  6. Paroxysmal atrial fibrillation,  rate controllled. Intermittent HR in 100s-110s that improved with  initiation of oral lopressor during hospital stay. Continue lopressor twice daily. Not a candidate for anticoagulation with falls. Returned to normal sinus rhythm on discharge  7.    T2DM, A1c: 6.6% ( 10/2017). No  home medications. Glucose wnl on monitoring   Antibiotics: none  Microbiology:none  Consultations:  Ortho   Procedures/Studies: Dg Chest 1 View  Result Date: 01/04/2018 CLINICAL DATA:  [place] EXAM: CHEST  1 VIEW COMPARISON:  02/16/2017 FINDINGS: No pneumothorax. No evident pleural effusion. Regional bones grossly unremarkable. Aortic Atherosclerosis (ICD10-170.0) Low lung volumes. No focal infiltrate. Heart size upper limits normal for technique. IMPRESSION: 1. Low volumes.  No acute findings. Electronically Signed   By: Lucrezia Europe M.D.   On: 01/04/2018 11:00   Dg Lumbar Spine Complete  Result Date: 01/04/2018 CLINICAL DATA:  [place] EXAM: LUMBAR SPINE - COMPLETE 4+ VIEW COMPARISON:  None. FINDINGS: Negative for fracture. Vertebral body height well maintained throughout. Grade 1 anterolisthesis L4-5. No definite pars defect. Vertebral body and disc height maintained throughout. Patchy aortoiliac arterial calcifications without suggestion of aneurysm. IMPRESSION: 1. Negative for fracture or other acute bone abnormality. 2. Grade 1 anterolisthesis L4-5. 3.  Aortic Atherosclerosis (ICD10-170.0) Electronically Signed   By: Lucrezia Europe M.D.   On: 01/04/2018 11:04   Dg Pelvis 1-2 Views  Result Date: 01/04/2018 CLINICAL DATA:  [place] EXAM: PELVIS - 1-2 VIEW COMPARISON:  None. FINDINGS: Possible minimal displaced fracture of the superior left pubic ramus. No other pelvic ring fracture. Hips negative. Patchy iliofemoral arterial calcifications. IMPRESSION: 1. Possibly minimally displaced fracture, superior left pubic ramus. Electronically Signed   By: Lucrezia Europe M.D.   On: 01/04/2018 11:02   Ct Head Wo Contrast  Result Date: 01/04/2018 CLINICAL DATA:  Trauma.  Found on floor by bed this morning. EXAM: CT HEAD WITHOUT CONTRAST CT CERVICAL SPINE WITHOUT CONTRAST TECHNIQUE: Multidetector CT imaging of the head and cervical spine was performed following the standard protocol without intravenous contrast.  Multiplanar CT image reconstructions of the cervical spine were also generated. COMPARISON:  03/22/2017 FINDINGS: CT HEAD FINDINGS Brain: Again seen is a partially calcified left paraclinoid meningioma measuring 2.9 cm, image 12/4. Unchanged from previous exam. No evidence of acute infarction, hemorrhage, hydrocephalus or extra-axial collection. There is mild diffuse low-attenuation within the subcortical and periventricular white matter compatible with chronic microvascular disease. Vascular: No hyperdense vessel or unexpected calcification. Skull: Normal. Negative for fracture or focal lesion. Sinuses/Orbits: No acute finding. Other: None. CT CERVICAL SPINE FINDINGS Alignment: Normal. Skull base and vertebrae: No acute fracture. No primary bone lesion or focal pathologic process. Soft tissues and spinal canal: Bilateral facet hypertrophy and degenerative change noted. Disc levels: There is mild disc space narrowing and ventral endplate spurring identified at C5-6, C6-7 and C7-T1. Upper chest: Large nodule in left lobe of thyroid gland measures 2.7 cm, image 80/8. Other: None IMPRESSION: 1. No acute intracranial abnormalities. 2. Similar appearance of left paraclinoid meningioma. 3. The chronic small vessel ischemic disease. 4. No evidence for cervical spine fracture or dislocation. 5. Incidental note made of a large left lobe of thyroid gland nodule measuring 2.7 cm. Consider further evaluation with thyroid ultrasound. If patient is clinically hyperthyroid, consider nuclear medicine thyroid uptake and scan. Electronically Signed   By: Kerby Moors M.D.   On: 01/04/2018 10:51   Ct Angio Chest Pe W And/or Wo Contrast  Result Date: 01/04/2018 CLINICAL DATA:  Unwitnessed fall.  Lower back pain. EXAM: CT ANGIOGRAPHY CHEST WITH CONTRAST TECHNIQUE: Multidetector CT imaging of  the chest was performed using the standard protocol during bolus administration of intravenous contrast. Multiplanar CT image reconstructions  and MIPs were obtained to evaluate the vascular anatomy. CONTRAST:  157mL ISOVUE-370 IOPAMIDOL (ISOVUE-370) INJECTION 76% COMPARISON:  None. FINDINGS: Cardiovascular: Satisfactory opacification of the pulmonary arteries to the segmental level. No evidence of pulmonary embolism. Normal heart size. No pericardial effusion. Atherosclerosis of thoracic aorta is noted without aneurysm formation. Mediastinum/Nodes: 2.9 cm left thyroid nodule is noted. The esophagus is unremarkable. No adenopathy is noted. Lungs/Pleura: Lungs are clear. No pleural effusion or pneumothorax. Upper Abdomen: No acute abnormality. Musculoskeletal: No chest wall abnormality. No acute or significant osseous findings. Review of the MIP images confirms the above findings. IMPRESSION: No definite evidence of pulmonary embolus. 2.9 cm left thyroid nodule. Thyroid ultrasound is recommended for further evaluation. Aortic Atherosclerosis (ICD10-I70.0). Electronically Signed   By: Marijo Conception, M.D.   On: 01/04/2018 13:25   Ct Cervical Spine Wo Contrast  Result Date: 01/04/2018 CLINICAL DATA:  Trauma.  Found on floor by bed this morning. EXAM: CT HEAD WITHOUT CONTRAST CT CERVICAL SPINE WITHOUT CONTRAST TECHNIQUE: Multidetector CT imaging of the head and cervical spine was performed following the standard protocol without intravenous contrast. Multiplanar CT image reconstructions of the cervical spine were also generated. COMPARISON:  03/22/2017 FINDINGS: CT HEAD FINDINGS Brain: Again seen is a partially calcified left paraclinoid meningioma measuring 2.9 cm, image 12/4. Unchanged from previous exam. No evidence of acute infarction, hemorrhage, hydrocephalus or extra-axial collection. There is mild diffuse low-attenuation within the subcortical and periventricular white matter compatible with chronic microvascular disease. Vascular: No hyperdense vessel or unexpected calcification. Skull: Normal. Negative for fracture or focal lesion.  Sinuses/Orbits: No acute finding. Other: None. CT CERVICAL SPINE FINDINGS Alignment: Normal. Skull base and vertebrae: No acute fracture. No primary bone lesion or focal pathologic process. Soft tissues and spinal canal: Bilateral facet hypertrophy and degenerative change noted. Disc levels: There is mild disc space narrowing and ventral endplate spurring identified at C5-6, C6-7 and C7-T1. Upper chest: Large nodule in left lobe of thyroid gland measures 2.7 cm, image 80/8. Other: None IMPRESSION: 1. No acute intracranial abnormalities. 2. Similar appearance of left paraclinoid meningioma. 3. The chronic small vessel ischemic disease. 4. No evidence for cervical spine fracture or dislocation. 5. Incidental note made of a large left lobe of thyroid gland nodule measuring 2.7 cm. Consider further evaluation with thyroid ultrasound. If patient is clinically hyperthyroid, consider nuclear medicine thyroid uptake and scan. Electronically Signed   By: Kerby Moors M.D.   On: 01/04/2018 10:51     Discharge Exam: BP (!) 143/69   Pulse 78   Temp 98.2 F (36.8 C) (Oral)   Resp 16   Ht 5\' 4"  (1.626 m)   Wt 68.5 kg (151 lb)   SpO2 98%   BMI 25.92 kg/m   General: older female, no distress Eyes: EOMI, anicteric ENT: Oral Mucosa clear and moist Cardiovascular: regular rate and rhythm, no murmurs, rubs or gallops, no edema, no JVD Respiratory: Normal respiratory effort on room air, lungs clear to auscultation bilaterally Abdomen: soft, non-distended, non-tender, normal bowel sounds Skin: No Rash Neurologic: Grossly no focal neuro deficit.Mental status alert and oriented to person, place, and context, not time Psychiatric:Appropriate affect, and mood   Discharge Instructions You were cared for by a hospitalist during your hospital stay. If you have any questions about your discharge medications or the care you received while you were in the hospital after you are  discharged, you can call the unit and  asked to speak with the hospitalist on call if the hospitalist that took care of you is not available. Once you are discharged, your primary care physician will handle any further medical issues. Please note that NO REFILLS for any discharge medications will be authorized once you are discharged, as it is imperative that you return to your primary care physician (or establish a relationship with a primary care physician if you do not have one) for your aftercare needs so that they can reassess your need for medications and monitor your lab values.  Discharge Instructions    Diet - low sodium heart healthy   Complete by:  As directed    Increase activity slowly   Complete by:  As directed      Allergies as of 01/10/2018      Reactions   Atorvastatin Other (See Comments)   Myalgia      Medication List    STOP taking these medications   TYLENOL 8 HOUR ARTHRITIS PAIN PO Replaced by:  acetaminophen 325 MG tablet     TAKE these medications   acetaminophen 325 MG tablet Commonly known as:  TYLENOL Take 2 tablets (650 mg total) by mouth every 6 (six) hours as needed. Replaces:  TYLENOL 8 HOUR ARTHRITIS PAIN PO   aspirin 325 MG tablet Take 1 tablet (325 mg total) by mouth daily.   cyanocobalamin 1000 MCG/ML injection Commonly known as:  (VITAMIN B-12) Inject 1 mL (1,000 mcg total) into the muscle every 30 (thirty) days. Reported on 10/31/2015   docusate sodium 100 MG capsule Commonly known as:  COLACE Take 1 capsule (100 mg total) by mouth 2 (two) times daily.   donepezil 10 MG tablet Commonly known as:  ARICEPT Take 1 tablet (10 mg total) by mouth at bedtime.   memantine 5 MG tablet Commonly known as:  NAMENDA Take 1 tablet (5 mg total) by mouth 2 (two) times daily.   metoprolol tartrate 25 MG tablet Commonly known as:  LOPRESSOR Take 0.5 tablets (12.5 mg total) by mouth 2 (two) times daily.      Allergies  Allergen Reactions  . Atorvastatin Other (See Comments)     Myalgia    Contact information for after-discharge care    Destination    [facility] .   Service:  Skilled Nursing Contact information: [address] [place] [phone]               The results of significant diagnostics from this hospitalization (including imaging, microbiology, ancillary and laboratory) are listed below for reference.    Significant Diagnostic Studies: Dg Chest 1 View  Result Date: 01/04/2018 CLINICAL DATA:  [place] EXAM: CHEST  1 VIEW COMPARISON:  02/16/2017 FINDINGS: No pneumothorax. No evident pleural effusion. Regional bones grossly unremarkable. Aortic Atherosclerosis (ICD10-170.0) Low lung volumes. No focal infiltrate. Heart size upper limits normal for technique. IMPRESSION: 1. Low volumes.  No acute findings. Electronically Signed   By: Lucrezia Europe M.D.   On: 01/04/2018 11:00   Dg Lumbar Spine Complete  Result Date: 01/04/2018 CLINICAL DATA:  [place] EXAM: LUMBAR SPINE - COMPLETE 4+ VIEW COMPARISON:  None. FINDINGS: Negative for fracture. Vertebral body height well maintained throughout. Grade 1 anterolisthesis L4-5. No definite pars defect. Vertebral body and disc height maintained throughout. Patchy aortoiliac arterial calcifications without suggestion of aneurysm. IMPRESSION: 1. Negative for fracture or other acute bone abnormality. 2. Grade 1 anterolisthesis L4-5. 3.  Aortic Atherosclerosis (ICD10-170.0) Electronically Signed   By: Lucrezia Europe M.D.   On: 01/04/2018 11:04   Dg Pelvis 1-2 Views  Result Date: 01/04/2018 CLINICAL DATA:  [place] EXAM: PELVIS - 1-2 VIEW COMPARISON:  None. FINDINGS: Possible minimal displaced fracture of the superior left pubic ramus. No other pelvic ring fracture. Hips negative. Patchy iliofemoral arterial calcifications. IMPRESSION: 1. Possibly minimally displaced fracture, superior left pubic ramus. Electronically Signed   By: Lucrezia Europe M.D.   On: 01/04/2018 11:02   Ct Head Wo  Contrast  Result Date: 01/04/2018 CLINICAL DATA:  Trauma.  Found on floor by bed this morning. EXAM: CT HEAD WITHOUT CONTRAST CT CERVICAL SPINE WITHOUT CONTRAST TECHNIQUE: Multidetector CT imaging of the head and cervical spine was performed following the standard protocol without intravenous contrast. Multiplanar CT image reconstructions of the cervical spine were also generated. COMPARISON:  03/22/2017 FINDINGS: CT HEAD FINDINGS Brain: Again seen is a partially calcified left paraclinoid meningioma measuring 2.9 cm, image 12/4. Unchanged from previous exam. No evidence of acute infarction, hemorrhage, hydrocephalus or extra-axial collection. There is mild diffuse low-attenuation within the subcortical and periventricular white matter compatible with chronic microvascular disease. Vascular: No hyperdense vessel or unexpected calcification. Skull: Normal. Negative for fracture or focal lesion. Sinuses/Orbits: No acute finding. Other: None. CT CERVICAL SPINE FINDINGS Alignment: Normal. Skull base and vertebrae: No acute fracture. No primary bone lesion or focal pathologic process. Soft tissues and spinal canal: Bilateral facet hypertrophy and degenerative change noted. Disc levels: There is mild disc space narrowing and ventral endplate spurring identified at C5-6, C6-7 and C7-T1. Upper chest: Large nodule in left lobe of thyroid gland measures 2.7 cm, image 80/8. Other: None IMPRESSION: 1. No acute intracranial abnormalities. 2. Similar appearance of left paraclinoid meningioma. 3. The chronic small vessel ischemic disease. 4. No evidence for cervical spine fracture or dislocation. 5. Incidental note made of a large left lobe of thyroid gland nodule measuring 2.7 cm. Consider further evaluation with thyroid ultrasound. If patient is clinically hyperthyroid, consider nuclear medicine thyroid uptake and scan. Electronically Signed   By: Kerby Moors M.D.   On: 01/04/2018 10:51   Ct Angio Chest Pe W And/or Wo  Contrast  Result Date: 01/04/2018 CLINICAL DATA:  Unwitnessed fall.  Lower back pain. EXAM: CT ANGIOGRAPHY CHEST WITH CONTRAST TECHNIQUE: Multidetector CT imaging of the chest was performed using the standard protocol during bolus administration of intravenous contrast. Multiplanar CT image reconstructions and MIPs were obtained to evaluate the vascular anatomy. CONTRAST:  165mL ISOVUE-370 IOPAMIDOL (ISOVUE-370) INJECTION 76% COMPARISON:  None. FINDINGS: Cardiovascular: Satisfactory opacification of the pulmonary arteries to the segmental level. No evidence of pulmonary embolism. Normal heart size. No pericardial effusion. Atherosclerosis of thoracic aorta is noted without aneurysm formation. Mediastinum/Nodes: 2.9 cm left thyroid nodule is noted. The esophagus is unremarkable. No adenopathy is noted. Lungs/Pleura: Lungs are clear. No pleural effusion or pneumothorax. Upper Abdomen: No acute abnormality. Musculoskeletal: No chest wall abnormality. No acute or significant osseous findings. Review of the MIP images confirms the above findings. IMPRESSION: No definite evidence of pulmonary embolus. 2.9 cm left thyroid nodule. Thyroid ultrasound is recommended for further evaluation. Aortic Atherosclerosis (ICD10-I70.0). Electronically Signed   By: Marijo Conception, M.D.   On: 01/04/2018 13:25   Ct Cervical Spine Wo Contrast  Result Date: 01/04/2018 CLINICAL DATA:  Trauma.  Found on floor by bed this morning. EXAM: CT HEAD WITHOUT CONTRAST CT CERVICAL SPINE WITHOUT CONTRAST TECHNIQUE: Multidetector CT imaging of the head and  cervical spine was performed following the standard protocol without intravenous contrast. Multiplanar CT image reconstructions of the cervical spine were also generated. COMPARISON:  03/22/2017 FINDINGS: CT HEAD FINDINGS Brain: Again seen is a partially calcified left paraclinoid meningioma measuring 2.9 cm, image 12/4. Unchanged from previous exam. No evidence of acute infarction, hemorrhage,  hydrocephalus or extra-axial collection. There is mild diffuse low-attenuation within the subcortical and periventricular white matter compatible with chronic microvascular disease. Vascular: No hyperdense vessel or unexpected calcification. Skull: Normal. Negative for fracture or focal lesion. Sinuses/Orbits: No acute finding. Other: None. CT CERVICAL SPINE FINDINGS Alignment: Normal. Skull base and vertebrae: No acute fracture. No primary bone lesion or focal pathologic process. Soft tissues and spinal canal: Bilateral facet hypertrophy and degenerative change noted. Disc levels: There is mild disc space narrowing and ventral endplate spurring identified at C5-6, C6-7 and C7-T1. Upper chest: Large nodule in left lobe of thyroid gland measures 2.7 cm, image 80/8. Other: None IMPRESSION: 1. No acute intracranial abnormalities. 2. Similar appearance of left paraclinoid meningioma. 3. The chronic small vessel ischemic disease. 4. No evidence for cervical spine fracture or dislocation. 5. Incidental note made of a large left lobe of thyroid gland nodule measuring 2.7 cm. Consider further evaluation with thyroid ultrasound. If patient is clinically hyperthyroid, consider nuclear medicine thyroid uptake and scan. Electronically Signed   By: Kerby Moors M.D.   On: 01/04/2018 10:51    Microbiology: Recent Results (from the past 240 hour(s))  Urine culture     Status: Abnormal   Collection Time: 01/04/18  3:05 PM  Result Value Ref Range Status   Specimen Description URINE, CLEAN CATCH  Final   Special Requests   Final    CATH Performed at [facility], [place] [address] [place], [place] [address]    Culture MULTIPLE SPECIES PRESENT, SUGGEST RECOLLECTION (A)  Final   Report Status 01/05/2018 FINAL  Final  MRSA PCR Screening     Status: None   Collection Time: 01/04/18  4:47 PM  Result Value Ref Range Status   MRSA by PCR NEGATIVE NEGATIVE Final    Comment:        The GeneXpert MRSA Assay  (FDA approved for NASAL specimens only), is one component of a comprehensive MRSA colonization surveillance program. It is not intended to diagnose MRSA infection nor to guide or monitor treatment for MRSA infections. Performed at [facility], [place] [address] [place], [place] [address]      Labs: Basic Metabolic Panel: Recent Labs  Lab 01/04/18 1200 01/04/18 1211 01/04/18 1505 01/05/18 0303  NA 140 143  --  138  K 3.7 3.7  --  3.8  CL 107 108  --  108  CO2 23  --   --  22  GLUCOSE 153* 150*  --  128*  BUN 11 12  --  13  CREATININE 0.82 0.60 0.75 0.77  CALCIUM 9.1  --   --  8.3*   Liver Function Tests: Recent Labs  Lab 01/04/18 1200  AST 31  ALT 19  ALKPHOS 91  BILITOT 1.1  PROT 7.9  ALBUMIN 3.3*   No results for input(s): LIPASE, AMYLASE in the last 168 hours. No results for input(s): AMMONIA in the last 168 hours. CBC: Recent Labs  Lab 01/04/18 1011 01/04/18 1211 01/04/18 1505  WBC 9.1  --  10.0  NEUTROABS 6.4  --   --   HGB 14.1 15.3* 13.3  HCT 44.5 45.0 41.8  MCV 90.4  --  89.3  PLT 268  --  316   Cardiac Enzymes: Recent Labs  Lab 01/04/18 1200 01/04/18 1505 01/04/18 1946 01/05/18 0303  CKTOTAL 882*  --   --  485*  TROPONINI  --  0.14* 0.14* 0.10*   BNP: BNP (last 3 results) No results for input(s): BNP in the last 8760 hours.  ProBNP (last 3 results) No results for input(s): PROBNP in the last 8760 hours.  CBG: No results for input(s): GLUCAP in the last 168 hours.     Signed:  Desiree Hane, MD [facility] 01/10/2018, 10:48 AM

## 2018-01-14 DIAGNOSIS — I4891 Unspecified atrial fibrillation: Secondary | ICD-10-CM | POA: Diagnosis not present

## 2018-01-14 DIAGNOSIS — S32591D Other specified fracture of right pubis, subsequent encounter for fracture with routine healing: Secondary | ICD-10-CM | POA: Diagnosis not present

## 2018-01-14 DIAGNOSIS — E119 Type 2 diabetes mellitus without complications: Secondary | ICD-10-CM | POA: Diagnosis not present

## 2018-01-17 DIAGNOSIS — S32591D Other specified fracture of right pubis, subsequent encounter for fracture with routine healing: Secondary | ICD-10-CM | POA: Diagnosis not present

## 2018-01-17 DIAGNOSIS — R410 Disorientation, unspecified: Secondary | ICD-10-CM | POA: Diagnosis not present

## 2018-01-17 DIAGNOSIS — I4891 Unspecified atrial fibrillation: Secondary | ICD-10-CM | POA: Diagnosis not present

## 2018-01-22 DIAGNOSIS — I10 Essential (primary) hypertension: Secondary | ICD-10-CM | POA: Diagnosis not present

## 2018-01-22 DIAGNOSIS — S32591D Other specified fracture of right pubis, subsequent encounter for fracture with routine healing: Secondary | ICD-10-CM | POA: Diagnosis not present

## 2018-01-22 DIAGNOSIS — I4891 Unspecified atrial fibrillation: Secondary | ICD-10-CM | POA: Diagnosis not present

## 2018-01-29 DIAGNOSIS — I4891 Unspecified atrial fibrillation: Secondary | ICD-10-CM | POA: Diagnosis not present

## 2018-01-29 DIAGNOSIS — S32591D Other specified fracture of right pubis, subsequent encounter for fracture with routine healing: Secondary | ICD-10-CM | POA: Diagnosis not present

## 2018-01-29 DIAGNOSIS — I69319 Unspecified symptoms and signs involving cognitive functions following cerebral infarction: Secondary | ICD-10-CM | POA: Diagnosis not present

## 2018-02-11 DIAGNOSIS — S32591D Other specified fracture of right pubis, subsequent encounter for fracture with routine healing: Secondary | ICD-10-CM | POA: Diagnosis not present

## 2018-02-11 DIAGNOSIS — E119 Type 2 diabetes mellitus without complications: Secondary | ICD-10-CM | POA: Diagnosis not present

## 2018-02-11 DIAGNOSIS — I4891 Unspecified atrial fibrillation: Secondary | ICD-10-CM | POA: Diagnosis not present

## 2018-02-14 ENCOUNTER — Encounter (HOSPITAL_COMMUNITY): Payer: Self-pay

## 2018-02-14 ENCOUNTER — Emergency Department (HOSPITAL_COMMUNITY): Payer: Medicare Other

## 2018-02-14 ENCOUNTER — Inpatient Hospital Stay (HOSPITAL_COMMUNITY)
Admission: EM | Admit: 2018-02-14 | Discharge: 2018-02-17 | DRG: 065 | Disposition: A | Discharge status: Skilled Nursing Facility | Payer: Medicare Other | Attending: Internal Medicine | Admitting: Internal Medicine

## 2018-02-14 ENCOUNTER — Other Ambulatory Visit: Payer: Self-pay

## 2018-02-14 DIAGNOSIS — M21611 Bunion of right foot: Secondary | ICD-10-CM | POA: Diagnosis not present

## 2018-02-14 DIAGNOSIS — F0390 Unspecified dementia without behavioral disturbance: Secondary | ICD-10-CM | POA: Diagnosis present

## 2018-02-14 DIAGNOSIS — I639 Cerebral infarction, unspecified: Principal | ICD-10-CM | POA: Diagnosis present

## 2018-02-14 DIAGNOSIS — I6381 Other cerebral infarction due to occlusion or stenosis of small artery: Secondary | ICD-10-CM

## 2018-02-14 DIAGNOSIS — Z7982 Long term (current) use of aspirin: Secondary | ICD-10-CM

## 2018-02-14 DIAGNOSIS — I480 Paroxysmal atrial fibrillation: Secondary | ICD-10-CM | POA: Diagnosis not present

## 2018-02-14 DIAGNOSIS — E119 Type 2 diabetes mellitus without complications: Secondary | ICD-10-CM

## 2018-02-14 DIAGNOSIS — R9431 Abnormal electrocardiogram [ECG] [EKG]: Secondary | ICD-10-CM | POA: Diagnosis not present

## 2018-02-14 DIAGNOSIS — I6300 Cerebral infarction due to thrombosis of unspecified precerebral artery: Secondary | ICD-10-CM | POA: Diagnosis not present

## 2018-02-14 DIAGNOSIS — I10 Essential (primary) hypertension: Secondary | ICD-10-CM | POA: Diagnosis present

## 2018-02-14 DIAGNOSIS — Z9181 History of falling: Secondary | ICD-10-CM | POA: Diagnosis not present

## 2018-02-14 DIAGNOSIS — R4781 Slurred speech: Secondary | ICD-10-CM | POA: Diagnosis present

## 2018-02-14 DIAGNOSIS — I4891 Unspecified atrial fibrillation: Secondary | ICD-10-CM | POA: Diagnosis not present

## 2018-02-14 DIAGNOSIS — I361 Nonrheumatic tricuspid (valve) insufficiency: Secondary | ICD-10-CM | POA: Diagnosis not present

## 2018-02-14 DIAGNOSIS — R402362 Coma scale, best motor response, obeys commands, at arrival to emergency department: Secondary | ICD-10-CM | POA: Diagnosis present

## 2018-02-14 DIAGNOSIS — R488 Other symbolic dysfunctions: Secondary | ICD-10-CM | POA: Diagnosis not present

## 2018-02-14 DIAGNOSIS — M6281 Muscle weakness (generalized): Secondary | ICD-10-CM | POA: Diagnosis not present

## 2018-02-14 DIAGNOSIS — Z79899 Other long term (current) drug therapy: Secondary | ICD-10-CM

## 2018-02-14 DIAGNOSIS — N390 Urinary tract infection, site not specified: Secondary | ICD-10-CM | POA: Diagnosis not present

## 2018-02-14 DIAGNOSIS — E785 Hyperlipidemia, unspecified: Secondary | ICD-10-CM | POA: Diagnosis present

## 2018-02-14 DIAGNOSIS — Z8673 Personal history of transient ischemic attack (TIA), and cerebral infarction without residual deficits: Secondary | ICD-10-CM

## 2018-02-14 DIAGNOSIS — R531 Weakness: Secondary | ICD-10-CM | POA: Diagnosis not present

## 2018-02-14 DIAGNOSIS — R402142 Coma scale, eyes open, spontaneous, at arrival to emergency department: Secondary | ICD-10-CM | POA: Diagnosis not present

## 2018-02-14 DIAGNOSIS — D329 Benign neoplasm of meninges, unspecified: Secondary | ICD-10-CM | POA: Diagnosis present

## 2018-02-14 DIAGNOSIS — R2689 Other abnormalities of gait and mobility: Secondary | ICD-10-CM | POA: Diagnosis not present

## 2018-02-14 DIAGNOSIS — G459 Transient cerebral ischemic attack, unspecified: Secondary | ICD-10-CM | POA: Diagnosis not present

## 2018-02-14 DIAGNOSIS — E1151 Type 2 diabetes mellitus with diabetic peripheral angiopathy without gangrene: Secondary | ICD-10-CM | POA: Diagnosis present

## 2018-02-14 DIAGNOSIS — M169 Osteoarthritis of hip, unspecified: Secondary | ICD-10-CM | POA: Diagnosis not present

## 2018-02-14 DIAGNOSIS — M2550 Pain in unspecified joint: Secondary | ICD-10-CM | POA: Diagnosis not present

## 2018-02-14 DIAGNOSIS — G464 Cerebellar stroke syndrome: Secondary | ICD-10-CM | POA: Diagnosis not present

## 2018-02-14 DIAGNOSIS — R479 Unspecified speech disturbances: Secondary | ICD-10-CM | POA: Diagnosis not present

## 2018-02-14 DIAGNOSIS — Z7401 Bed confinement status: Secondary | ICD-10-CM | POA: Diagnosis not present

## 2018-02-14 DIAGNOSIS — R410 Disorientation, unspecified: Secondary | ICD-10-CM | POA: Diagnosis not present

## 2018-02-14 DIAGNOSIS — S32502D Unspecified fracture of left pubis, subsequent encounter for fracture with routine healing: Secondary | ICD-10-CM | POA: Diagnosis not present

## 2018-02-14 DIAGNOSIS — D510 Vitamin B12 deficiency anemia due to intrinsic factor deficiency: Secondary | ICD-10-CM | POA: Diagnosis not present

## 2018-02-14 DIAGNOSIS — R402441 Other coma, without documented Glasgow coma scale score, or with partial score reported, in the field [EMT or ambulance]: Secondary | ICD-10-CM | POA: Diagnosis not present

## 2018-02-14 DIAGNOSIS — R1312 Dysphagia, oropharyngeal phase: Secondary | ICD-10-CM | POA: Diagnosis not present

## 2018-02-14 DIAGNOSIS — R402252 Coma scale, best verbal response, oriented, at arrival to emergency department: Secondary | ICD-10-CM | POA: Diagnosis present

## 2018-02-14 HISTORY — DX: Nontoxic single thyroid nodule: E04.1

## 2018-02-14 HISTORY — DX: Deficiency of other specified B group vitamins: E53.8

## 2018-02-14 HISTORY — DX: Rhabdomyolysis: M62.82

## 2018-02-14 HISTORY — DX: Other cerebral infarction due to occlusion or stenosis of small artery: I63.81

## 2018-02-14 HISTORY — DX: Cerebral infarction, unspecified: I63.9

## 2018-02-14 LAB — URINALYSIS, ROUTINE W REFLEX MICROSCOPIC
Bilirubin Urine: NEGATIVE
GLUCOSE, UA: NEGATIVE mg/dL
Hgb urine dipstick: NEGATIVE
Ketones, ur: 5 mg/dL — AB
Nitrite: NEGATIVE
PH: 6 (ref 5.0–8.0)
PROTEIN: NEGATIVE mg/dL
SPECIFIC GRAVITY, URINE: 1.017 (ref 1.005–1.030)

## 2018-02-14 LAB — COMPREHENSIVE METABOLIC PANEL
ALT: 11 U/L (ref 0–44)
AST: 19 U/L (ref 15–41)
Albumin: 3.2 g/dL — ABNORMAL LOW (ref 3.5–5.0)
Alkaline Phosphatase: 86 U/L (ref 38–126)
Anion gap: 10 (ref 5–15)
BUN: 14 mg/dL (ref 8–23)
CALCIUM: 9 mg/dL (ref 8.9–10.3)
CO2: 24 mmol/L (ref 22–32)
Chloride: 107 mmol/L (ref 98–111)
Creatinine, Ser: 0.86 mg/dL (ref 0.44–1.00)
GFR calc non Af Amer: 57 mL/min — ABNORMAL LOW (ref >60)
Glucose, Bld: 117 mg/dL — ABNORMAL HIGH (ref 70–99)
Potassium: 3.7 mmol/L (ref 3.5–5.1)
SODIUM: 141 mmol/L (ref 135–145)
Total Bilirubin: 1 mg/dL (ref 0.3–1.2)
Total Protein: 7.1 g/dL (ref 6.5–8.1)

## 2018-02-14 LAB — CBC
HEMATOCRIT: 43.6 % (ref 36.0–46.0)
HEMOGLOBIN: 13.9 g/dL (ref 12.0–15.0)
MCH: 29.8 pg (ref 26.0–34.0)
MCHC: 31.9 g/dL (ref 30.0–36.0)
MCV: 93.6 fL (ref 78.0–100.0)
Platelets: 345 10*3/uL (ref 150–400)
RBC: 4.66 MIL/uL (ref 3.87–5.11)
RDW: 15.3 % (ref 11.5–15.5)
WBC: 11.8 10*3/uL — ABNORMAL HIGH (ref 4.0–10.5)

## 2018-02-14 LAB — I-STAT TROPONIN, ED: TROPONIN I, POC: 0 ng/mL (ref 0.00–0.08)

## 2018-02-14 MED ORDER — ACETAMINOPHEN 160 MG/5ML PO SOLN: 650.0000 mg | ORAL | Status: DC | PRN

## 2018-02-14 MED ORDER — ACETAMINOPHEN 325 MG PO TABS: 650.0000 mg | ORAL_TABLET | ORAL | Status: DC | PRN

## 2018-02-14 MED ORDER — ASPIRIN 300 MG RE SUPP: 300.0000 mg | Freq: Every day | RECTAL | Status: DC

## 2018-02-14 MED ORDER — ASPIRIN 325 MG PO TABS
325.0000 mg | ORAL_TABLET | Freq: Every day | ORAL | Status: DC
  Administered 2018-02-15 – 2018-02-17 (×3): 325 mg via ORAL
  Filled 2018-02-14 (×3): qty 1

## 2018-02-14 MED ORDER — STROKE: EARLY STAGES OF RECOVERY BOOK
Freq: Once | Status: DC
  Filled 2018-02-14: qty 1

## 2018-02-14 MED ORDER — SODIUM CHLORIDE 0.9 % IV SOLN
1.0000 g | INTRAVENOUS | Status: DC
  Administered 2018-02-15 – 2018-02-16 (×2): 1 g via INTRAVENOUS
  Filled 2018-02-14 (×2): qty 10

## 2018-02-14 MED ORDER — CLOPIDOGREL BISULFATE 75 MG PO TABS
75.0000 mg | ORAL_TABLET | Freq: Every day | ORAL | Status: DC
  Administered 2018-02-15 – 2018-02-17 (×3): 75 mg via ORAL
  Filled 2018-02-14 (×3): qty 1

## 2018-02-14 MED ORDER — ACETAMINOPHEN 650 MG RE SUPP
650.0000 mg | RECTAL | Status: DC | PRN
  Administered 2018-02-15: 650 mg via RECTAL
  Filled 2018-02-14: qty 1

## 2018-02-14 MED ORDER — PRAVASTATIN SODIUM 40 MG PO TABS
80.0000 mg | ORAL_TABLET | Freq: Every day | ORAL | Status: AC
  Administered 2018-02-15 – 2018-02-16 (×2): 80 mg via ORAL
  Filled 2018-02-14 (×2): qty 2

## 2018-02-14 MED ORDER — INSULIN ASPART 100 UNIT/ML ~~LOC~~ SOLN
0.0000 [IU] | Freq: Three times a day (TID) | SUBCUTANEOUS | Status: DC
  Administered 2018-02-15 (×2): 1 [IU] via SUBCUTANEOUS
  Administered 2018-02-16: 2 [IU] via SUBCUTANEOUS
  Administered 2018-02-17 (×2): 1 [IU] via SUBCUTANEOUS

## 2018-02-14 MED ORDER — INSULIN ASPART 100 UNIT/ML ~~LOC~~ SOLN: 0.0000 [IU] | Freq: Every day | SUBCUTANEOUS | Status: DC

## 2018-02-14 MED ORDER — LORAZEPAM 2 MG/ML IJ SOLN
0.5000 mg | Freq: Once | INTRAMUSCULAR | Status: AC
  Administered 2018-02-14: 0.5 mg via INTRAVENOUS
  Filled 2018-02-14: qty 1

## 2018-02-14 NOTE — ED Notes (Signed)
Patient transported to CT 

## 2018-02-14 NOTE — ED Notes (Signed)
MRI called this RN stating that they need the pt to have a CT of the head (previously discontinued per MD verbal order) in order to be cleared for MRI; Provider notified, will re-order head CT per verbal order by ED provider

## 2018-02-14 NOTE — H&P (Addendum)
TRH H&P   Patient Demographics:    Shelby Jennings, is a 84 y.o. female  MRN: [id number]   DOB - [birth year]  Admit Date - 02/14/2018  Outpatient Primary MD for the patient is Renato Shin, MD   PCP at [facility] [place] [phone]  Referring MD/NP/PA:  Aletta Edouard  Outpatient Specialists:     Patient coming from:  [facility]  Chief Complaint  Patient presents with  . Transient Ischemic Attack      HPI:    Shelby Jennings  is a 84 y.o. female, w hypertension, hyperlipidemia, h/o stroke right temporal, right occiptial , right frontal, right basal ganglia,  (02/15/2017) , left paraclinoid meningioma, dementia  has c/o right upper ext weakeness starting about 11:30am.     In ED,  CXR => no active disease  CT brain IMPRESSION: 1. No acute findings. 2. Stable appearance of the previously characterized LEFT paraclinoid meningioma. 3. Chronic small vessel ischemic changes within the white matter.  MRI brain IMPRESSION: 1. Patchy small volume acute ischemic nonhemorrhagic areas of acute to early subacute infarction involving the right basal ganglia and right periatrial white matter. 2. Moderate chronic small vessel ischemic disease with scattered remote infarcts as above. 3. 2.8 x 2.3 cm left paraclinoid meningioma without mass effect.  Urinalysis wbc 6-10 Na 141, K 3.7, Bun 14, Creatinine 0.86 Ast 19, Alt 11 Alb 3.2 Wbc 11.8, hgb 13.9, Plt 345  Trop 0.00  Neurology consulted by ED, but has not responded yet to ED.   Pt will be admitted for acute CVA    Review of systems:    In addition to the HPI above,  + dementia,  No Fever-chills, No Headache, No changes with Vision or hearing, No problems swallowing food or Liquids, No Chest pain, Cough or Shortness of Breath, No Abdominal pain, No Nausea or Vommitting, Bowel  movements are regular, No Blood in stool or Urine, No dysuria, No new skin rashes or bruises, No new joints pains-aches,   No recent weight gain or loss, No polyuria, polydypsia or polyphagia, No significant Mental Stressors.  A full 10 point Review of Systems was done, except as stated above, all other Review of Systems were negative.   With Past History of the following :    Past Medical History:  Diagnosis Date  . DIABETES MELLITUS, TYPE II 06/24/2009  . HYPERLIPIDEMIA 05/09/2007  . HYPERTENSION 05/09/2007  . Morbid obesity ([place])   . OSTEOARTHRITIS 05/09/2007  . Stroke ([place])   . Stroke of right basal ganglia ([place]) 02/14/2018      Past Surgical History:  Procedure Laterality Date  . IR PERCUTANEOUS ART THROMBECTOMY/INFUSION INTRACRANIAL INC DIAG ANGIO  02/15/2017  . IR US GUIDE VASC ACCESS RIGHT  02/15/2017  . RADIOLOGY WITH ANESTHESIA N/A 02/15/2017   Procedure: CODE STROKE;  Surgeon: Radiologist, Medication, MD;  Location: [facility];  Service: Radiology;  Laterality: N/A;      Social History:     Social History   Tobacco Use  . Smoking status: Never Smoker  . Smokeless tobacco: Never Used  Substance Use Topics  . Alcohol use: No     Lives - at SNF, [facility] - unclear if walks by self, has dementia   Family History :     Family History  Problem Relation Age of Onset  . Cancer Mother        had uncertain type of cancer       Home Medications:   Prior to Admission medications   Medication Sig Start Date End Date Taking? Authorizing Provider  acetaminophen (TYLENOL) 325 MG tablet Take 2 tablets (650 mg total) by mouth every 6 (six) hours as needed. 01/10/18  Yes Oretha Milch D, MD  aspirin 325 MG tablet Take 1 tablet (325 mg total) by mouth daily. 01/10/18  Yes Oretha Milch D, MD  cyanocobalamin (,VITAMIN B-12,) 1000 MCG/ML injection Inject 1 mL (1,000 mcg total) into the muscle every 30 (thirty) days. Reported on 10/31/2015 01/10/18   Yes Oretha Milch D, MD  docusate sodium (COLACE) 100 MG capsule Take 1 capsule (100 mg total) by mouth 2 (two) times daily. 01/10/18  Yes Oretha Milch D, MD  donepezil (ARICEPT) 10 MG tablet Take 1 tablet (10 mg total) by mouth at bedtime. 01/10/18  Yes Oretha Milch D, MD  LORazepam (ATIVAN) 1 MG tablet Take 1 mg by mouth every 6 (six) hours as needed for anxiety (confusion).   Yes [provider]  memantine (NAMENDA) 5 MG tablet Take 1 tablet (5 mg total) by mouth 2 (two) times daily. Patient taking differently: Take 10 mg by mouth 2 (two) times daily.  01/10/18  Yes Oretha Milch D, MD  metoprolol tartrate (LOPRESSOR) 25 MG tablet Take 0.5 tablets (12.5 mg total) by mouth 2 (two) times daily. Patient taking differently: Take 25 mg by mouth 2 (two) times daily.  01/10/18  Yes Desiree Hane, MD  Multiple Vitamins-Minerals (MULTIVITAMIN WITH MINERALS) tablet Take 1 tablet by mouth daily.   Yes [provider]     Allergies:     Allergies  Allergen Reactions  . Atorvastatin Other (See Comments)    Myalgia      Physical Exam:   Vitals  Blood pressure 135/71, pulse (!) 105, temperature 98 F (36.7 C), temperature source Oral, resp. rate (!) 21, height 5\' 4"  (1.626 m), weight 65.8 kg (145 lb), SpO2 100 %.   1. General  lying in bed in NAD,    2. Normal affect and insight, Not Suicidal or Homicidal, Awake Alert, Oriented X 2(person, place).  3. No F.N deficits, ALL C.Nerves Intact, Strength 5/5 all 4 extremities, Sensation intact all 4 extremities, Plantars down going.  4. Ears and Eyes appear Normal, Conjunctivae clear, PERRLA. Moist Oral Mucosa.  5. Supple Neck, No JVD, No cervical lymphadenopathy appriciated, No Carotid Bruits.  6. Symmetrical Chest wall movement, Good air movement bilaterally, CTAB.  7. RRR, No Gallops, Rubs or Murmurs, No Parasternal Heave.  8. Positive Bowel Sounds, Abdomen Soft, No tenderness, No organomegaly appriciated,No rebound  -guarding or rigidity.  9.  No Cyanosis, Normal Skin Turgor, No Skin Rash or Bruise.  10. Good muscle tone,  joints appear normal , no effusions, Normal ROM.  11. No Palpable Lymph Nodes in Neck or Axillae      Data Review:  CBC Recent Labs  Lab 02/14/18 1705  WBC 11.8*  HGB 13.9  HCT 43.6  PLT 345  MCV 93.6  MCH 29.8  MCHC 31.9  RDW 15.3   ------------------------------------------------------------------------------------------------------------------  Chemistries  Recent Labs  Lab 02/14/18 1551  NA 141  K 3.7  CL 107  CO2 24  GLUCOSE 117*  BUN 14  CREATININE 0.86  CALCIUM 9.0  AST 19  ALT 11  ALKPHOS 86  BILITOT 1.0   ------------------------------------------------------------------------------------------------------------------ estimated creatinine clearance is 38.9 mL/min (by C-G formula based on SCr of 0.86 mg/dL). ------------------------------------------------------------------------------------------------------------------ No results for input(s): TSH, T4TOTAL, T3FREE, THYROIDAB in the last 72 hours.  Invalid input(s): FREET3  Coagulation profile No results for input(s): INR, PROTIME in the last 168 hours. ------------------------------------------------------------------------------------------------------------------- No results for input(s): DDIMER in the last 72 hours. -------------------------------------------------------------------------------------------------------------------  Cardiac Enzymes No results for input(s): CKMB, TROPONINI, MYOGLOBIN in the last 168 hours.  Invalid input(s): CK ------------------------------------------------------------------------------------------------------------------ No results found for: BNP   --------------------------------------------------------------------------  Urinalysis    Component Value Date/Time   COLORURINE YELLOW 02/14/2018 1624   APPEARANCEUR CLEAR 02/14/2018 1624    LABSPEC 1.017 02/14/2018 1624   PHURINE 6.0 02/14/2018 1624   GLUCOSEU NEGATIVE 02/14/2018 1624   GLUCOSEU NEGATIVE 05/20/2013 0952   HGBUR NEGATIVE 02/14/2018 1624   BILIRUBINUR NEGATIVE 02/14/2018 1624   KETONESUR 5 (A) 02/14/2018 1624   PROTEINUR NEGATIVE 02/14/2018 1624   UROBILINOGEN 1.0 05/20/2013 0952   NITRITE NEGATIVE 02/14/2018 1624   LEUKOCYTESUR TRACE (A) 02/14/2018 1624    --------------------------------------------------------------------------   Imaging Results:    Ct Head Wo Contrast  Result Date: 02/14/2018 CLINICAL DATA:  Witnessed TIA. RIGHT-sided symptoms. Slurred speech. Altered mental status. EXAM: CT HEAD WITHOUT CONTRAST TECHNIQUE: Contiguous axial images were obtained from the base of the skull through the vertex without intravenous contrast. COMPARISON:  Head CT dated 02/15/2017. Brain MRI dated 02/16/2017. Head CT dated 01/04/2018. FINDINGS: Brain: Stable partially calcified LEFT paraclinoid meningioma. Mild chronic small vessel ischemic changes again noted within the bilateral periventricular and subcortical white matter regions. No parenchymal mass, hemorrhage or edema. No extra-axial hemorrhage. Vascular: There are chronic calcified atherosclerotic changes of the large vessels at the skull base. No unexpected hyperdense vessel. Skull: Normal. Negative for fracture or focal lesion. Sinuses/Orbits: No acute finding. Other: None. IMPRESSION: 1. No acute findings. 2. Stable appearance of the previously characterized LEFT paraclinoid meningioma. 3. Chronic small vessel ischemic changes within the white matter. Electronically Signed   By: Franki Cabot M.D.   On: 02/14/2018 19:56   Mr Brain Wo Contrast  Result Date: 02/14/2018 CLINICAL DATA:  Initial evaluation for acute right arm weakness. Slurred speech, altered mental status. EXAM: MRI HEAD WITHOUT CONTRAST TECHNIQUE: Multiplanar, multiecho pulse sequences of the brain and surrounding structures were obtained without  intravenous contrast. COMPARISON:  Prior CT from earlier the same day. FINDINGS: Brain: Study moderately degraded by motion artifact. Generalized age-related cerebral atrophy with moderate chronic small vessel ischemic disease. Patchy multifocal areas of restricted diffusion seen involving the posterior right basal ganglia (series 5, image 57). Additional patchy areas of infarction within the right periatrial white matter (series 5, image 54). These are acute to subacute in appearance. No associated mass effect or hemorrhage. No other evidence for acute or subacute ischemia. Gray-white matter differentiation otherwise maintained. No other areas of remote cortical infarction on this motion degraded exam. Small remote bilateral basal ganglia and left cerebellar infarcts noted. 2.8 x 2.3 cm left para clinoid meningioma again noted without significant mass  effect or edema. Possible additional 15 mm meningioma at the left occipital convexity (series 10, image 7). No other additional mass lesion. No midline shift or mass effect. No hydrocephalus. No extra-axial fluid collection. Vascular: Major intracranial vascular flow voids grossly maintained at the skull base Skull and upper cervical spine: Craniocervical junction grossly within normal limits. Bone marrow signal intensity normal. No scalp soft tissue abnormality. Sinuses/Orbits: Globes and orbital soft tissues within normal limits. Paranasal sinuses grossly clear. Trace left mastoid effusion, of doubtful significance. Other: None. IMPRESSION: 1. Patchy small volume acute ischemic nonhemorrhagic areas of acute to early subacute infarction involving the right basal ganglia and right periatrial white matter. 2. Moderate chronic small vessel ischemic disease with scattered remote infarcts as above. 3. 2.8 x 2.3 cm left paraclinoid meningioma without mass effect. Electronically Signed   By: Jeannine Boga M.D.   On: 02/14/2018 22:06   Dg Chest Port 1 View  Result  Date: 02/14/2018 CLINICAL DATA:  Altered mental status. EXAM: PORTABLE CHEST 1 VIEW COMPARISON:  Jan 04, 2018 FINDINGS: The heart size and mediastinal contours are within normal limits. Both lungs are clear. The visualized skeletal structures are unremarkable. IMPRESSION: No active disease. Electronically Signed   By: Dorise Bullion III M.D   On: 02/14/2018 15:57    ekg  ? afib at 11   Assessment & Plan:    Principal Problem:   Stroke [facility]) Active Problems:   Essential hypertension   Diabetes mellitus type 2 in nonobese (HCC)   PAF (paroxysmal atrial fibrillation) (HCC)   Acute lower UTI    Acute Stroke  Tele MRA brain Carotid ultrasound Cardiac echo Check hga1c, lipid PT/OT/ Speech therapy Please call neurology in AM to verify consulting Cont Asprin  Start plavix 75mg  po qday since aspirin failure (defer to neurology to continue or stop) Start Pravastatin 80mg  po qhs  Check CPK, MB for baseline purposes If has myalgia, try Zetia  Acute UTI Urine culture Start Rocephin 1gm iv qday  ? Pafib On prior discharge summary 02/20/2017 Repeat EKG  Hypertension Cont Metoprolol 12.5mg  po bid  Dm2 fsbs ac and qhs,  ISS  Dementia Cont Aricept 10mg  po qhs Cont Namenda 5mg  po bid  DVT Prophylaxis  - SCDs   AM Labs Ordered, also please review Full Orders  Family Communication: Admission, patients condition and plan of care including tests being ordered have been discussed with the patient  who indicate understanding and agree with the plan and Code Status.  I attempted to contact her brother Inetta Fermo who is listed as her POA on the nursing home sheet at [phone], this is out of service   Code Status  FULL CODE  Likely DC to  SNF  Condition GUARDED    Consults called:  Neurology by ED  Admission status:   inpatient  Time spent in minutes : 60 minutes   Jani Gravel M.D on 02/14/2018 at 10:44 PM  Between 7am to 7pm - Pager - [phone] . After 7pm go to  [email or web address] - password [facility]  [facility] - Office  [phone]

## 2018-02-14 NOTE — ED Notes (Signed)
MD Kim aware of pt passing swallow screen.

## 2018-02-14 NOTE — ED Notes (Signed)
[name] [place], [place]) [phone]

## 2018-02-14 NOTE — ED Triage Notes (Signed)
Pt from [facility] facility via [facility]; pt had witnessed TIA;   pt at baseline per EMS, confirmed by family; LSW 1300; at 68 staff observed R sided flaccidity ( R arm and R leg), slurred speech, increased AMS, when first ems arrived, noted r arm weakness, w/ in 1 minute the weakness had resolved; per ems, 5 min into transport pt became adgitated;pt given 2.5 haldol PTA; pt also c/o pain in R foot due to bunions; hx dementia  HR 100 99% RA CBG 128 RR 18 150/119

## 2018-02-14 NOTE — ED Notes (Signed)
Patient transported to MRI 

## 2018-02-14 NOTE — ED Notes (Signed)
purewick applied.

## 2018-02-14 NOTE — ED Provider Notes (Signed)
[facility] EMERGENCY DEPARTMENT Provider Note   CSN: [id number] Arrival date & time: 02/14/18  1459     History   Chief Complaint Chief Complaint  Patient presents with  . Transient Ischemic Attack    HPI Shelby Jennings is a 84 y.o. female.  84 year old female transferred here from [facility].  Per EMS reports patient was last known well at 1300 and at 1330 she was experiencing some right arm weakness.  Sounds like there was also some slurred speech and she was altered.  But the time EMS had gotten there her weakness had mostly resolved.  It sounds like she had gotten agitated during transfer and they gave her some Haldol.  Patient has a history of dementia and is apparently back to normal denying any complaints.  She also has no idea where she is and if anything happened earlier today.  Level 5 caveat secondary to dementia.  The history is provided by the patient and the EMS personnel. The history is limited by the condition of the patient.  Cerebrovascular Accident  This is a new problem. The current episode started 1 to 2 hours ago. The problem has been resolved. Nothing aggravates the symptoms. Nothing relieves the symptoms. She has tried nothing for the symptoms.    Past Medical History:  Diagnosis Date  . DIABETES MELLITUS, TYPE II 06/24/2009  . HYPERLIPIDEMIA 05/09/2007  . HYPERTENSION 05/09/2007  . Morbid obesity ([place])   . OSTEOARTHRITIS 05/09/2007  . Stroke [facility])     Patient Active Problem List   Diagnosis Date Noted  . Pubic ramus fracture, left, closed, initial encounter ([place]) 01/04/2018  . Elevated troponin 01/04/2018  . Menopausal state 09/10/2017  . Acute right MCA stroke ([place])   . Benign essential HTN   . Diabetes mellitus type 2 in nonobese ([facility])   . Hyperlipidemia   . PAF (paroxysmal atrial fibrillation) ([place])   . Dysphagia, post-stroke   . Acute respiratory failure with hypoxia ([place])   . Stroke [facility]) - Right middle cerebral artery due to  right M1 occlusion status post recanalization with mechanical embolectomy- etiology likely embolic - probably due to atrial fibrillation 02/15/2017  . Stroke with cerebral ischemia ([place]) 02/15/2017  . Memory deficit 06/11/2014  . Thyroid mass 03/23/2011  . Encounter for long-term (current) use of other medications 12/21/2010  . Routine general medical examination at a health care facility 12/21/2010  . KNEE PAIN, RIGHT 12/29/2009  . Diabetes ([place]) 06/24/2009  . PERS HX NONCOMPLIANCE W/MED TX PRS HAZARDS HLTH 06/24/2009  . UNSPECIFIED PRURITIC DISORDER 06/24/2007  . Dyslipidemia 05/09/2007  . Essential hypertension 05/09/2007  . DYSHIDROSIS 05/09/2007  . Osteoarthritis 05/09/2007    Past Surgical History:  Procedure Laterality Date  . IR PERCUTANEOUS ART THROMBECTOMY/INFUSION INTRACRANIAL INC DIAG ANGIO  02/15/2017  . IR US GUIDE VASC ACCESS RIGHT  02/15/2017  . RADIOLOGY WITH ANESTHESIA N/A 02/15/2017   Procedure: CODE STROKE;  Surgeon: Radiologist, Medication, MD;  Location: [place];  Service: Radiology;  Laterality: N/A;     OB History   None      Home Medications    Prior to Admission medications   Medication Sig Start Date End Date Taking? Authorizing Provider  acetaminophen (TYLENOL) 325 MG tablet Take 2 tablets (650 mg total) by mouth every 6 (six) hours as needed. 01/10/18   Oretha Milch D, MD  aspirin 325 MG tablet Take 1 tablet (325 mg total) by mouth daily. 01/10/18   Desiree Hane, MD  cyanocobalamin (,VITAMIN B-12,) 1000 MCG/ML injection Inject 1 mL (1,000 mcg total) into the muscle every 30 (thirty) days. Reported on 10/31/2015 01/10/18   Desiree Hane, MD  docusate sodium (COLACE) 100 MG capsule Take 1 capsule (100 mg total) by mouth 2 (two) times daily. 01/10/18   Oretha Milch D, MD  donepezil (ARICEPT) 10 MG tablet Take 1 tablet (10 mg total) by mouth at bedtime. 01/10/18   Desiree Hane, MD  memantine (NAMENDA) 5 MG tablet Take 1 tablet (5 mg total) by mouth 2  (two) times daily. 01/10/18   Oretha Milch D, MD  metoprolol tartrate (LOPRESSOR) 25 MG tablet Take 0.5 tablets (12.5 mg total) by mouth 2 (two) times daily. 01/10/18   Desiree Hane, MD    Family History Family History  Problem Relation Age of Onset  . Cancer Mother        had uncertain type of cancer    Social History Social History   Tobacco Use  . Smoking status: Never Smoker  . Smokeless tobacco: Never Used  Substance Use Topics  . Alcohol use: No  . Drug use: No     Allergies   Atorvastatin   Review of Systems Review of Systems  Unable to perform ROS: Dementia     Physical Exam Updated Vital Signs BP (!) 133/93 (BP Location: Left Arm)   Pulse 90   Temp 98 F (36.7 C) (Oral)   Resp (!) 21   Ht 5\' 4"  (1.626 m)   Wt 65.8 kg (145 lb)   SpO2 100%   BMI 24.89 kg/m   Physical Exam  Constitutional: She appears well-developed and well-nourished. No distress.  HENT:  Head: Normocephalic and atraumatic.  Eyes: Conjunctivae are normal.  Neck: Neck supple.  Cardiovascular: Normal rate, regular rhythm, normal heart sounds and intact distal pulses.  No murmur heard. Pulmonary/Chest: Effort normal and breath sounds normal. No respiratory distress.  Abdominal: Soft. There is no tenderness.  Musculoskeletal: Normal range of motion. She exhibits no edema, tenderness or deformity.  Neurological: She is alert. She has normal strength. She is disoriented (To time and place.). No cranial nerve deficit or sensory deficit. GCS eye subscore is 4. GCS verbal subscore is 5. GCS motor subscore is 6.  Skin: Skin is warm and dry.  Psychiatric: She has a normal mood and affect.  Nursing note and vitals reviewed.    ED Treatments / Results  Labs (all labs ordered are listed, but only abnormal results are displayed) Labs Reviewed  COMPREHENSIVE METABOLIC PANEL - Abnormal; Notable for the following components:      Result Value   Glucose, Bld 117 (*)    Albumin 3.2 (*)      GFR calc non Af Amer 57 (*)    All other components within normal limits  URINALYSIS, ROUTINE W REFLEX MICROSCOPIC - Abnormal; Notable for the following components:   Ketones, ur 5 (*)    Leukocytes, UA TRACE (*)    Bacteria, UA RARE (*)    All other components within normal limits  CBC - Abnormal; Notable for the following components:   WBC 11.8 (*)    All other components within normal limits  HEMOGLOBIN A1C - Abnormal; Notable for the following components:   Hgb A1c MFr Bld 6.5 (*)    All other components within normal limits  LIPID PANEL - Abnormal; Notable for the following components:   HDL 37 (*)    LDL Cholesterol 139 (*)  All other components within normal limits  GLUCOSE, CAPILLARY - Abnormal; Notable for the following components:   Glucose-Capillary 126 (*)    All other components within normal limits  URINE CULTURE  MRSA PCR SCREENING  CK TOTAL AND CKMB (NOT AT [facility])  CBG MONITORING, ED  I-STAT TROPONIN, ED    EKG EKG Interpretation  Date/Time:  Friday February 14 2018 15:07:18 EDT Ventricular Rate:  86 PR Interval:    QRS Duration: 86 QT Interval:  428 QTC Calculation: 512 R Axis:   11 Text Interpretation:  Sinus rhythm Multiple premature complexes, vent & supraven Borderline T abnormalities, anterior leads Prolonged QT interval new pvc compared with 5/19, nonspecific st/ts Confirmed by Aletta Edouard [phone]) on 02/14/2018 3:29:30 PM   Radiology Ct Head Wo Contrast  Result Date: 02/14/2018 CLINICAL DATA:  Witnessed TIA. RIGHT-sided symptoms. Slurred speech. Altered mental status. EXAM: CT HEAD WITHOUT CONTRAST TECHNIQUE: Contiguous axial images were obtained from the base of the skull through the vertex without intravenous contrast. COMPARISON:  Head CT dated 02/15/2017. Brain MRI dated 02/16/2017. Head CT dated 01/04/2018. FINDINGS: Brain: Stable partially calcified LEFT paraclinoid meningioma. Mild chronic small vessel ischemic changes again noted within the  bilateral periventricular and subcortical white matter regions. No parenchymal mass, hemorrhage or edema. No extra-axial hemorrhage. Vascular: There are chronic calcified atherosclerotic changes of the large vessels at the skull base. No unexpected hyperdense vessel. Skull: Normal. Negative for fracture or focal lesion. Sinuses/Orbits: No acute finding. Other: None. IMPRESSION: 1. No acute findings. 2. Stable appearance of the previously characterized LEFT paraclinoid meningioma. 3. Chronic small vessel ischemic changes within the white matter. Electronically Signed   By: Franki Cabot M.D.   On: 02/14/2018 19:56   Mr Brain Wo Contrast  Result Date: 02/14/2018 CLINICAL DATA:  Initial evaluation for acute right arm weakness. Slurred speech, altered mental status. EXAM: MRI HEAD WITHOUT CONTRAST TECHNIQUE: Multiplanar, multiecho pulse sequences of the brain and surrounding structures were obtained without intravenous contrast. COMPARISON:  Prior CT from earlier the same day. FINDINGS: Brain: Study moderately degraded by motion artifact. Generalized age-related cerebral atrophy with moderate chronic small vessel ischemic disease. Patchy multifocal areas of restricted diffusion seen involving the posterior right basal ganglia (series 5, image 57). Additional patchy areas of infarction within the right periatrial white matter (series 5, image 54). These are acute to subacute in appearance. No associated mass effect or hemorrhage. No other evidence for acute or subacute ischemia. Gray-white matter differentiation otherwise maintained. No other areas of remote cortical infarction on this motion degraded exam. Small remote bilateral basal ganglia and left cerebellar infarcts noted. 2.8 x 2.3 cm left para clinoid meningioma again noted without significant mass effect or edema. Possible additional 15 mm meningioma at the left occipital convexity (series 10, image 7). No other additional mass lesion. No midline shift or  mass effect. No hydrocephalus. No extra-axial fluid collection. Vascular: Major intracranial vascular flow voids grossly maintained at the skull base Skull and upper cervical spine: Craniocervical junction grossly within normal limits. Bone marrow signal intensity normal. No scalp soft tissue abnormality. Sinuses/Orbits: Globes and orbital soft tissues within normal limits. Paranasal sinuses grossly clear. Trace left mastoid effusion, of doubtful significance. Other: None. IMPRESSION: 1. Patchy small volume acute ischemic nonhemorrhagic areas of acute to early subacute infarction involving the right basal ganglia and right periatrial white matter. 2. Moderate chronic small vessel ischemic disease with scattered remote infarcts as above. 3. 2.8 x 2.3 cm left paraclinoid meningioma without mass effect.  Electronically Signed   By: Jeannine Boga M.D.   On: 02/14/2018 22:06   Dg Chest Port 1 View  Result Date: 02/14/2018 CLINICAL DATA:  Altered mental status. EXAM: PORTABLE CHEST 1 VIEW COMPARISON:  Jan 04, 2018 FINDINGS: The heart size and mediastinal contours are within normal limits. Both lungs are clear. The visualized skeletal structures are unremarkable. IMPRESSION: No active disease. Electronically Signed   By: Dorise Bullion III M.D   On: 02/14/2018 15:57   Mr Jodene Nam Head Wo Contrast  Result Date: 02/15/2018 CLINICAL DATA:  84 y/o  F; stroke for follow-up. EXAM: MRA HEAD WITHOUT CONTRAST TECHNIQUE: Angiographic images of the Circle of Willis were obtained using MRA technique without intravenous contrast. COMPARISON:  02/14/2018 MRI of the head. 02/15/2017 CT angiogram head FINDINGS: Internal carotid arteries:  Patent. Anterior cerebral arteries: Patent. Severe distal left A1 stenosis. Middle cerebral arteries: Patent. Stable left M2 superior division origin moderate stenosis. Anterior communicating artery: Patent. Posterior communicating arteries:  Patent. Posterior cerebral arteries:  Patent. Basilar  artery:  Patent. Vertebral arteries:  Patent. No evidence of high-grade stenosis, large vessel occlusion, or aneurysm unless noted above. IMPRESSION: 1. No large vessel occlusion or aneurysm. 2. Stable severe distal left A1 stenosis and moderate left M2 superior division stenosis. Electronically Signed   By: Kristine Garbe M.D.   On: 02/15/2018 01:50    Procedures Procedures (including critical care time)  Medications Ordered in ED Medications - No data to display   Initial Impression / Assessment and Plan / ED Course  I have reviewed the triage vital signs and the nursing notes.  Pertinent labs & imaging results that were available during my care of the patient were reviewed by me and considered in my medical decision making (see chart for details).  Clinical Course as of Feb 16 952  Fri Feb 14, 2018  1515 Discussed with Dr. Cheral Marker neurology who felt that CT would not be indicated and recommended just proceeding with an MRI.   [MB]  [place] Son is here now who states he was with her when this happened.  He states she did have stiffly some difficulty using her right arm and that seemed to improve over the course of 1/2-hour.  He feels she is back to baseline now and this is her typical mental status.   [MB]  2014 Is ready for CT but she is slightly agitated.  I have ordered her half milligram of Ativan.   [MB]  2241 Discussed with Dr. Lorraine Lax from neurology and Dr. Maudie Mercury from the hospitalist service who will evaluate her for admission.   [MB]    Clinical Course User Index [MB] Hayden Rasmussen, MD     Final Clinical Impressions(s) / ED Diagnoses   Final diagnoses:  Acute CVA (cerebrovascular accident) [facility])    ED Discharge Orders    None       Hayden Rasmussen, MD 02/15/18 [id number]

## 2018-02-15 ENCOUNTER — Other Ambulatory Visit: Payer: Self-pay

## 2018-02-15 ENCOUNTER — Inpatient Hospital Stay (HOSPITAL_COMMUNITY): Payer: Medicare Other

## 2018-02-15 DIAGNOSIS — I361 Nonrheumatic tricuspid (valve) insufficiency: Secondary | ICD-10-CM

## 2018-02-15 DIAGNOSIS — I6300 Cerebral infarction due to thrombosis of unspecified precerebral artery: Secondary | ICD-10-CM

## 2018-02-15 LAB — LIPID PANEL
Cholesterol: 191 mg/dL (ref 0–200)
HDL: 37 mg/dL — AB (ref >40)
LDL CALC: 139 mg/dL — AB (ref 0–99)
TRIGLYCERIDES: 77 mg/dL (ref <150)
Total CHOL/HDL Ratio: 5.2 RATIO
VLDL: 15 mg/dL (ref 0–40)

## 2018-02-15 LAB — GLUCOSE, CAPILLARY
GLUCOSE-CAPILLARY: 126 mg/dL — AB (ref 70–99)
GLUCOSE-CAPILLARY: 119 mg/dL — AB (ref 70–99)
Glucose-Capillary: 109 mg/dL — ABNORMAL HIGH (ref 70–99)
Glucose-Capillary: 150 mg/dL — ABNORMAL HIGH (ref 70–99)

## 2018-02-15 LAB — ECHOCARDIOGRAM COMPLETE
Height: 64 in
Weight: 2201.07 oz

## 2018-02-15 LAB — CK TOTAL AND CKMB (NOT AT ARMC)
CK, MB: 1.5 ng/mL (ref 0.5–5.0)
Relative Index: INVALID (ref 0.0–2.5)
Total CK: 51 U/L (ref 38–234)

## 2018-02-15 LAB — HEMOGLOBIN A1C
Hgb A1c MFr Bld: 6.5 % — ABNORMAL HIGH (ref 4.8–5.6)
Mean Plasma Glucose: 139.85 mg/dL

## 2018-02-15 MED ORDER — METOPROLOL TARTRATE 25 MG PO TABS
25.0000 mg | ORAL_TABLET | Freq: Two times a day (BID) | ORAL | Status: DC
  Administered 2018-02-15 – 2018-02-17 (×5): 25 mg via ORAL
  Filled 2018-02-15 (×5): qty 1

## 2018-02-15 MED ORDER — ADULT MULTIVITAMIN W/MINERALS CH
1.0000 | ORAL_TABLET | Freq: Every day | ORAL | Status: DC
  Administered 2018-02-15 – 2018-02-17 (×3): 1 via ORAL
  Filled 2018-02-15 (×3): qty 1

## 2018-02-15 MED ORDER — MEMANTINE HCL 10 MG PO TABS
10.0000 mg | ORAL_TABLET | Freq: Two times a day (BID) | ORAL | Status: DC
  Administered 2018-02-15 – 2018-02-17 (×5): 10 mg via ORAL
  Filled 2018-02-15 (×5): qty 1

## 2018-02-15 MED ORDER — DOCUSATE SODIUM 100 MG PO CAPS
100.0000 mg | ORAL_CAPSULE | Freq: Two times a day (BID) | ORAL | Status: DC
  Administered 2018-02-15 – 2018-02-17 (×5): 100 mg via ORAL
  Filled 2018-02-15 (×5): qty 1

## 2018-02-15 MED ORDER — DONEPEZIL HCL 10 MG PO TABS
10.0000 mg | ORAL_TABLET | Freq: Every day | ORAL | Status: DC
  Administered 2018-02-15 – 2018-02-16 (×2): 10 mg via ORAL
  Filled 2018-02-15 (×2): qty 1

## 2018-02-15 MED ORDER — LORAZEPAM 1 MG PO TABS
1.0000 mg | ORAL_TABLET | Freq: Four times a day (QID) | ORAL | Status: DC | PRN
  Administered 2018-02-15 – 2018-02-16 (×2): 1 mg via ORAL
  Filled 2018-02-15 (×2): qty 1

## 2018-02-15 NOTE — Progress Notes (Signed)
  Echocardiogram 2D Echocardiogram has been performed.  Shelby Jennings 02/15/2018, 10:46 AM

## 2018-02-15 NOTE — Progress Notes (Signed)
@IPLOG @        PROGRESS NOTE                                                                                                                                                                                                             Patient Demographics:    Shelby Jennings, is a 84 y.o. female, DOB - [birth year], ERX:[id number]  Admit date - 02/14/2018   Admitting Physician Jani Gravel, MD  Outpatient Primary MD for the patient is Renato Shin, MD  LOS - 1  Chief Complaint  Patient presents with  . Transient Ischemic Attack       Brief Narrative  84 y.o. female, w hypertension, hyperlipidemia, h/o stroke right temporal, right occiptial , right frontal, right basal ganglia,  (02/15/2017) , left paraclinoid meningioma, dementia  has c/o right upper ext weakeness starting about 11:30am, further work-up showed stroke.    Subjective:    Murvin Donning today has, No headache, No chest pain, No abdominal pain - No Nausea, No new weakness tingling or numbness, No Cough - SOB.  Unreliable historian.  Still has right-sided weakness mostly in the upper arm.   Assessment  & Plan :     1.  Basal ganglia and right periarterial white matter infarcts, 2.8 x 2.3 cm left-sided meningioma without mass-effect.  Seen by stroke team, currently on dual antiplatelet therapy, on full dose high intensity statin, stroke pathway being followed.  Weakness does not correspond with imaging.  2.  UTI.  On Rocephin follow cultures.  3.  DM type II.  6.5.  Currently on sliding scale.  CBG (last 3)  Recent Labs    02/15/18 0804 02/15/18 1146  GLUCAP 126* 150*    4.  Dementia.  Currently on combination of Aricept and Namenda.  At risk for delirium.  Minimize narcotics and benzodiazepines.  5.  Hypertension.  On low-dose beta-blocker for now.  6. ? PAF with a [place] vas 2 score of at least 5.  On dual antiplatelet therapy, chart from last admission reviewed, not on anticoagulation due to multiple falls and fall  risk.  Currently in sinus.  Will repeat EKG to confirm underlying rhythm.    Diet :  Diet Order           Diet heart healthy/carb modified Room service appropriate? Yes; Fluid consistency: Thin  Diet effective now          Family Communication  :  None present  Code Status :  Full  Disposition Plan  :  TBD  Consults  : Neuro  Procedures  :  Carotid. Dopplers.     TTE -  - Left ventricle: The cavity size was normal. Wall thickness was  normal. Systolic function was normal. The estimated ejection fraction was in the range of 60% to 65%. Wall motion was normal; there were no regional wall motion abnormalities. Doppler parameters are consistent with abnormal left ventricular  relaxation (grade 1 diastolic dysfunction). - Aortic valve: Trileaflet; mildly thickened, mildly calcified leaflets. There was no stenosis. - Mitral valve: Calcified annulus. - Atrial septum: No defect or patent foramen ovale was identified. - Tricuspid valve: There was mild regurgitation.   MRI-A  -  1. Patchy small volume acute ischemic nonhemorrhagic areas of acute to early subacute infarction involving the right basal ganglia and right periatrial white matter. 2. Moderate chronic small vessel ischemic disease with scattered remote infarcts as above. 3. 2.8 x 2.3 cm left paraclinoid meningioma without mass effect   1. No large vessel occlusion or aneurysm. 2. Stable severe distal left A1 stenosis and moderate left M2 superior division stenosis  DVT Prophylaxis  :  SCDs   Lab Results  Component Value Date   PLT 345 02/14/2018    Inpatient Medications  Scheduled Meds: .  stroke: mapping our early stages of recovery book   Does not apply Once  . aspirin  300 mg Rectal Daily   Or  . aspirin  325 mg Oral Daily  . clopidogrel  75 mg Oral Daily  . docusate sodium  100 mg Oral BID  . donepezil  10 mg Oral QHS  . insulin aspart  0-5 Units Subcutaneous QHS  . insulin aspart  0-9 Units Subcutaneous  TID WC  . memantine  10 mg Oral BID  . metoprolol tartrate  25 mg Oral BID  . multivitamin with minerals  1 tablet Oral Daily  . pravastatin  80 mg Oral q1800   Continuous Infusions: . cefTRIAXone (ROCEPHIN)  IV Stopped (02/15/18 0217)   PRN Meds:.acetaminophen **OR** acetaminophen (TYLENOL) oral liquid 160 mg/5 mL **OR** acetaminophen, LORazepam  Antibiotics  :    Anti-infectives (From admission, onward)   Start     Dose/Rate Route Frequency Ordered Stop   02/14/18 2345  cefTRIAXone (ROCEPHIN) 1 g in sodium chloride 0.9 % 100 mL IVPB     1 g 200 mL/hr over 30 Minutes Intravenous Every 24 hours 02/14/18 2339           Objective:   Vitals:   02/15/18 0150 02/15/18 0423 02/15/18 0800 02/15/18 1242  BP: (!) 155/91 (!) 141/74 (!) 120/56 120/60  Pulse:   89 86  Resp: (!) 37  (!) 26 (!) 24  Temp:  98.5 F (36.9 C) 98.3 F (36.8 C) 98.6 F (37 C)  TempSrc:  Oral Axillary Axillary  SpO2:      Weight:      Height:        Wt Readings from Last 3 Encounters:  02/15/18 62.4 kg (137 lb 9.1 oz)  01/04/18 68.5 kg (151 lb)  12/11/17 68.6 kg (151 lb 3.2 oz)     Intake/Output Summary (Last 24 hours) at 02/15/2018 1419 Last data filed at 02/15/2018 0300 Gross per 24 hour  Intake 100 ml  Output -  Net 100 ml     Physical Exam  Somnolent and weak,, No new F.N deficits, and right leg weakness, mild.  Normal affect [place].AT,PERRAL Supple Neck,No JVD, No cervical lymphadenopathy appriciated.  Symmetrical Chest wall movement,  Good air movement bilaterally, CTAB RRR,No Gallops,Rubs or new Murmurs, No Parasternal Heave +ve B.Sounds, Abd Soft, No tenderness, No organomegaly appriciated, No rebound - guarding or rigidity. No Cyanosis, Clubbing or edema, No new Rash or bruise       Data Review:    CBC Recent Labs  Lab 02/14/18 1705  WBC 11.8*  HGB 13.9  HCT 43.6  PLT 345  MCV 93.6  MCH 29.8  MCHC 31.9  RDW 15.3    Chemistries  Recent Labs  Lab 02/14/18 1551  NA 141   K 3.7  CL 107  CO2 24  GLUCOSE 117*  BUN 14  CREATININE 0.86  CALCIUM 9.0  AST 19  ALT 11  ALKPHOS 86  BILITOT 1.0   ------------------------------------------------------------------------------------------------------------------ Recent Labs    02/15/18 0659  CHOL 191  HDL 37*  LDLCALC 139*  TRIG 77  CHOLHDL 5.2    Lab Results  Component Value Date   HGBA1C 6.5 (H) 02/15/2018   ------------------------------------------------------------------------------------------------------------------ No results for input(s): TSH, T4TOTAL, T3FREE, THYROIDAB in the last 72 hours.  Invalid input(s): FREET3 ------------------------------------------------------------------------------------------------------------------ No results for input(s): VITAMINB12, FOLATE, FERRITIN, TIBC, IRON, RETICCTPCT in the last 72 hours.  Coagulation profile No results for input(s): INR, PROTIME in the last 168 hours.  No results for input(s): DDIMER in the last 72 hours.  Cardiac Enzymes Recent Labs  Lab 02/15/18 0036  CKMB 1.5   ------------------------------------------------------------------------------------------------------------------ No results found for: BNP  Micro Results No results found for this or any previous visit (from the past 240 hour(s)).  Radiology Reports Ct Head Wo Contrast  Result Date: 02/14/2018 CLINICAL DATA:  Witnessed TIA. RIGHT-sided symptoms. Slurred speech. Altered mental status. EXAM: CT HEAD WITHOUT CONTRAST TECHNIQUE: Contiguous axial images were obtained from the base of the skull through the vertex without intravenous contrast. COMPARISON:  Head CT dated 02/15/2017. Brain MRI dated 02/16/2017. Head CT dated 01/04/2018. FINDINGS: Brain: Stable partially calcified LEFT paraclinoid meningioma. Mild chronic small vessel ischemic changes again noted within the bilateral periventricular and subcortical white matter regions. No parenchymal mass, hemorrhage or  edema. No extra-axial hemorrhage. Vascular: There are chronic calcified atherosclerotic changes of the large vessels at the skull base. No unexpected hyperdense vessel. Skull: Normal. Negative for fracture or focal lesion. Sinuses/Orbits: No acute finding. Other: None. IMPRESSION: 1. No acute findings. 2. Stable appearance of the previously characterized LEFT paraclinoid meningioma. 3. Chronic small vessel ischemic changes within the white matter. Electronically Signed   By: Franki Cabot M.D.   On: 02/14/2018 19:56   Mr Brain Wo Contrast  Result Date: 02/14/2018 CLINICAL DATA:  Initial evaluation for acute right arm weakness. Slurred speech, altered mental status. EXAM: MRI HEAD WITHOUT CONTRAST TECHNIQUE: Multiplanar, multiecho pulse sequences of the brain and surrounding structures were obtained without intravenous contrast. COMPARISON:  Prior CT from earlier the same day. FINDINGS: Brain: Study moderately degraded by motion artifact. Generalized age-related cerebral atrophy with moderate chronic small vessel ischemic disease. Patchy multifocal areas of restricted diffusion seen involving the posterior right basal ganglia (series 5, image 57). Additional patchy areas of infarction within the right periatrial white matter (series 5, image 54). These are acute to subacute in appearance. No associated mass effect or hemorrhage. No other evidence for acute or subacute ischemia. Gray-white matter differentiation otherwise maintained. No other areas of remote cortical infarction on this motion degraded exam. Small remote bilateral basal ganglia and left cerebellar infarcts noted. 2.8 x 2.3 cm left para clinoid meningioma again noted without significant mass  effect or edema. Possible additional 15 mm meningioma at the left occipital convexity (series 10, image 7). No other additional mass lesion. No midline shift or mass effect. No hydrocephalus. No extra-axial fluid collection. Vascular: Major intracranial vascular  flow voids grossly maintained at the skull base Skull and upper cervical spine: Craniocervical junction grossly within normal limits. Bone marrow signal intensity normal. No scalp soft tissue abnormality. Sinuses/Orbits: Globes and orbital soft tissues within normal limits. Paranasal sinuses grossly clear. Trace left mastoid effusion, of doubtful significance. Other: None. IMPRESSION: 1. Patchy small volume acute ischemic nonhemorrhagic areas of acute to early subacute infarction involving the right basal ganglia and right periatrial white matter. 2. Moderate chronic small vessel ischemic disease with scattered remote infarcts as above. 3. 2.8 x 2.3 cm left paraclinoid meningioma without mass effect. Electronically Signed   By: Jeannine Boga M.D.   On: 02/14/2018 22:06   Dg Chest Port 1 View  Result Date: 02/14/2018 CLINICAL DATA:  Altered mental status. EXAM: PORTABLE CHEST 1 VIEW COMPARISON:  Jan 04, 2018 FINDINGS: The heart size and mediastinal contours are within normal limits. Both lungs are clear. The visualized skeletal structures are unremarkable. IMPRESSION: No active disease. Electronically Signed   By: Dorise Bullion III M.D   On: 02/14/2018 15:57   Mr Jodene Nam Head Wo Contrast  Result Date: 02/15/2018 CLINICAL DATA:  84 y/o  F; stroke for follow-up. EXAM: MRA HEAD WITHOUT CONTRAST TECHNIQUE: Angiographic images of the Circle of Willis were obtained using MRA technique without intravenous contrast. COMPARISON:  02/14/2018 MRI of the head. 02/15/2017 CT angiogram head FINDINGS: Internal carotid arteries:  Patent. Anterior cerebral arteries: Patent. Severe distal left A1 stenosis. Middle cerebral arteries: Patent. Stable left M2 superior division origin moderate stenosis. Anterior communicating artery: Patent. Posterior communicating arteries:  Patent. Posterior cerebral arteries:  Patent. Basilar artery:  Patent. Vertebral arteries:  Patent. No evidence of high-grade stenosis, large vessel  occlusion, or aneurysm unless noted above. IMPRESSION: 1. No large vessel occlusion or aneurysm. 2. Stable severe distal left A1 stenosis and moderate left M2 superior division stenosis. Electronically Signed   By: Kristine Garbe M.D.   On: 02/15/2018 01:50    Time Spent in minutes  30   Lala Lund M.D on 02/15/2018 at 2:19 PM  Between 7am to 7pm - Pager - [phone] ( page via [email or web address], text pages only, please mention full 10 digit call back number). After 7pm go to [email or web address] - password [facility]

## 2018-02-15 NOTE — Progress Notes (Signed)
OT Cancellation Note  Patient Details Name: Shelby Jennings MRN: [id number] DOB: [birth year]   Cancelled Treatment:    Reason Eval/Treat Not Completed: Patient at procedure or test/ unavailable. Pt out of room when OT attempted eval.  Benito Mccreedy OTR/L 02/15/2018, 10:55 AM

## 2018-02-15 NOTE — Progress Notes (Signed)
STROKE TEAM PROGRESS NOTE   HISTORY OF PRESENT ILLNESS (per record) Keslee Harrington is an 84 y.o. female with past medical history of dementia in a nursing home, hypertension hyperlipidemia, prior CVA presents to the ED after complaining of right upper extremity weakness.  Last known normal unclear.  Patient has dementia and is a poor historian and does not know why she is in the hospital. History was obtained by chart review.  From the ED notes, patient complained of right arm weakness around 1:30 PM CT head was done in the emergency room. Also it appears that nursing home staff noticed speech was slurred.  An MRI brain was obtained in the emergency room which showed patchy infarcts in the right basal ganglia.  She was admitted for stroke work-up.     SUBJECTIVE (INTERVAL HISTORY) Essentially unchanged neuro exam    OBJECTIVE Temp:  [98.3 F (36.8 C)-98.6 F (37 C)] 98.5 F (36.9 C) (07/06 1618) Pulse Rate:  [54-110] 110 (07/06 1618) Cardiac Rhythm: Sinus tachycardia (07/06 0700) Resp:  [19-37] 30 (07/06 1618) BP: (109-165)/(56-95) 136/62 (07/06 1618) SpO2:  [95 %-100 %] 100 % (07/06 1618) Weight:  [62.4 kg (137 lb 9.1 oz)] 62.4 kg (137 lb 9.1 oz) (07/06 0018)  CBC:  Recent Labs  Lab 02/14/18 1705  WBC 11.8*  HGB 13.9  HCT 43.6  MCV 93.6  PLT 315    Basic Metabolic Panel:  Recent Labs  Lab 02/14/18 1551  NA 141  K 3.7  CL 107  CO2 24  GLUCOSE 117*  BUN 14  CREATININE 0.86  CALCIUM 9.0    Lipid Panel:     Component Value Date/Time   CHOL 191 02/15/2018 0659   TRIG 77 02/15/2018 0659   HDL 37 (L) 02/15/2018 0659   CHOLHDL 5.2 02/15/2018 0659   VLDL 15 02/15/2018 0659   LDLCALC 139 (H) 02/15/2018 0659   HgbA1c:  Lab Results  Component Value Date   HGBA1C 6.5 (H) 02/15/2018   Urine Drug Screen: No results found for: LABOPIA, COCAINSCRNUR, LABBENZ, AMPHETMU, THCU, LABBARB  Alcohol Level No results found for: ETH  IMAGING   Ct Head Wo  Contrast 02/14/2018 IMPRESSION:  1. No acute findings.  2. Stable appearance of the previously characterized LEFT paraclinoid meningioma.  3. Chronic small vessel ischemic changes within the white matter.     Mr Brain Wo Contrast 02/14/2018 IMPRESSION:  1. Patchy small volume acute ischemic nonhemorrhagic areas of acute to early subacute infarction involving the right basal ganglia and right periatrial white matter.  2. Moderate chronic small vessel ischemic disease with scattered remote infarcts as above.  3. 2.8 x 2.3 cm left paraclinoid meningioma without mass effect.     Dg Chest Port 1 View 02/14/2018 IMPRESSION:  No active disease.     Mr Jodene Nam Head Wo Contrast 02/15/2018 IMPRESSION:  1. No large vessel occlusion or aneurysm.  2. Stable severe distal left A1 stenosis and moderate left M2 superior division stenosis.      Transthoracic Echocardiogram  02/15/2018 Study Conclusions  - Left ventricle: The cavity size was normal. Wall thickness was   normal. Systolic function was normal. The estimated ejection   fraction was in the range of 60% to 65%. Wall motion was normal;   there were no regional wall motion abnormalities. Doppler   parameters are consistent with abnormal left ventricular   relaxation (grade 1 diastolic dysfunction). - Aortic valve: Trileaflet; mildly thickened, mildly calcified   leaflets. There was no stenosis. -  Mitral valve: Calcified annulus. - Atrial septum: No defect or patent foramen ovale was identified. - Tricuspid valve: There was mild regurgitation.    Bilateral Carotid Dopplers  02/15/2018 Preliminary report:  1-39% ICA plaquing.  Vertebral artery flow is antegrade.      PHYSICAL EXAM Vitals:   02/15/18 0423 02/15/18 0800 02/15/18 1242 02/15/18 1618  BP: (!) 141/74 (!) 120/56 120/60 136/62  Pulse:  89 86 (!) 110  Resp:  (!) 26 (!) 24 (!) 30  Temp: 98.5 F (36.9 C) 98.3 F (36.8 C) 98.6 F (37 C) 98.5 F (36.9 C)  TempSrc: Oral  Axillary Axillary Oral  SpO2:    100%  Weight:      Height:            \       ASSESSMENT/PLAN Ms. Eliyanna Ault is a 84 y.o. female with history of advanced dementia, a thyroid nodule, previous strokes, obesity, hypertension, hyperlipidemia, diabetes mellitus, and B12 deficiency presenting with right upper extremity weakness. She did not receive IV t-PA due to unknown time of onset.  Stroke:  right basal ganglia and right periatrial white matter infarct - small vessel disease    CT head -no acute findings  MRI head - Patchy small volume acute ischemic nonhemorrhagic areas of acute to early subacute infarction involving the right basal ganglia and right periatrial white matter.  MRA head - Stable severe distal left A1 stenosis and moderate left M2 superior division stenosis.   Carotid Doppler -unremarkable  2D Echo -unremarkable  LDL 139  HgbA1c - 6.5  VTE prophylaxis - SCDs Diet Order           Diet heart healthy/carb modified Room service appropriate? Yes; Fluid consistency: Thin  Diet effective now          aspirin 325 mg daily prior to admission, now on aspirin 325 mg daily and clopidogrel 75 mg daily  Ongoing aggressive stroke risk factor management    Hypertension  Stable . Permissive hypertension (OK if < 220/120) but gradually normalize in 5-7 days . Long-term BP goal normotensive  Hyperlipidemia  Lipid lowering medication PTA:  none  LDL 139, goal < 70  Current lipid lowering medication: Pravastatin 80 mg daily  Continue statin at discharge  History of atorvastatin intolerance  Diabetes  HgbA1c 6.5, goal < 7.0  Controlled  Other Stroke Risk Factors  Advanced age  Hx stroke/TIA   Other Active Problems  Atorvastatin intolerance  Advanced dementia  Please call neurology with any questions  To contact Stroke Continuity provider, please refer to [email or web address] After hours, contact General Neurology

## 2018-02-15 NOTE — Evaluation (Signed)
Physical Therapy Evaluation Patient Details Name: Shelby Jennings MRN: [id number] DOB: 1924/02/18 Today's Date: 02/15/2018   History of Present Illness  Shelby Jennings is an 84 y.o. female with past medical history of dementia in a nursing home, hypertension hyperlipidemia, prior CVA presents to the ED after complaining of right upper extremity weakness. MRI + for basal ganglia stroke.    Clinical Impression  Pt admitted with above diagnosis. Pt currently with functional limitations due to the deficits listed below (see PT Problem List).  Pt will benefit from skilled PT to increase their independence and safety with mobility. Recommend return to SNF at DC.  Patient's biggest complaint was bilateral foot pain to touch or when attempting to weightbear.       Follow Up Recommendations SNF;Supervision/Assistance - 24 hour    Equipment Recommendations  None recommended by PT    Recommendations for Other Services       Precautions / Restrictions Precautions Precautions: Fall      Mobility  Bed Mobility Overal bed mobility: Needs Assistance Bed Mobility: Supine to Sit;Sit to Supine     Supine to sit: Supervision Sit to supine: Supervision   General bed mobility comments: Needed assist to scoot up in bed  Transfers Overall transfer level: Needs assistance Equipment used: None Transfers: Sit to/from Stand Sit to Stand: Max assist(unable to fully stand)         General transfer comment: with weight bearing pts feet were very painful and therefore she stopped effort to try to stand. Attempted twice.   Ambulation/Gait Ambulation/Gait assistance: (Unable to stand due to bilateral foot pain)              Stairs            Wheelchair Mobility    Modified Rankin (Stroke Patients Only) Modified Rankin (Stroke Patients Only) Pre-Morbid Rankin Score: Moderately severe disability Modified Rankin: Severe disability     Balance Overall balance assessment: (Able to sit EOB  independently)                                           Pertinent Vitals/Pain Pain Assessment: Faces  Pt wit c/o of severe pain in bilateral feet to touch and when attempting to bear weight.       Home Living Family/patient expects to be discharged to:: Skilled nursing facility                 Additional Comments: No family/caregivers present. Prior level of function unknown. Pt reports she was ambulatory,but not sure if that is accurate or not     Prior Function Level of Independence: Needs assistance               Hand Dominance   Dominant Hand: Right    Extremity/Trunk Assessment   Upper Extremity Assessment Upper Extremity Assessment: Defer to OT evaluation    Lower Extremity Assessment Lower Extremity Assessment: Generalized weakness       Communication   Communication: No difficulties  Cognition Arousal/Alertness: Awake/alert Behavior During Therapy: WFL for tasks assessed/performed Overall Cognitive Status: History of cognitive impairments - at baseline                                 General Comments: followed 1 step commands without difficulty      General Comments  General comments (skin integrity, edema, etc.): pt cooperative and participatory throughout session    Exercises     Assessment/Plan    PT Assessment Patient needs continued PT services  PT Problem List Decreased strength;Decreased activity tolerance;Decreased mobility;Pain       PT Treatment Interventions DME instruction;Gait training;Functional mobility training;Therapeutic activities;Therapeutic exercise;Balance training;Patient/family education    PT Goals (Current goals can be found in the Care Plan section)  Acute Rehab PT Goals Patient Stated Goal: Pt did not state goal when asked PT Goal Formulation: Patient unable to participate in goal setting Time For Goal Achievement: 02/22/18 Potential to Achieve Goals: Good    Frequency Min  2X/week   Barriers to discharge        Co-evaluation               AM-PAC PT "6 Clicks" Daily Activity  Outcome Measure Difficulty turning over in bed (including adjusting bedclothes, sheets and blankets)?: A Little Difficulty moving from lying on back to sitting on the side of the bed? : A Little Difficulty sitting down on and standing up from a chair with arms (e.g., wheelchair, bedside commode, etc,.)?: Unable Help needed moving to and from a bed to chair (including a wheelchair)?: Total Help needed walking in hospital room?: Total Help needed climbing 3-5 steps with a railing? : Total 6 Click Score: 10    End of Session Equipment Utilized During Treatment: Gait belt Activity Tolerance: Patient limited by pain Patient left: in bed;with bed alarm set;with call bell/phone within reach Nurse Communication: Mobility status;Other (comment)(Bilateral foot pain) PT Visit Diagnosis: Unsteadiness on feet (R26.81)    Time: 1779-3903 PT Time Calculation (min) (ACUTE ONLY): 30 min   Charges:   PT Evaluation $PT Eval Moderate Complexity: 1 Mod PT Treatments $Therapeutic Activity: 8-22 mins   PT G CodesLavonia Dana, PT  [phone] 02/15/2018   Melvern Banker 02/15/2018, 9:41 AM

## 2018-02-15 NOTE — Consult Note (Signed)
Requesting Physician: Dr. Maudie Mercury    Chief Complaint: R arm weakness  History obtained from: Patient and Chart    HPI:                                                                                                                                       Shelby Jennings is an 84 y.o. female with past medical history of dementia in a nursing home, hypertension hyperlipidemia, prior CVA presents to the ED after complaining of right upper extremity weakness.  Last known normal unclear.  Patient has dementia and is a poor historian and does not know why she is in the hospital. History was obtained by chart review.  From the ED notes, patient complained of right arm weakness around 1:30 PM CT head was done in the emergency room. Also it appears that nursing home staff noticed speech was slurred.  An MRI brain was obtained in the emergency room which showed patchy infarcts in the right basal ganglia.  She was admitted for stroke work-up.     Past Medical History:  Diagnosis Date  . B12 deficiency 07/09/2017   B12=138  . DIABETES MELLITUS, TYPE II 06/24/2009  . HYPERLIPIDEMIA 05/09/2007  . HYPERTENSION 05/09/2007  . Morbid obesity ([place])   . OSTEOARTHRITIS 05/09/2007  . Rhabdomyolysis 01/04/2018   CPK 882  . Stroke ([place])   . Stroke of right basal ganglia ([place]) 02/14/2018  . Thyroid nodule 05/29?2019   left thyroid (2.9cm) on CT chest    Past Surgical History:  Procedure Laterality Date  . IR PERCUTANEOUS ART THROMBECTOMY/INFUSION INTRACRANIAL INC DIAG ANGIO  02/15/2017  . IR US GUIDE VASC ACCESS RIGHT  02/15/2017  . RADIOLOGY WITH ANESTHESIA N/A 02/15/2017   Procedure: CODE STROKE;  Surgeon: Radiologist, Medication, MD;  Location: [place];  Service: Radiology;  Laterality: N/A;    Family History  Problem Relation Age of Onset  . Cancer Mother        had uncertain type of cancer   Social History:  reports that she has never smoked. She has never used smokeless tobacco. She reports that she does not  drink alcohol or use drugs.  Allergies:  Allergies  Allergen Reactions  . Atorvastatin Other (See Comments)    Myalgia     Medications:  I reviewed home medications   ROS:                                                                                                                                     Patient is a poor historian however denied any positive complaints when most systems were reviewed   Examination:                                                                                                      General: Appears well-developed and well-nourished.  Psych: Affect appropriate to situation Eyes: No scleral injection HENT: No OP obstrucion Head: Normocephalic.  Cardiovascular: Normal rate and regular rhythm.  Respiratory: Effort normal and breath sounds normal to anterior ascultation GI: Soft.  No distension. There is no tenderness.  Skin: WDI    Neurological Examination Mental Status: Alert, however not oriented to place or month.  Speech fluent without evidence of aphasia. Able to simple  commands without difficulty. Cranial Nerves: II: Visual fields grossly normal,  III,IV, VI: ptosis not present, extra-ocular motions intact bilaterally, pupils equal, round, reactive to light and accommodation V,VII: smile symmetric, facial light touch sensation normal bilaterally VIII: hearing normal bilaterally IX,X: uvula rises symmetrically XI: bilateral shoulder shrug XII: midline tongue extension Motor: Right : Upper extremity   4/5    Left:     Upper extremity   5/5  Lower extremity   5/5     Lower extremity   5/5 Tone and bulk:normal tone throughout; no atrophy noted Sensory: Pinprick and light touch intact throughout, bilaterally Deep Tendon Reflexes: Did not assess Plantars: Right: downgoing   Left: downgoing Cerebellar: normal  finger-to-nose, normal rapid alternating movements and normal heel-to-shin test Gait: Did not assess due to safety     Lab Results: Basic Metabolic Panel: Recent Labs  Lab 02/14/18 1551  NA 141  K 3.7  CL 107  CO2 24  GLUCOSE 117*  BUN 14  CREATININE 0.86  CALCIUM 9.0    CBC: Recent Labs  Lab 02/14/18 1705  WBC 11.8*  HGB 13.9  HCT 43.6  MCV 93.6  PLT 345    Coagulation Studies: No results for input(s): LABPROT, INR in the last 72 hours.  Imaging: Ct Head Wo Contrast  Result Date: 02/14/2018 CLINICAL DATA:  Witnessed TIA. RIGHT-sided symptoms. Slurred speech. Altered mental status. EXAM: CT HEAD WITHOUT CONTRAST TECHNIQUE: Contiguous axial images were obtained from the base of the skull through the vertex without intravenous contrast. COMPARISON:  Head  CT dated 02/15/2017. Brain MRI dated 02/16/2017. Head CT dated 01/04/2018. FINDINGS: Brain: Stable partially calcified LEFT paraclinoid meningioma. Mild chronic small vessel ischemic changes again noted within the bilateral periventricular and subcortical white matter regions. No parenchymal mass, hemorrhage or edema. No extra-axial hemorrhage. Vascular: There are chronic calcified atherosclerotic changes of the large vessels at the skull base. No unexpected hyperdense vessel. Skull: Normal. Negative for fracture or focal lesion. Sinuses/Orbits: No acute finding. Other: None. IMPRESSION: 1. No acute findings. 2. Stable appearance of the previously characterized LEFT paraclinoid meningioma. 3. Chronic small vessel ischemic changes within the white matter. Electronically Signed   By: Franki Cabot M.D.   On: 02/14/2018 19:56   Mr Brain Wo Contrast  Result Date: 02/14/2018 CLINICAL DATA:  Initial evaluation for acute right arm weakness. Slurred speech, altered mental status. EXAM: MRI HEAD WITHOUT CONTRAST TECHNIQUE: Multiplanar, multiecho pulse sequences of the brain and surrounding structures were obtained without intravenous  contrast. COMPARISON:  Prior CT from earlier the same day. FINDINGS: Brain: Study moderately degraded by motion artifact. Generalized age-related cerebral atrophy with moderate chronic small vessel ischemic disease. Patchy multifocal areas of restricted diffusion seen involving the posterior right basal ganglia (series 5, image 57). Additional patchy areas of infarction within the right periatrial white matter (series 5, image 54). These are acute to subacute in appearance. No associated mass effect or hemorrhage. No other evidence for acute or subacute ischemia. Gray-white matter differentiation otherwise maintained. No other areas of remote cortical infarction on this motion degraded exam. Small remote bilateral basal ganglia and left cerebellar infarcts noted. 2.8 x 2.3 cm left para clinoid meningioma again noted without significant mass effect or edema. Possible additional 15 mm meningioma at the left occipital convexity (series 10, image 7). No other additional mass lesion. No midline shift or mass effect. No hydrocephalus. No extra-axial fluid collection. Vascular: Major intracranial vascular flow voids grossly maintained at the skull base Skull and upper cervical spine: Craniocervical junction grossly within normal limits. Bone marrow signal intensity normal. No scalp soft tissue abnormality. Sinuses/Orbits: Globes and orbital soft tissues within normal limits. Paranasal sinuses grossly clear. Trace left mastoid effusion, of doubtful significance. Other: None. IMPRESSION: 1. Patchy small volume acute ischemic nonhemorrhagic areas of acute to early subacute infarction involving the right basal ganglia and right periatrial white matter. 2. Moderate chronic small vessel ischemic disease with scattered remote infarcts as above. 3. 2.8 x 2.3 cm left paraclinoid meningioma without mass effect. Electronically Signed   By: Jeannine Boga M.D.   On: 02/14/2018 22:06   Dg Chest Port 1 View  Result Date:  02/14/2018 CLINICAL DATA:  Altered mental status. EXAM: PORTABLE CHEST 1 VIEW COMPARISON:  Jan 04, 2018 FINDINGS: The heart size and mediastinal contours are within normal limits. Both lungs are clear. The visualized skeletal structures are unremarkable. IMPRESSION: No active disease. Electronically Signed   By: Dorise Bullion III M.D   On: 02/14/2018 15:57   Mr Jodene Nam Head Wo Contrast  Result Date: 02/15/2018 CLINICAL DATA:  84 y/o  F; stroke for follow-up. EXAM: MRA HEAD WITHOUT CONTRAST TECHNIQUE: Angiographic images of the Circle of Willis were obtained using MRA technique without intravenous contrast. COMPARISON:  02/14/2018 MRI of the head. 02/15/2017 CT angiogram head FINDINGS: Internal carotid arteries:  Patent. Anterior cerebral arteries: Patent. Severe distal left A1 stenosis. Middle cerebral arteries: Patent. Stable left M2 superior division origin moderate stenosis. Anterior communicating artery: Patent. Posterior communicating arteries:  Patent. Posterior cerebral arteries:  Patent. Basilar  artery:  Patent. Vertebral arteries:  Patent. No evidence of high-grade stenosis, large vessel occlusion, or aneurysm unless noted above. IMPRESSION: 1. No large vessel occlusion or aneurysm. 2. Stable severe distal left A1 stenosis and moderate left M2 superior division stenosis. Electronically Signed   By: Kristine Garbe M.D.   On: 02/15/2018 01:50     ASSESSMENT AND PLAN   84 year old female with dementia presents to ER with right arm weakness.  MRI brain shows subacute subcortical infarcts are likely to be incidental.  Patient is on aspirin.  Given patient's advanced dementia, recommend limited stroke work-up.  Subcute Ischemic Stroke   Recommend # DAPT started by Medicine team, would limit to no more than 3 weeks  #Start or continue Atorvastatin 80 mg/other high intensity statin # BP goal: normotension # Telemetry monitoring # Frequent neuro checks # NPO until passes stroke swallow  screen  Please page stroke NP  Or  PA  Or MD from 8am -4 pm  as this patient from this time will be  followed by the stroke.   You can look them up on [email or web address]  Password [facility]     Shelby Jennings [facility] Pager Number [phone]

## 2018-02-15 NOTE — ED Notes (Signed)
Admitting MD Kim at bedside.  

## 2018-02-15 NOTE — Progress Notes (Signed)
VASCULAR LAB PRELIMINARY  PRELIMINARY  PRELIMINARY  PRELIMINARY  Carotid duplex completed.    Preliminary report:  1-39% ICA plaquing.  Vertebral artery flow is antegrade.   Hannah Strader, RVT 02/15/2018, 11:07 AM

## 2018-02-16 DIAGNOSIS — I639 Cerebral infarction, unspecified: Principal | ICD-10-CM

## 2018-02-16 LAB — GLUCOSE, CAPILLARY
GLUCOSE-CAPILLARY: 132 mg/dL — AB (ref 70–99)
GLUCOSE-CAPILLARY: 100 mg/dL — AB (ref 70–99)
Glucose-Capillary: 110 mg/dL — ABNORMAL HIGH (ref 70–99)
Glucose-Capillary: 143 mg/dL — ABNORMAL HIGH (ref 70–99)
Glucose-Capillary: 113 mg/dL — ABNORMAL HIGH (ref 70–99)
Glucose-Capillary: 171 mg/dL — ABNORMAL HIGH (ref 70–99)

## 2018-02-16 LAB — URINE CULTURE: Culture: NO GROWTH

## 2018-02-16 MED ORDER — PANTOPRAZOLE SODIUM 40 MG PO TBEC
40.0000 mg | DELAYED_RELEASE_TABLET | Freq: Every day | ORAL | Status: AC
Start: 2018-02-16 — End: ?

## 2018-02-16 MED ORDER — CLOPIDOGREL BISULFATE 75 MG PO TABS: 75.0000 mg | ORAL_TABLET | Freq: Every day | ORAL | Status: AC

## 2018-02-16 MED ORDER — CEFTRIAXONE SODIUM 1 G IJ SOLR
1.0000 g | INTRAMUSCULAR | Status: AC
  Administered 2018-02-16: 1 g via INTRAVENOUS
  Filled 2018-02-16: qty 10

## 2018-02-16 MED ORDER — PRAVASTATIN SODIUM 80 MG PO TABS: 80.0000 mg | ORAL_TABLET | Freq: Every day | ORAL | Status: AC

## 2018-02-16 NOTE — Care Management Note (Signed)
Case Management Note  Patient Details  Name: Shelby Jennings MRN: [id number] Date of Birth: 10-05-1923  Subjective/Objective:                 Notified CSW of DC to SNF, from [facility]. No CM needs idenfied   Action/Plan:   Expected Discharge Date:  02/16/18               Expected Discharge Plan:  Skilled Nursing Facility  In-House Referral:  Clinical Social Work  Discharge planning Services     Post Acute Care Choice:    Choice offered to:     DME Arranged:    DME Agency:     HH Arranged:    [place] Agency:     Status of Service:  Completed, signed off  If discussed at [employer], dates discussed:    Additional Comments:  Carles Collet, RN 02/16/2018, 1:08 PM

## 2018-02-16 NOTE — Discharge Instructions (Signed)
Follow with Primary MD Renato Shin, MD in 7 days   Get CBC, CMP, CK, checked  by Primary MD or SNF MD in 5-7 days   Activity: As tolerated with Full fall precautions use walker/cane & assistance as needed  Disposition SNF  Diet:   Low Carb Heart Healthy with feeding assistance and aspiration precautions.  For Heart failure patients - Check your Weight same time everyday, if you gain over 2 pounds, or you develop in leg swelling, experience more shortness of breath or chest pain, call your Primary MD immediately. Follow Cardiac Low Salt Diet and 1.5 lit/day fluid restriction.  Special Instructions: If you have smoked or chewed Tobacco  in the last 2 yrs please stop smoking, stop any regular Alcohol  and or any Recreational drug use.  On your next visit with your primary care physician please Get Medicines reviewed and adjusted.  Please request your Prim.MD to go over all Hospital Tests and Procedure/Radiological results at the follow up, please get all Hospital records sent to your Prim MD by signing hospital release before you go home.  If you experience worsening of your admission symptoms, develop shortness of breath, life threatening emergency, suicidal or homicidal thoughts you must seek medical attention immediately by calling 911 or calling your MD immediately  if symptoms less severe.

## 2018-02-16 NOTE — NC FL2 (Signed)
[place] LEVEL OF CARE SCREENING TOOL     IDENTIFICATION  Patient Name: Shelby Jennings Birthdate: March 03, 1924 Sex: female Admission Date (Current Location): 02/14/2018  [facility] and [place] Number:  Herbalist and Address:  [facility], [place] [address] [place], [place] [address]      Provider Number: [id number]  Attending Physician Name and Address:  Thurnell Lose, MD  Relative Name and Phone Number:  [name], [phone]    Current Level of Care: Hospital Recommended Level of Care: [place] Prior Approval Number:    Date Approved/Denied:   PASRR Number:    Discharge Plan: SNF    Current Diagnoses: Patient Active Problem List   Diagnosis Date Noted  . Acute lower UTI 02/14/2018  . Pubic ramus fracture, left, closed, initial encounter ([place]) 01/04/2018  . Elevated troponin 01/04/2018  . Menopausal state 09/10/2017  . Acute right MCA stroke ([place])   . Benign essential HTN   . Diabetes mellitus type 2 in nonobese (H[facility])   . Hyperlipidemia   . PAF (paroxysmal atrial fibrillation) ([place])   . Dysphagia, post-stroke   . Acute respiratory failure with hypoxia ([place])   . Stroke ([place]) 02/15/2017  . Stroke with cerebral ischemia ([place]) 02/15/2017  . Memory deficit 06/11/2014  . Thyroid mass 03/23/2011  . Encounter for long-term (current) use of other medications 12/21/2010  . Routine general medical examination at a health care facility 12/21/2010  . KNEE PAIN, RIGHT 12/29/2009  . Diabetes ([place]) 06/24/2009  . PERS HX NONCOMPLIANCE W/MED TX PRS HAZARDS HLTH 06/24/2009  . UNSPECIFIED PRURITIC DISORDER 06/24/2007  . Dyslipidemia 05/09/2007  . Essential hypertension 05/09/2007  . DYSHIDROSIS 05/09/2007  . Osteoarthritis 05/09/2007    Orientation RESPIRATION BLADDER Height & Weight     Self  Normal Incontinent Weight: 137 lb 9.1 oz (62.4 kg) Height:  5\' 4"  (162.6 cm)  BEHAVIORAL SYMPTOMS/MOOD  NEUROLOGICAL BOWEL NUTRITION STATUS  (NA) (NA) Incontinent Diet(Low Carbohydrates heart healthy; feeding assistance and aspirations)  AMBULATORY STATUS COMMUNICATION OF NEEDS Skin   Limited Assist Verbally Normal                       Personal Care Assistance Level of Assistance  Bathing, Feeding, Dressing Bathing Assistance: Maximum assistance Feeding assistance: Limited assistance Dressing Assistance: Limited assistance     Functional Limitations Info  Sight Sight Info: Impaired        SPECIAL CARE FACTORS FREQUENCY          OT Frequency: (NA)            Contractures Contractures Info: Not present    Additional Factors Info  Code Status, Allergies Code Status Info: FULL Allergies Info: Atorvastatin           Current Medications (02/16/2018):  This is the current hospital active medication list Current Facility-Administered Medications  Medication Dose Route Frequency Provider Last Rate Last Dose  .  stroke: mapping our early stages of recovery book   Does not apply Once Jani Gravel, MD      . acetaminophen (TYLENOL) tablet 650 mg  650 mg Oral Q4H PRN Jani Gravel, MD       Or  . acetaminophen (TYLENOL) solution 650 mg  650 mg Per Tube Q4H PRN Jani Gravel, MD       Or  . acetaminophen (TYLENOL) suppository 650 mg  650 mg Rectal Q4H PRN Jani Gravel, MD   650 mg at  02/15/18 0451  . aspirin suppository 300 mg  300 mg Rectal Daily Jani Gravel, MD       Or  . aspirin tablet 325 mg  325 mg Oral Daily Jani Gravel, MD   325 mg at 02/16/18 1004  . cefTRIAXone (ROCEPHIN) 1 g in sodium chloride 0.9 % 100 mL IVPB  1 g Intravenous Q24H Thurnell Lose, MD      . clopidogrel (PLAVIX) tablet 75 mg  75 mg Oral Daily Jani Gravel, MD   75 mg at 02/16/18 1004  . docusate sodium (COLACE) capsule 100 mg  100 mg Oral BID Jani Gravel, MD   100 mg at 02/16/18 1004  . donepezil (ARICEPT) tablet 10 mg  10 mg Oral Loma Sousa, MD   10 mg at 02/15/18 2239  . insulin aspart (novoLOG)  injection 0-5 Units  0-5 Units Subcutaneous QHS Jani Gravel, MD      . insulin aspart (novoLOG) injection 0-9 Units  0-9 Units Subcutaneous TID WC Jani Gravel, MD   1 Units at 02/15/18 1707  . LORazepam (ATIVAN) tablet 1 mg  1 mg Oral Q6H PRN Jani Gravel, MD   1 mg at 02/15/18 2240  . memantine (NAMENDA) tablet 10 mg  10 mg Oral BID Jani Gravel, MD   10 mg at 02/16/18 1004  . metoprolol tartrate (LOPRESSOR) tablet 25 mg  25 mg Oral BID Jani Gravel, MD   25 mg at 02/16/18 1004  . multivitamin with minerals tablet 1 tablet  1 tablet Oral Daily Jani Gravel, MD   1 tablet at 02/16/18 1004  . pravastatin (PRAVACHOL) tablet 80 mg  80 mg Oral q1800 Jani Gravel, MD   80 mg at 02/15/18 1707     Discharge Medications: Please see discharge summary for a list of discharge medications.  Relevant Imaging Results:  Relevant Lab Results:   Additional Information SS# [id number]  Carolin Sicks, [place]

## 2018-02-16 NOTE — Evaluation (Signed)
Occupational Therapy Evaluation Patient Details Name: Shelby Jennings MRN: [id number] DOB: 1924-07-02 Today's Date: 02/16/2018    History of Present Illness Shelby Jennings is an 84 y.o. female with past medical history of dementia in a nursing home, hypertension hyperlipidemia, prior CVA presents to the ED after complaining of right upper extremity weakness. MRI + for basal ganglia stroke.     Clinical Impression   Per chart, pt living in "nursing home" PTA. She is limited in her ability to provide an accurate PLOF but does report that she was able to ambulate. Pt limited by B foot pain this session (R>L) but willing to participate with OT. She requires min assist for LB dressing/bathing tasks seated with lateral leans and total assistance with toileting tasks as standing proved too painful despite max-total assistance. Pt would benefit from continued OT services while admitted to improve independence and safety with ADL and functional mobility. Recommend follow-up with OT at SNF level.     Follow Up Recommendations  SNF;Supervision/Assistance - 24 hour    Equipment Recommendations       Recommendations for Other Services       Precautions / Restrictions Precautions Precautions: Fall Restrictions Weight Bearing Restrictions: No      Mobility Bed Mobility Overal bed mobility: Needs Assistance Bed Mobility: Supine to Sit;Sit to Supine     Supine to sit: Min assist Sit to supine: Min assist   General bed mobility comments: Assist to manage BLE back to bed and raise trunk when sitting up.   Transfers Overall transfer level: Needs assistance Equipment used: None Transfers: Sit to/from Stand Sit to Stand: Max assist         General transfer comment: Able to initiate standing and motivated. However, bilateral foot pain limiting her this session. She reports burning pain (R>L).    Balance Overall balance assessment: Needs assistance Sitting-balance support: No upper extremity  supported;Feet supported Sitting balance-Leahy Scale: Fair                                     ADL either performed or assessed with clinical judgement   ADL Overall ADL's : Needs assistance/impaired Eating/Feeding: Sitting;Supervision/ safety   Grooming: Min guard;Sitting   Upper Body Bathing: Min guard;Sitting   Lower Body Bathing: Minimal assistance;Sitting/lateral leans   Upper Body Dressing : Min guard;Sitting   Lower Body Dressing: Minimal assistance;Sitting/lateral leans     Toilet Transfer Details (indicate cue type and reason): Attempted to stand but limited by pain. Pt making multiple attempts.            General ADL Comments: Pt limited by bilateral foot pain.      Vision Baseline Vision/History: Wears glasses Wears Glasses: At all times Patient Visual Report: No change from baseline Additional Comments: Will continue to assess. Making eye-contact and tracking with therapist. Although when laying down, she closes her eyes regularly.      Perception     Praxis      Pertinent Vitals/Pain Pain Assessment: Faces     Hand Dominance Right   Extremity/Trunk Assessment Upper Extremity Assessment Upper Extremity Assessment: Generalized weakness   Lower Extremity Assessment Lower Extremity Assessment: Overall WFL for tasks assessed;RLE deficits/detail RLE Deficits / Details: Pain at big toe with any pressure on floor.        Communication Communication Communication: No difficulties   Cognition Arousal/Alertness: Awake/alert;Lethargic Behavior During Therapy: WFL for tasks assessed/performed Overall  Cognitive Status: History of cognitive impairments - at baseline                                 General Comments: Pt able to follow commands this session well.    General Comments  HR in A-fib 91-108 throughout session.     Exercises     Shoulder Instructions      Home Living Family/patient expects to be discharged  to:: Skilled nursing facility                                 Additional Comments: Pt not able to provide a reliable history but reports being ambulatory. Per chart, she was at [facility].       Prior Functioning/Environment Level of Independence: Needs assistance        Comments: Pt admitted from SNF. She reports walking without a device but not reliable historian. She also reports "I don't know" when asked where she lived.         OT Problem List: Decreased strength;Decreased activity tolerance;Impaired balance (sitting and/or standing);Decreased safety awareness;Decreased knowledge of use of DME or AE;Decreased knowledge of precautions;Pain;Decreased cognition;Decreased coordination      OT Treatment/Interventions:      OT Goals(Current goals can be found in the care plan section) Acute Rehab OT Goals Patient Stated Goal: To lay back down OT Goal Formulation: With patient Time For Goal Achievement: 03/02/18 Potential to Achieve Goals: Good ADL Goals Pt Will Perform Grooming: with modified independence;sitting Pt Will Perform Lower Body Dressing: with min guard assist;sit to/from stand Pt Will Transfer to Toilet: with min assist;stand pivot transfer;bedside commode Pt Will Perform Toileting - Clothing Manipulation and hygiene: with min assist;sit to/from stand  OT Frequency:     Barriers to D/C:            Co-evaluation              AM-PAC PT "6 Clicks" Daily Activity     Outcome Measure Help from another person eating meals?: A Little Help from another person taking care of personal grooming?: A Little Help from another person toileting, which includes using toliet, bedpan, or urinal?: Total Help from another person bathing (including washing, rinsing, drying)?: A Lot Help from another person to put on and taking off regular upper body clothing?: A Little Help from another person to put on and taking off regular lower body clothing?: A Little 6 Click  Score: 15   End of Session Equipment Utilized During Treatment: Gait belt Nurse Communication: Mobility status  Activity Tolerance: Patient tolerated treatment well Patient left: in bed;with call bell/phone within reach  OT Visit Diagnosis: Unsteadiness on feet (R26.81);Other abnormalities of gait and mobility (R26.89);Muscle weakness (generalized) (M62.81);Pain Pain - Right/Left: Right(bilateral) Pain - part of body: Ankle and joints of foot                Time: 1040-1058 OT Time Calculation (min): 18 min Charges:  OT General Charges $OT Visit: 1 Visit OT Evaluation $OT Eval Moderate Complexity: 1 Mod G-Codes:     Norman Herrlich, MS OTR/L  Pager: [place] A Shelby Jennings 02/16/2018, 12:28 PM

## 2018-02-16 NOTE — Progress Notes (Signed)
CSW spoke with [facility]) pt will need insurance auth before returing to facility.  Insurance Josem Kaufmann is pending.  Facility will continue to check for updates.  Reed Breech LCSWA [phone]

## 2018-02-16 NOTE — Evaluation (Signed)
Speech Language Pathology Evaluation Patient Details Name: Shelby Jennings MRN: [id number] DOB: 01/28/1924 Today's Date: 02/16/2018 Time: 5102-5852 SLP Time Calculation (min) (ACUTE ONLY): 18 min  Problem List:  Patient Active Problem List   Diagnosis Date Noted  . Acute lower UTI 02/14/2018  . Pubic ramus fracture, left, closed, initial encounter ([place]) 01/04/2018  . Elevated troponin 01/04/2018  . Menopausal state 09/10/2017  . Acute right MCA stroke ([place])   . Benign essential HTN   . Diabetes mellitus type 2 in nonobese ([facility])   . Hyperlipidemia   . PAF (paroxysmal atrial fibrillation) ([place])   . Dysphagia, post-stroke   . Acute respiratory failure with hypoxia ([place])   . Stroke ([place]) 02/15/2017  . Stroke with cerebral ischemia ([place]) 02/15/2017  . Memory deficit 06/11/2014  . Thyroid mass 03/23/2011  . Encounter for long-term (current) use of other medications 12/21/2010  . Routine general medical examination at a health care facility 12/21/2010  . KNEE PAIN, RIGHT 12/29/2009  . Diabetes ([place]) 06/24/2009  . PERS HX NONCOMPLIANCE W/MED TX PRS HAZARDS HLTH 06/24/2009  . UNSPECIFIED PRURITIC DISORDER 06/24/2007  . Dyslipidemia 05/09/2007  . Essential hypertension 05/09/2007  . DYSHIDROSIS 05/09/2007  . Osteoarthritis 05/09/2007   Past Medical History:  Past Medical History:  Diagnosis Date  . B12 deficiency 07/09/2017   B12=138  . DIABETES MELLITUS, TYPE II 06/24/2009  . HYPERLIPIDEMIA 05/09/2007  . HYPERTENSION 05/09/2007  . Morbid obesity ([place])   . OSTEOARTHRITIS 05/09/2007  . Rhabdomyolysis 01/04/2018   CPK 882  . Stroke ([place])   . Stroke of right basal ganglia ([place]) 02/14/2018  . Thyroid nodule 05/29?2019   left thyroid (2.9cm) on CT chest   Past Surgical History:  Past Surgical History:  Procedure Laterality Date  . IR PERCUTANEOUS ART THROMBECTOMY/INFUSION INTRACRANIAL INC DIAG ANGIO  02/15/2017  . IR US GUIDE VASC ACCESS RIGHT  02/15/2017  . RADIOLOGY WITH ANESTHESIA  N/A 02/15/2017   Procedure: CODE STROKE;  Surgeon: Radiologist, Medication, MD;  Location: [facility];  Service: Radiology;  Laterality: N/A;   HPI:  Shelby Jennings is an 84 y.o. female with past medical history of dementia in a nursing home, hypertension hyperlipidemia, prior CVA presents to the ED after complaining of right upper extremity weakness. MRI + for basal ganglia stroke. Per charting pt passed stroke swallow screen, however upon SLP review pt has history of dysphagia, aspiration of thin liquids. Had repeat OP MBS 03/13/17 with recommendation for dys 3, thin liquids with chin tuck.    Assessment / Plan / Recommendation Clinical Impression   Patient presents with cognitive communication impairments, with decreased sustained attention, significant memory deficits (long and short term) as well as poor recall of verbal information. Unable to recall location/situation with delay despite SLP reorienting several times. Voice is low intensity, documented during prior assessment. She is able to follow simple verbal commands. I recommend SLP follow-up in SNF for above cognitive linguistic impairments. She would also benefit from SLP swallow evaluation due to history of dysphagia in the context of altered mentation and acute CVA. D/w RN.      SLP Assessment  SLP Recommendation/Assessment: All further Speech Lanaguage Pathology  needs can be addressed in the next venue of care SLP Visit Diagnosis: Cognitive communication deficit (R41.841)    Follow Up Recommendations  Skilled Nursing facility    Frequency and Duration           SLP Evaluation Cognition  Overall Cognitive Status: History of cognitive impairments - at baseline Arousal/Alertness:  Awake/alert Orientation Level: Oriented to person;Disoriented to place;Disoriented to time;Disoriented to situation Attention: Sustained Sustained Attention: Impaired Sustained Attention Impairment: Verbal basic Memory: Impaired Memory Impairment: Storage  deficit;Decreased recall of new information;Decreased long term memory;Decreased short term memory;Retrieval deficit Decreased Long Term Memory: Functional basic(pt unable to recall where she grew up) Decreased Short Term Memory: Verbal basic;Functional basic Problem Solving: Impaired Problem Solving Impairment: Functional basic Behaviors: Restless Safety/Judgment: Impaired       Comprehension  Auditory Comprehension Overall Auditory Comprehension: Appears within functional limits for tasks assessed Yes/No Questions: Within Functional Limits Commands: Within Functional Limits(simple commands) Conversation: Simple Visual Recognition/Discrimination Discrimination: Not tested Reading Comprehension Reading Status: Not tested    Expression Expression Primary Mode of Expression: Verbal Verbal Expression Overall Verbal Expression: Appears within functional limits for tasks assessed Level of Generative/Spontaneous Verbalization: Financial controller Expression Dominant Hand: Right Written Expression: Not tested   Oral / Motor  Oral Motor/Sensory Function Overall Oral Motor/Sensory Function: Within functional limits Motor Speech Overall Motor Speech: Impaired at baseline Respiration: Within functional limits Phonation: Low vocal intensity Resonance: Within functional limits Articulation: Impaired Level of Impairment: Conversation Intelligibility: Intelligible   GO                   Deneise Lever, [place], CCC-SLP Speech-Language Pathologist [phone]  Aliene Altes 02/16/2018, 4:25 PM

## 2018-02-16 NOTE — Clinical Social Work Note (Signed)
Clinical Social Work Assessment  Patient Details  Name: Shelby Jennings MRN: [id number] Date of Birth: [birth year]  Date of referral:  02/16/18               Reason for consult:  Facility Placement                Permission sought to share information with:  Facility Sport and exercise psychologist, Family Supports Permission granted to share information::  Yes, Verbal Permission Granted([place], [place])  Name::     Shelby Jennings  Agency::  yes  Relationship::  Nephew  Contact Information:  yes  Housing/Transportation Living arrangements for the past 2 months:  [place] of Information:  Medical laboratory scientific officer Patient Interpreter Needed:  None Criminal Activity/Legal Involvement Pertinent to Current Situation/Hospitalization:  No - Comment as needed Significant Relationships:  Other Family Members Lives with:  Facility Resident Do you feel safe going back to the place where you live?  Yes Need for family participation in patient care:  Yes (Comment)  Care giving concerns:  CSW received consult regarding SNF placement.  CSW spoke with patient's nephew Shelby Jennings [facility]). Patient resides at [facility].  Patient's nephew would like for patient to reside at SNF until stronger and then move patient to a long term facility.   Social Worker assessment / plan:  CSW spoke with patient's nephew (POA) Shelby Jennings regarding SNF placement.  Shelby Jennings [facility]) is agreeable for placement of patient at [facility]..  Employment status:  Retired Nurse, adult PT Recommendations:  Not assessed at this time(OT recommends SNF placement) Information / Referral to community resources:  (NA)  Patient/Family's Response to care:  Patient's nephew (POA) reports agreement with discharge plan to return to [facility].  Patient/Family's Understanding of and Emotional Response to Diagnosis, Current Treatment, and Prognosis:  Patient's family expressed being  hopeful for SNF placement.  Patient's nephew expressed understanding of CSW role and discharge process as well as medical condition.  No questions or concerns about plan or treatment.  Emotional Assessment Appearance:  Appears stated age Attitude/Demeanor/Rapport:  Unable to Assess Affect (typically observed):  Unable to Assess Orientation:  Oriented to Self Alcohol / Substance use:  Not Applicable Psych involvement (Current and /or in the community):  No (Comment)  Discharge Needs  Concerns to be addressed:  Discharge Planning Concerns, Cognitive Concerns Readmission within the last 30 days:  No Current discharge risk:  Cognitively Impaired Barriers to Discharge:  Continued Medical Work up, [place], Latanya Presser 02/16/2018, 1:33 PM

## 2018-02-16 NOTE — Discharge Summary (Addendum)
Addendum                                                            [facility]                                                                                                                                                                         Patient Demographics  Shelby Jennings, is a 84 y.o. female  QPR:[id number]  LDJ:[id number]  DOB - Mar 31, 1924  Admit date - 02/14/2018  Admitting Physician Jani Gravel, MD  Outpatient Primary MD for the patient is Renato Shin, MD  LOS - 3   Chief Complaint  Patient presents with  . Transient Ischemic Attack        Assessment & Plan    Patient seen briefly today due for discharge soon per Discharge done yesterday by me no further issues, Vital signs stable, patient feels fine.    Time Spent in minutes   10 minutes   Lala Lund M.D on 02/17/2018 at 12:22 PM  Between 7am to 7pm - Pager - [phone]  After 7pm go to [email or web address] - password [email or web address]  And look for the night coverage person covering for me after hours  [facility] Office  [phone]    Subjective:   Shelby Jennings today has, No headache, No chest pain, No abdominal pain - No Nausea, No new weakness tingling or numbness, No Cough - SOB.   Objective:   Vitals:   02/16/18 1930 02/16/18 2345 02/17/18 0327 02/17/18 0810  BP: (!) 150/62 130/70 137/67 125/70  Pulse: 83 84 75 83  Resp: 20 18 18 16   Temp: 98.3 F (36.8 C) 99.8 F (37.7 C) 98.9 F (37.2 C) 98.4 F (36.9 C)  TempSrc: Oral Oral Oral Oral  SpO2: 95% 97% 99% 98%  Weight:      Height:        Wt Readings from Last 3 Encounters:  02/15/18 62.4 kg (137 lb 9.1 oz)  01/04/18 68.5 kg (151 lb)  12/11/17 68.6 kg (151 lb 3.2 oz)     Intake/Output Summary (Last 24 hours) at 02/17/2018 1222 Last data filed at 02/16/2018 1628 Gross per 24 hour  Intake -  Output 300 ml    Net -300 ml    Exam Awake,  No new F.N deficits, Normal affect [place].AT,PERRAL Supple Neck,No JVD, No cervical lymphadenopathy appriciated.  Symmetrical Chest wall movement, Good air movement bilaterally, CTAB RRR,No Gallops,Rubs or  new Murmurs, No Parasternal Heave +ve B.Sounds, Abd Soft, Non tender, No organomegaly appriciated, No rebound - guarding or rigidity. No Cyanosis, Clubbing or edema, No new Rash or bruise      Data Review      DC Note                                                                            Shelby Jennings XFG:[id number] DOB: Jun 05, 1924 DOA: 02/14/2018  PCP: Renato Shin, MD  Admit date: 02/14/2018  Discharge date: 02/16/2018  Admitted From: SNF   Disposition:  SNF   Recommendations for Outpatient Follow-up:   Follow up with PCP in 1-2 weeks  PCP Please obtain BMP/CBC, 2 view CXR in 1week,  (see Discharge instructions)   PCP Please follow up on the following pending results: Needs neurology follow-up within the next 2 weeks   Home Health: None  Equipment/Devices: None  Consultations: Neuro Discharge Condition: Stable   CODE STATUS: Full   Diet Recommendation: Low carbohydrate heart healthy diet with feeding assistance and aspiration precautions.      Chief Complaint  Patient presents with  . Transient Ischemic Attack     Brief history of present illness from the day of admission and additional interim summary    84 y.o.female,w hypertension, hyperlipidemia, h/o stroke right temporal, right occiptial , right frontal, right basal ganglia, (02/15/2017) , left paraclinoid meningioma, dementia has c/o right upper ext weakeness starting about 11:30am, further work-up showed stroke.                                                                 Hospital Course   1.  Basal ganglia and right periarterial white matter infarcts, 2.8 x 2.3 cm left-sided meningioma without mass-effect.  Seen by stroke team, currently on dual antiplatelet therapy, on  full dose high intensity statin, stroke pathway was followed.  Weakness does not correspond with imaging.  He is demented and now close to her baseline, able to tolerate oral diet, placed on dual antiplatelet therapy for 3 weeks thereafter Plavix and statin only, her LDL was above goal, A1c was 6.5, echo and carotid duplex were nonacute.  She has recovered well and close to baseline.  Recommend outpatient neuro follow-up within 2 weeks of discharge.  Note after 3 weeks stop aspirin continue Plavix and statin.  2.  UTI.    Treated with Rocephin back to baseline.  3.  DM type II.    A1c was 6.5, diet controlled.  Continue  4.  Dementia.  Currently on combination of Aricept and Namenda.  At risk for delirium.  Minimize narcotics and benzodiazepines.  5.  Hypertension.  On low-dose beta-blocker for now.  6. ? PAF with a [place] vas 2 score of at least 5.  On dual antiplatelet therapy, chart from last admission reviewed, not on anticoagulation due to multiple falls and fall risk.    In sinus this admission and no concrete prove that she was in A. fib  last admission either, mentioned in her note, EEG to confirm from last admission either, no previous mention of the same.  It was decided last admission when she was admitted for a fall and pelvic fracture that she will not be anticoagulated for questionable A. fib due to fall risk.  I concur with that.  Plus I do not see a concrete proof of her being in atrial fibrillation.  7.  Dyslipidemia.  Placed on Crestor.  Monitor CK levels if needed.     Discharge diagnosis     Principal Problem:   Stroke [facility]) Active Problems:   Essential hypertension   Diabetes mellitus type 2 in nonobese (HCC)   PAF (paroxysmal atrial fibrillation) ([place])   Acute lower UTI    Discharge instructions    Discharge Instructions    Discharge instructions   Complete by:  As directed    Follow with Primary MD Renato Shin, MD in 7 days   Get CBC, CMP, CK, checked   by Primary MD or SNF MD in 5-7 days   Activity: As tolerated with Full fall precautions use walker/cane & assistance as needed  Disposition SNF  Diet:   Low Carb Heart Healthy with feeding assistance and aspiration precautions.  For Heart failure patients - Check your Weight same time everyday, if you gain over 2 pounds, or you develop in leg swelling, experience more shortness of breath or chest pain, call your Primary MD immediately. Follow Cardiac Low Salt Diet and 1.5 lit/day fluid restriction.  Special Instructions: If you have smoked or chewed Tobacco  in the last 2 yrs please stop smoking, stop any regular Alcohol  and or any Recreational drug use.  On your next visit with your primary care physician please Get Medicines reviewed and adjusted.  Please request your Prim.MD to go over all Hospital Tests and Procedure/Radiological results at the follow up, please get all Hospital records sent to your Prim MD by signing hospital release before you go home.  If you experience worsening of your admission symptoms, develop shortness of breath, life threatening emergency, suicidal or homicidal thoughts you must seek medical attention immediately by calling 911 or calling your MD immediately  if symptoms less severe.   Increase activity slowly   Complete by:  As directed       Discharge Medications   Allergies as of 02/16/2018      Reactions   Atorvastatin Other (See Comments)   Myalgia      Medication List    TAKE these medications   acetaminophen 325 MG tablet Commonly known as:  TYLENOL Take 2 tablets (650 mg total) by mouth every 6 (six) hours as needed.   aspirin 325 MG tablet Take 1 tablet (325 mg total) by mouth daily.   clopidogrel 75 MG tablet Commonly known as:  PLAVIX Take 1 tablet (75 mg total) by mouth daily.   cyanocobalamin 1000 MCG/ML injection Commonly known as:  (VITAMIN B-12) Inject 1 mL (1,000 mcg total) into the muscle every 30 (thirty) days. Reported on  10/31/2015   docusate sodium 100 MG capsule Commonly known as:  COLACE Take 1 capsule (100 mg total) by mouth 2 (two) times daily.   donepezil 10 MG tablet Commonly known as:  ARICEPT Take 1 tablet (10 mg total) by mouth at bedtime.   LORazepam 1 MG tablet Commonly known as:  ATIVAN Take 1 mg by mouth every 6 (six) hours as needed for anxiety (confusion).   memantine 5 MG tablet Commonly  known as:  NAMENDA Take 1 tablet (5 mg total) by mouth 2 (two) times daily. What changed:  how much to take   metoprolol tartrate 25 MG tablet Commonly known as:  LOPRESSOR Take 0.5 tablets (12.5 mg total) by mouth 2 (two) times daily. What changed:  how much to take   multivitamin with minerals tablet Take 1 tablet by mouth daily.   pantoprazole 40 MG tablet Commonly known as:  PROTONIX Take 1 tablet (40 mg total) by mouth daily.   pravastatin 80 MG tablet Commonly known as:  PRAVACHOL Take 1 tablet (80 mg total) by mouth daily at 6 PM.       Follow-up Information    Renato Shin, MD. Schedule an appointment as soon as possible for a visit in 1 week(s).   Specialty:  Endocrinology Contact information: [address] [place] [address] [phone]        Garvin Fila, MD. Schedule an appointment as soon as possible for a visit in 2 week(s).   Specialties:  Neurology, Radiology Why:  [facility] Contact information: [address] [place] [address] [phone]           Major procedures and Radiology Reports - PLEASE review detailed and final reports thoroughly  -      Carotid. Dopplers.  Preliminary report:  1-39% ICA plaquing.  Vertebral artery flow is antegrade.    TTE -  - Left ventricle: The cavity size was normal. Wall thickness was normal. Systolic function was normal. The estimated ejection fraction was in the range of 60% to 65%. Wall motion was normal; there were no regional wall motion abnormalities. Doppler parameters  are consistent with abnormal left ventricular relaxation (grade 1 diastolic dysfunction). - Aortic valve: Trileaflet; mildly thickened, mildly calcified leaflets. There was no stenosis. - Mitral valve: Calcified annulus. - Atrial septum: No defect or patent foramen ovale was identified. - Tricuspid valve: There was mild regurgitation.   MRI-A  -  1. Patchy small volume acute ischemic nonhemorrhagic areas of acute to early subacute infarction involving the right basal ganglia and right periatrial white matter. 2. Moderate chronic small vessel ischemic disease with scattered remote infarcts as above. 3. 2.8 x 2.3 cm left paraclinoid meningioma without mass effect   1. No large vessel occlusion or aneurysm. 2. Stable severe distal left A1 stenosis and moderate left M2 superior division stenosis     Ct Head Wo Contrast  Result Date: 02/14/2018 CLINICAL DATA:  Witnessed TIA. RIGHT-sided symptoms. Slurred speech. Altered mental status. EXAM: CT HEAD WITHOUT CONTRAST TECHNIQUE: Contiguous axial images were obtained from the base of the skull through the vertex without intravenous contrast. COMPARISON:  Head CT dated 02/15/2017. Brain MRI dated 02/16/2017. Head CT dated 01/04/2018. FINDINGS: Brain: Stable partially calcified LEFT paraclinoid meningioma. Mild chronic small vessel ischemic changes again noted within the bilateral periventricular and subcortical white matter regions. No parenchymal mass, hemorrhage or edema. No extra-axial hemorrhage. Vascular: There are chronic calcified atherosclerotic changes of the large vessels at the skull base. No unexpected hyperdense vessel. Skull: Normal. Negative for fracture or focal lesion. Sinuses/Orbits: No acute finding. Other: None. IMPRESSION: 1. No acute findings. 2. Stable appearance of the previously characterized LEFT paraclinoid meningioma. 3. Chronic small vessel ischemic changes within the white matter. Electronically Signed   By: Franki Cabot  M.D.   On: 02/14/2018 19:56   Mr Brain Wo Contrast  Result Date: 02/14/2018 CLINICAL DATA:  Initial evaluation for acute right  arm weakness. Slurred speech, altered mental status. EXAM: MRI HEAD WITHOUT CONTRAST TECHNIQUE: Multiplanar, multiecho pulse sequences of the brain and surrounding structures were obtained without intravenous contrast. COMPARISON:  Prior CT from earlier the same day. FINDINGS: Brain: Study moderately degraded by motion artifact. Generalized age-related cerebral atrophy with moderate chronic small vessel ischemic disease. Patchy multifocal areas of restricted diffusion seen involving the posterior right basal ganglia (series 5, image 57). Additional patchy areas of infarction within the right periatrial white matter (series 5, image 54). These are acute to subacute in appearance. No associated mass effect or hemorrhage. No other evidence for acute or subacute ischemia. Gray-white matter differentiation otherwise maintained. No other areas of remote cortical infarction on this motion degraded exam. Small remote bilateral basal ganglia and left cerebellar infarcts noted. 2.8 x 2.3 cm left para clinoid meningioma again noted without significant mass effect or edema. Possible additional 15 mm meningioma at the left occipital convexity (series 10, image 7). No other additional mass lesion. No midline shift or mass effect. No hydrocephalus. No extra-axial fluid collection. Vascular: Major intracranial vascular flow voids grossly maintained at the skull base Skull and upper cervical spine: Craniocervical junction grossly within normal limits. Bone marrow signal intensity normal. No scalp soft tissue abnormality. Sinuses/Orbits: Globes and orbital soft tissues within normal limits. Paranasal sinuses grossly clear. Trace left mastoid effusion, of doubtful significance. Other: None. IMPRESSION: 1. Patchy small volume acute ischemic nonhemorrhagic areas of acute to early subacute infarction involving  the right basal ganglia and right periatrial white matter. 2. Moderate chronic small vessel ischemic disease with scattered remote infarcts as above. 3. 2.8 x 2.3 cm left paraclinoid meningioma without mass effect. Electronically Signed   By: Jeannine Boga M.D.   On: 02/14/2018 22:06   Dg Chest Port 1 View  Result Date: 02/14/2018 CLINICAL DATA:  Altered mental status. EXAM: PORTABLE CHEST 1 VIEW COMPARISON:  Jan 04, 2018 FINDINGS: The heart size and mediastinal contours are within normal limits. Both lungs are clear. The visualized skeletal structures are unremarkable. IMPRESSION: No active disease. Electronically Signed   By: Dorise Bullion III M.D   On: 02/14/2018 15:57   Mr Jodene Nam Head Wo Contrast  Result Date: 02/15/2018 CLINICAL DATA:  84 y/o  F; stroke for follow-up. EXAM: MRA HEAD WITHOUT CONTRAST TECHNIQUE: Angiographic images of the Circle of Willis were obtained using MRA technique without intravenous contrast. COMPARISON:  02/14/2018 MRI of the head. 02/15/2017 CT angiogram head FINDINGS: Internal carotid arteries:  Patent. Anterior cerebral arteries: Patent. Severe distal left A1 stenosis. Middle cerebral arteries: Patent. Stable left M2 superior division origin moderate stenosis. Anterior communicating artery: Patent. Posterior communicating arteries:  Patent. Posterior cerebral arteries:  Patent. Basilar artery:  Patent. Vertebral arteries:  Patent. No evidence of high-grade stenosis, large vessel occlusion, or aneurysm unless noted above. IMPRESSION: 1. No large vessel occlusion or aneurysm. 2. Stable severe distal left A1 stenosis and moderate left M2 superior division stenosis. Electronically Signed   By: Kristine Garbe M.D.   On: 02/15/2018 01:50    Micro Results     Recent Results (from the past 240 hour(s))  Culture, Urine     Status: None (Preliminary result)   Collection Time: 02/14/18  4:24 PM  Result Value Ref Range Status   Specimen Description URINE, CLEAN  CATCH  Final   Special Requests   Final    NONE Performed at [facility], [place] [address], [place], [place] [address]    Culture PENDING  Incomplete   Report Status PENDING  Incomplete    Today   Subjective    Shelby Jennings today has no headache,no chest abdominal pain,no new weakness tingling or numbness, feels much better..     Objective   Blood pressure 135/60, pulse 72, temperature 97.7 F (36.5 C), temperature source Oral, resp. rate (!) 27, height 5\' 4"  (1.626 m), weight 62.4 kg (137 lb 9.1 oz), SpO2 100 %.   Intake/Output Summary (Last 24 hours) at 02/16/2018 0941 Last data filed at 02/16/2018 0631 Gross per 24 hour  Intake -  Output 860 ml  Net -860 ml    Exam Awake pleasantly confused, no discernible weakness on exam is generally weak and deconditioned,   [place].AT,PERRAL Supple Neck,No JVD, No cervical lymphadenopathy appriciated.  Symmetrical Chest wall movement, Good air movement bilaterally, CTAB RRR,No Gallops,Rubs or new Murmurs, No Parasternal Heave +ve B.Sounds, Abd Soft, Non tender, No organomegaly appriciated, No rebound -guarding or rigidity. No Cyanosis, Clubbing or edema, No new Rash or bruise   Data Review   CBC w Diff:  Lab Results  Component Value Date   WBC 11.8 (H) 02/14/2018   HGB 13.9 02/14/2018   HCT 43.6 02/14/2018   PLT 345 02/14/2018   LYMPHOPCT 18 01/04/2018   MONOPCT 10 01/04/2018   EOSPCT 0 01/04/2018   BASOPCT 0 01/04/2018    CMP:  Lab Results  Component Value Date   NA 141 02/14/2018   K 3.7 02/14/2018   CL 107 02/14/2018   CO2 24 02/14/2018   BUN 14 02/14/2018   CREATININE 0.86 02/14/2018   CREATININE 0.86 05/30/2012   PROT 7.1 02/14/2018   ALBUMIN 3.2 (L) 02/14/2018   BILITOT 1.0 02/14/2018   ALKPHOS 86 02/14/2018   AST 19 02/14/2018   ALT 11 02/14/2018  . Lab Results  Component Value Date   HGBA1C 6.5 (H) 02/15/2018   Lab Results  Component Value Date   CHOL 191 02/15/2018   HDL 37 (L) 02/15/2018    LDLCALC 139 (H) 02/15/2018   LDLDIRECT 180.9 06/22/2008   TRIG 77 02/15/2018   CHOLHDL 5.2 02/15/2018     Total Time in preparing paper work, data evaluation and todays exam - 53 minutes  Lala Lund M.D on 02/16/2018 at 9:41 AM  [facility]   Office  [phone]

## 2018-02-17 DIAGNOSIS — M6281 Muscle weakness (generalized): Secondary | ICD-10-CM | POA: Diagnosis not present

## 2018-02-17 DIAGNOSIS — N390 Urinary tract infection, site not specified: Secondary | ICD-10-CM | POA: Diagnosis not present

## 2018-02-17 DIAGNOSIS — M2550 Pain in unspecified joint: Secondary | ICD-10-CM | POA: Diagnosis not present

## 2018-02-17 DIAGNOSIS — I480 Paroxysmal atrial fibrillation: Secondary | ICD-10-CM | POA: Diagnosis not present

## 2018-02-17 DIAGNOSIS — I10 Essential (primary) hypertension: Secondary | ICD-10-CM | POA: Diagnosis not present

## 2018-02-17 DIAGNOSIS — R2689 Other abnormalities of gait and mobility: Secondary | ICD-10-CM | POA: Diagnosis not present

## 2018-02-17 DIAGNOSIS — I639 Cerebral infarction, unspecified: Secondary | ICD-10-CM | POA: Diagnosis not present

## 2018-02-17 DIAGNOSIS — S32591D Other specified fracture of right pubis, subsequent encounter for fracture with routine healing: Secondary | ICD-10-CM | POA: Diagnosis not present

## 2018-02-17 DIAGNOSIS — I4891 Unspecified atrial fibrillation: Secondary | ICD-10-CM | POA: Diagnosis not present

## 2018-02-17 DIAGNOSIS — S32502D Unspecified fracture of left pubis, subsequent encounter for fracture with routine healing: Secondary | ICD-10-CM | POA: Diagnosis not present

## 2018-02-17 DIAGNOSIS — Z9181 History of falling: Secondary | ICD-10-CM | POA: Diagnosis not present

## 2018-02-17 DIAGNOSIS — E119 Type 2 diabetes mellitus without complications: Secondary | ICD-10-CM | POA: Diagnosis not present

## 2018-02-17 DIAGNOSIS — G464 Cerebellar stroke syndrome: Secondary | ICD-10-CM | POA: Diagnosis not present

## 2018-02-17 DIAGNOSIS — Z7401 Bed confinement status: Secondary | ICD-10-CM | POA: Diagnosis not present

## 2018-02-17 DIAGNOSIS — R1312 Dysphagia, oropharyngeal phase: Secondary | ICD-10-CM | POA: Diagnosis not present

## 2018-02-17 DIAGNOSIS — I69319 Unspecified symptoms and signs involving cognitive functions following cerebral infarction: Secondary | ICD-10-CM | POA: Diagnosis not present

## 2018-02-17 DIAGNOSIS — M169 Osteoarthritis of hip, unspecified: Secondary | ICD-10-CM | POA: Diagnosis not present

## 2018-02-17 DIAGNOSIS — D510 Vitamin B12 deficiency anemia due to intrinsic factor deficiency: Secondary | ICD-10-CM | POA: Diagnosis not present

## 2018-02-17 DIAGNOSIS — R488 Other symbolic dysfunctions: Secondary | ICD-10-CM | POA: Diagnosis not present

## 2018-02-17 DIAGNOSIS — M79605 Pain in left leg: Secondary | ICD-10-CM | POA: Diagnosis not present

## 2018-02-17 LAB — GLUCOSE, CAPILLARY
GLUCOSE-CAPILLARY: 162 mg/dL — AB (ref 70–99)
GLUCOSE-CAPILLARY: 106 mg/dL — AB (ref 70–99)
GLUCOSE-CAPILLARY: 128 mg/dL — AB (ref 70–99)
GLUCOSE-CAPILLARY: 138 mg/dL — AB (ref 70–99)
Glucose-Capillary: 135 mg/dL — ABNORMAL HIGH (ref 70–99)

## 2018-02-17 LAB — MRSA PCR SCREENING: MRSA BY PCR: NEGATIVE

## 2018-02-17 NOTE — Evaluation (Signed)
Clinical/Bedside Swallow Evaluation Patient Details  Name: Shelby Jennings MRN: [id number] Date of Birth: 04-09-1924  Today's Date: 02/17/2018 Time: SLP Start Time (ACUTE ONLY): 1212 SLP Stop Time (ACUTE ONLY): 1224 SLP Time Calculation (min) (ACUTE ONLY): 12 min  Past Medical History:  Past Medical History:  Diagnosis Date  . B12 deficiency 07/09/2017   B12=138  . DIABETES MELLITUS, TYPE II 06/24/2009  . HYPERLIPIDEMIA 05/09/2007  . HYPERTENSION 05/09/2007  . Morbid obesity ([place])   . OSTEOARTHRITIS 05/09/2007  . Rhabdomyolysis 01/04/2018   CPK 882  . Stroke ([place])   . Stroke of right basal ganglia ([place]) 02/14/2018  . Thyroid nodule 05/29?2019   left thyroid (2.9cm) on CT chest   Past Surgical History:  Past Surgical History:  Procedure Laterality Date  . IR PERCUTANEOUS ART THROMBECTOMY/INFUSION INTRACRANIAL INC DIAG ANGIO  02/15/2017  . IR US GUIDE VASC ACCESS RIGHT  02/15/2017  . RADIOLOGY WITH ANESTHESIA N/A 02/15/2017   Procedure: CODE STROKE;  Surgeon: Radiologist, Medication, MD;  Location: [place];  Service: Radiology;  Laterality: N/A;   HPI:  Shelby Jennings is an 84 y.o. female with past medical history of dementia in a nursing home, hypertension hyperlipidemia, prior CVA presents to the ED after complaining of right upper extremity weakness. MRI + for basal ganglia stroke. Per charting pt passed stroke swallow screen, however upon SLP review pt has history of dysphagia, aspiration of thin liquids. Had repeat OP MBS 03/13/17 with recommendation for dys 3, thin liquids with chin tuck.   Assessment / Plan / Recommendation Clinical Impression  Pt presents with functional oropharyngeal swallowing as assessed clinically.  Pt tolerated all consistencies trialed with no overt s/s of aspiration.  Pt intermittently required multiple swallows with thin liquid. Of note, pt did not utilize chin tuck with PO liquid trials.  Pt's lungs are clear per CXR on 7/5 and RN reports no difficulties with POs.   Prior to assessment, SLP called facility and attempted to ascertain baseline diet and compensatory strategies, but was unable to speak with anyone. Recommend continuing regular diet with thin liquid.  Pt would benefit from SLP follow up at next level of care. SLP Visit Diagnosis: Dysphagia, oropharyngeal phase (R13.12)    Aspiration Risk  Mild aspiration risk    Diet Recommendation Regular;Thin liquid   Liquid Administration via: Cup;Straw Medication Administration: Whole meds with puree(crushed if needed) Supervision: Patient able to self feed;Staff to assist with self feeding Compensations: Minimize environmental distractions;Slow rate;Small sips/bites Postural Changes: Seated upright at 90 degrees    Other  Recommendations Oral Care Recommendations: Oral care BID   Follow up Recommendations Skilled Nursing facility        Swallow Study   General HPI: Shelby Jennings is an 84 y.o. female with past medical history of dementia in a nursing home, hypertension hyperlipidemia, prior CVA presents to the ED after complaining of right upper extremity weakness. MRI + for basal ganglia stroke. Per charting pt passed stroke swallow screen, however upon SLP review pt has history of dysphagia, aspiration of thin liquids. Had repeat OP MBS 03/13/17 with recommendation for dys 3, thin liquids with chin tuck. Type of Study: Bedside Swallow Evaluation Previous Swallow Assessment: OP MBS August 2018 most recent Diet Prior to this Study: Regular Temperature Spikes Noted: No History of Recent Intubation: No Behavior/Cognition: Lethargic/Drowsy;Requires cueing Oral Cavity Assessment: Within Functional Limits Oral Care Completed by SLP: No Oral Cavity - Dentition: Missing dentition;Adequate natural dentition Vision: Functional for self-feeding Self-Feeding Abilities: Able to feed self  Patient Positioning: Upright in bed Baseline Vocal Quality: Low vocal intensity Volitional Cough: Weak Volitional  Swallow: Able to elicit    Oral/Motor/Sensory Function Overall Oral Motor/Sensory Function: Mild impairment Facial ROM: Within Functional Limits Facial Symmetry: Within Functional Limits Lingual ROM: Reduced right;Reduced left Lingual Strength: Reduced Velum: Within Functional Limits Mandible: Within Functional Limits   Ice Chips Ice chips: Not tested   Thin Liquid Presentation: Cup Pharyngeal  Phase Impairments: Multiple swallows    Nectar Thick Nectar Thick Liquid: Not tested   Honey Thick Honey Thick Liquid: Not tested   Puree Puree: Within functional limits Presentation: Spoon   Solid   GO   Solid: Impaired Presentation: Self Fed Oral Phase Functional Implications: Prolonged oral transit        Nomar Broad E Ceara Wrightson, MA, CCC-SLP. Pager [phone] 02/17/2018,1:14 PM

## 2018-02-17 NOTE — Progress Notes (Signed)
Discharge to: [place] Anticipated discharge date: 02/17/18 Family notified: Yes, by phone Transportation by: [facility]  Report #: [phone], Room 76  [place] signing off.  Laveda Abbe LCSW [phone]

## 2018-02-17 NOTE — Progress Notes (Signed)
Pt discharge education and instructions completed. Pt discharge to [facility] and report called off to nurse Stanton Kidney at the facility. Pt's nephew [name] [facility]) called and updated as instructed by CSW. Pt transported off unit via stretcher with belongings to the side. Pt picked up and to be transported to disposition by [facility]. Delia Heady RN

## 2018-02-18 DIAGNOSIS — M79605 Pain in left leg: Secondary | ICD-10-CM | POA: Diagnosis not present

## 2018-02-18 DIAGNOSIS — I69319 Unspecified symptoms and signs involving cognitive functions following cerebral infarction: Secondary | ICD-10-CM | POA: Diagnosis not present

## 2018-02-18 DIAGNOSIS — N390 Urinary tract infection, site not specified: Secondary | ICD-10-CM | POA: Diagnosis not present

## 2018-02-20 DIAGNOSIS — E119 Type 2 diabetes mellitus without complications: Secondary | ICD-10-CM | POA: Diagnosis not present

## 2018-02-20 DIAGNOSIS — I10 Essential (primary) hypertension: Secondary | ICD-10-CM | POA: Diagnosis not present

## 2018-02-20 DIAGNOSIS — I4891 Unspecified atrial fibrillation: Secondary | ICD-10-CM | POA: Diagnosis not present

## 2018-02-20 DIAGNOSIS — S32591D Other specified fracture of right pubis, subsequent encounter for fracture with routine healing: Secondary | ICD-10-CM | POA: Diagnosis not present

## 2018-02-24 DIAGNOSIS — S32591D Other specified fracture of right pubis, subsequent encounter for fracture with routine healing: Secondary | ICD-10-CM | POA: Diagnosis not present

## 2018-02-24 DIAGNOSIS — I4891 Unspecified atrial fibrillation: Secondary | ICD-10-CM | POA: Diagnosis not present

## 2018-02-24 DIAGNOSIS — E119 Type 2 diabetes mellitus without complications: Secondary | ICD-10-CM | POA: Diagnosis not present

## 2018-02-26 DIAGNOSIS — R2681 Unsteadiness on feet: Secondary | ICD-10-CM | POA: Diagnosis not present

## 2018-02-26 DIAGNOSIS — M6281 Muscle weakness (generalized): Secondary | ICD-10-CM | POA: Diagnosis not present

## 2018-02-26 DIAGNOSIS — I4891 Unspecified atrial fibrillation: Secondary | ICD-10-CM | POA: Diagnosis not present

## 2018-02-26 DIAGNOSIS — I639 Cerebral infarction, unspecified: Secondary | ICD-10-CM | POA: Diagnosis not present

## 2018-02-27 DIAGNOSIS — I4891 Unspecified atrial fibrillation: Secondary | ICD-10-CM | POA: Diagnosis not present

## 2018-02-27 DIAGNOSIS — M6281 Muscle weakness (generalized): Secondary | ICD-10-CM | POA: Diagnosis not present

## 2018-02-27 DIAGNOSIS — R2681 Unsteadiness on feet: Secondary | ICD-10-CM | POA: Diagnosis not present

## 2018-02-28 DIAGNOSIS — R2681 Unsteadiness on feet: Secondary | ICD-10-CM | POA: Diagnosis not present

## 2018-02-28 DIAGNOSIS — I4891 Unspecified atrial fibrillation: Secondary | ICD-10-CM | POA: Diagnosis not present

## 2018-02-28 DIAGNOSIS — M6281 Muscle weakness (generalized): Secondary | ICD-10-CM | POA: Diagnosis not present

## 2018-03-03 DIAGNOSIS — R2681 Unsteadiness on feet: Secondary | ICD-10-CM | POA: Diagnosis not present

## 2018-03-03 DIAGNOSIS — I4891 Unspecified atrial fibrillation: Secondary | ICD-10-CM | POA: Diagnosis not present

## 2018-03-03 DIAGNOSIS — M6281 Muscle weakness (generalized): Secondary | ICD-10-CM | POA: Diagnosis not present

## 2018-03-04 DIAGNOSIS — I639 Cerebral infarction, unspecified: Secondary | ICD-10-CM | POA: Diagnosis not present

## 2018-03-04 DIAGNOSIS — R2681 Unsteadiness on feet: Secondary | ICD-10-CM | POA: Diagnosis not present

## 2018-03-04 DIAGNOSIS — I4891 Unspecified atrial fibrillation: Secondary | ICD-10-CM | POA: Diagnosis not present

## 2018-03-04 DIAGNOSIS — M6281 Muscle weakness (generalized): Secondary | ICD-10-CM | POA: Diagnosis not present

## 2018-03-04 DIAGNOSIS — I10 Essential (primary) hypertension: Secondary | ICD-10-CM | POA: Diagnosis not present

## 2018-03-04 DIAGNOSIS — E119 Type 2 diabetes mellitus without complications: Secondary | ICD-10-CM | POA: Diagnosis not present

## 2018-03-05 DIAGNOSIS — D519 Vitamin B12 deficiency anemia, unspecified: Secondary | ICD-10-CM | POA: Diagnosis not present

## 2018-03-05 DIAGNOSIS — E0800 Diabetes mellitus due to underlying condition with hyperosmolarity without nonketotic hyperglycemic-hyperosmolar coma (NKHHC): Secondary | ICD-10-CM | POA: Diagnosis not present

## 2018-03-05 DIAGNOSIS — M6281 Muscle weakness (generalized): Secondary | ICD-10-CM | POA: Diagnosis not present

## 2018-03-05 DIAGNOSIS — I4891 Unspecified atrial fibrillation: Secondary | ICD-10-CM | POA: Diagnosis not present

## 2018-03-05 DIAGNOSIS — R2681 Unsteadiness on feet: Secondary | ICD-10-CM | POA: Diagnosis not present

## 2018-03-06 DIAGNOSIS — I4891 Unspecified atrial fibrillation: Secondary | ICD-10-CM | POA: Diagnosis not present

## 2018-03-06 DIAGNOSIS — R2681 Unsteadiness on feet: Secondary | ICD-10-CM | POA: Diagnosis not present

## 2018-03-06 DIAGNOSIS — M6281 Muscle weakness (generalized): Secondary | ICD-10-CM | POA: Diagnosis not present

## 2018-03-07 ENCOUNTER — Ambulatory Visit: Payer: Medicare Other | Admitting: Neurology

## 2018-03-07 DIAGNOSIS — R2681 Unsteadiness on feet: Secondary | ICD-10-CM | POA: Diagnosis not present

## 2018-03-07 DIAGNOSIS — M6281 Muscle weakness (generalized): Secondary | ICD-10-CM | POA: Diagnosis not present

## 2018-03-07 DIAGNOSIS — I4891 Unspecified atrial fibrillation: Secondary | ICD-10-CM | POA: Diagnosis not present

## 2018-03-09 DIAGNOSIS — R2681 Unsteadiness on feet: Secondary | ICD-10-CM | POA: Diagnosis not present

## 2018-03-09 DIAGNOSIS — I4891 Unspecified atrial fibrillation: Secondary | ICD-10-CM | POA: Diagnosis not present

## 2018-03-09 DIAGNOSIS — M6281 Muscle weakness (generalized): Secondary | ICD-10-CM | POA: Diagnosis not present

## 2018-03-10 DIAGNOSIS — R2681 Unsteadiness on feet: Secondary | ICD-10-CM | POA: Diagnosis not present

## 2018-03-10 DIAGNOSIS — I4891 Unspecified atrial fibrillation: Secondary | ICD-10-CM | POA: Diagnosis not present

## 2018-03-10 DIAGNOSIS — M6281 Muscle weakness (generalized): Secondary | ICD-10-CM | POA: Diagnosis not present

## 2018-03-11 DIAGNOSIS — I4891 Unspecified atrial fibrillation: Secondary | ICD-10-CM | POA: Diagnosis not present

## 2018-03-11 DIAGNOSIS — R2681 Unsteadiness on feet: Secondary | ICD-10-CM | POA: Diagnosis not present

## 2018-03-11 DIAGNOSIS — M6281 Muscle weakness (generalized): Secondary | ICD-10-CM | POA: Diagnosis not present

## 2018-03-12 DIAGNOSIS — R2681 Unsteadiness on feet: Secondary | ICD-10-CM | POA: Diagnosis not present

## 2018-03-12 DIAGNOSIS — M6281 Muscle weakness (generalized): Secondary | ICD-10-CM | POA: Diagnosis not present

## 2018-03-12 DIAGNOSIS — I4891 Unspecified atrial fibrillation: Secondary | ICD-10-CM | POA: Diagnosis not present

## 2019-04-05 IMAGING — MR MR HEAD W/O CM
8 of 10 series · 35 of 48 positions shown · non-contrast
Comparison: CT HEAD February 15, 2017 and CTA HEAD February 15, 2017.

CLINICAL DATA: Followup stroke, status post mechanical thrombectomy
of RIGHT M1 occlusion February 15, 2017.

EXAM:
MRI HEAD WITHOUT CONTRAST
TECHNIQUE: Multiplanar, multiecho pulse sequences of the brain and surrounding
structures were obtained without intravenous contrast.

[Series 4: DWI · axial · 3.0mm · 0.94mm/px · z∈[-167,-24]mm · 9 of 99 slices shown (1 of 2)]
[im 1/99]
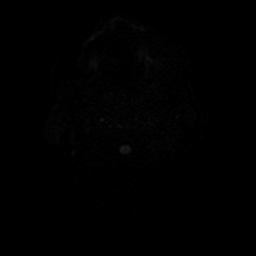
[im 13/99]
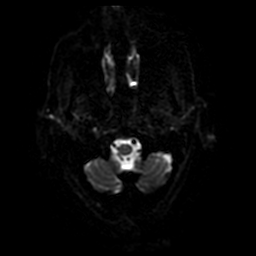
[im 25/99]
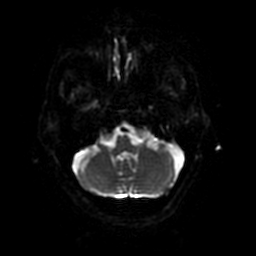
[im 37/99]
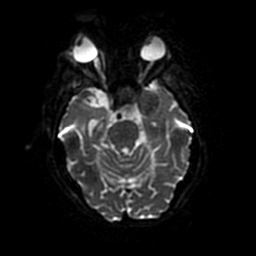
[im 50/99]
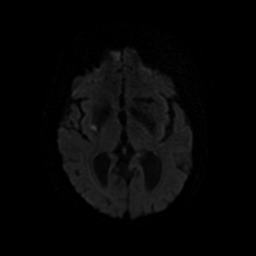
[im 62/99]
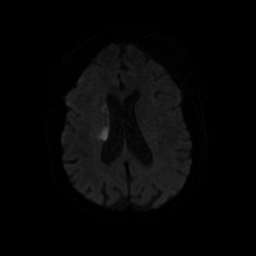
[im 74/99]
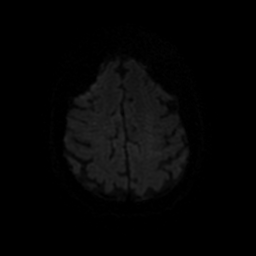
[im 86/99]
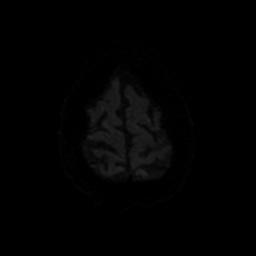
[im 99/99]
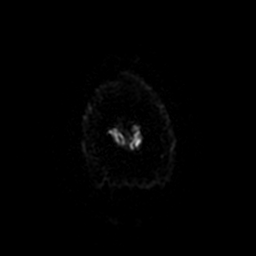

[Series 5: FLAIR · sagittal · 5.0mm · 0.47mm/px · 2 of 23 slices shown (1 of 2)]
[im 1/23]
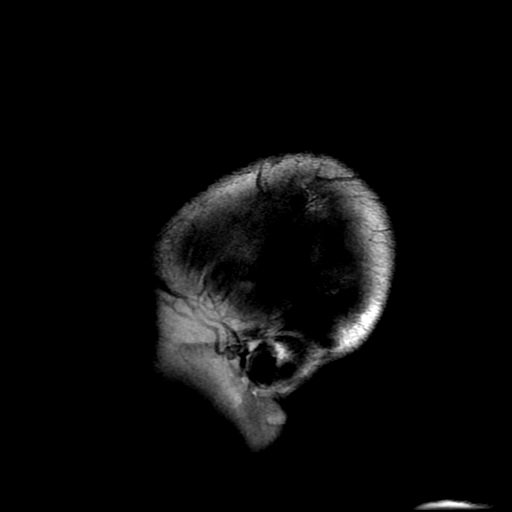
[im 23/23]
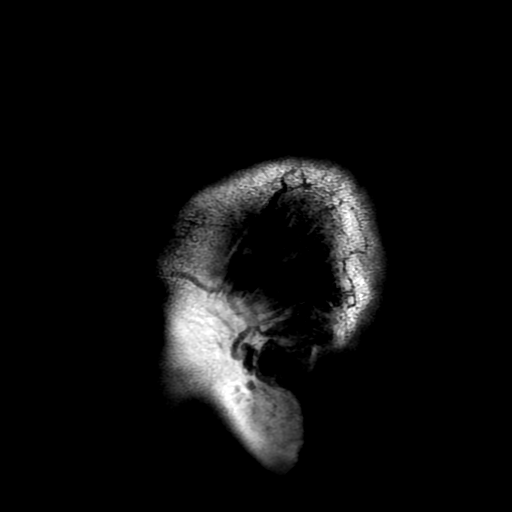

[Series 7: T2 · axial · 5.0mm · 0.47mm/px · z∈[-182,-17]mm · 3 of 29 slices shown (1 of 2)]
[im 1/29]
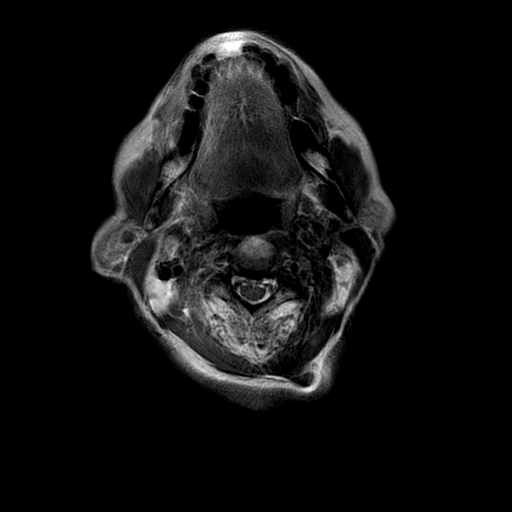
[im 15/29]
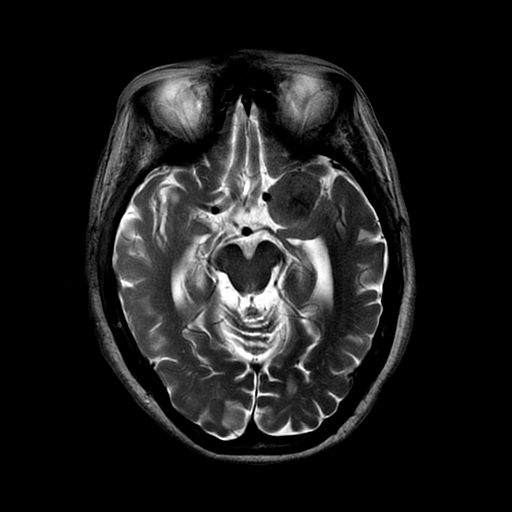
[im 29/29]
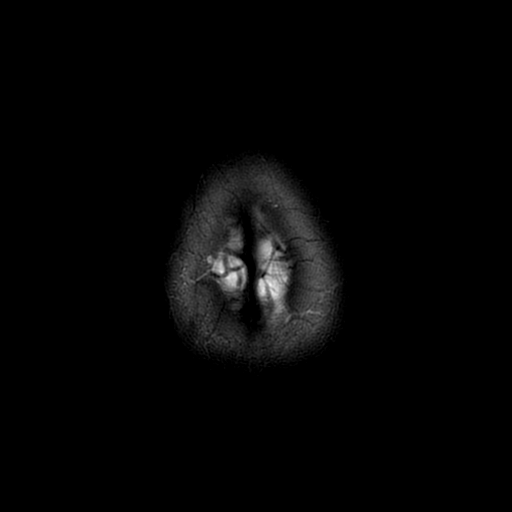

[Series 8: FLAIR · axial · 5.0mm · 0.47mm/px · z∈[-182,-17]mm · 3 of 29 slices shown (2 of 2)]
[im 1/29]
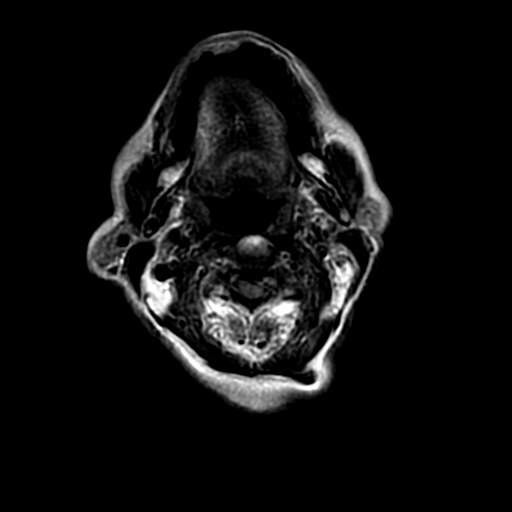
[im 15/29]
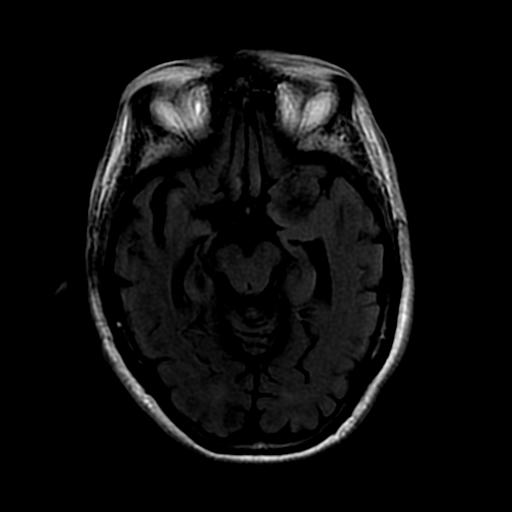
[im 29/29]
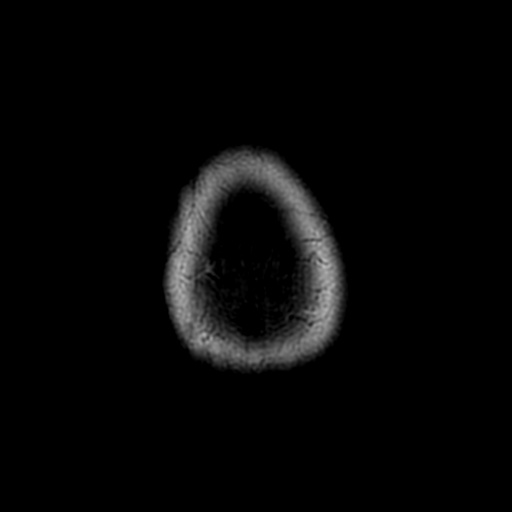

[Series 9: DWI · coronal · 4.0mm · 0.94mm/px · 7 of 72 slices shown (2 of 2)]
[im 1/72]
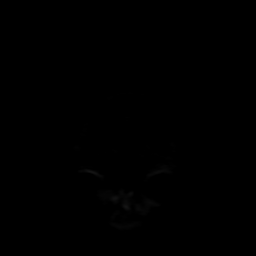
[im 12/72]
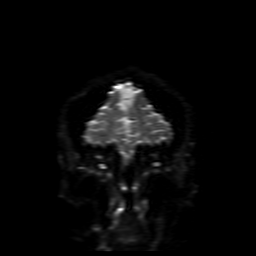
[im 24/72]
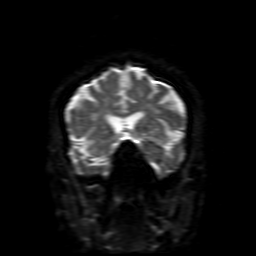
[im 36/72]
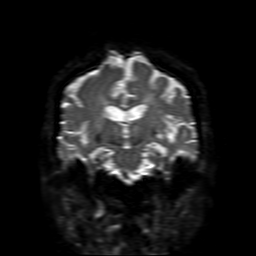
[im 48/72]
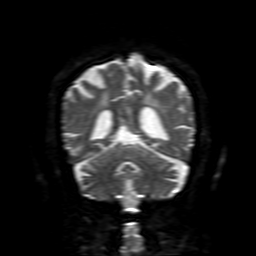
[im 60/72]
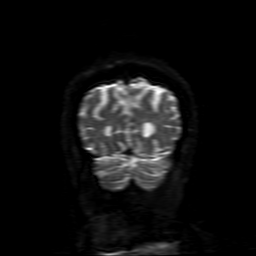
[im 72/72]
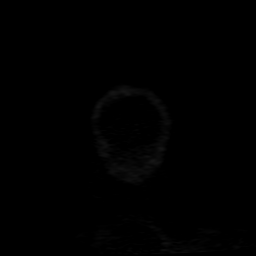

[Series 12: T2 · coronal · 5.0mm · 0.47mm/px · 3 of 27 slices shown (2 of 2)]
[im 1/27]
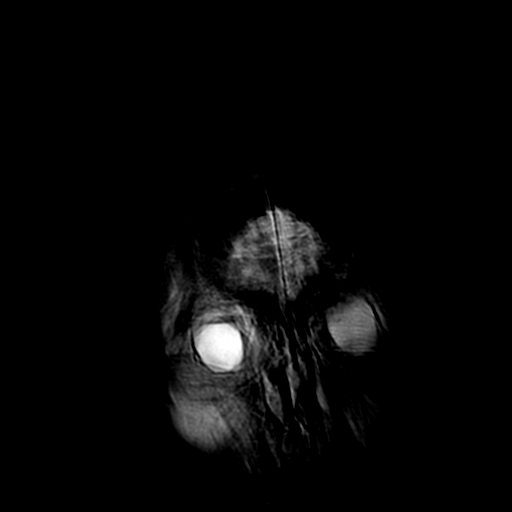
[im 14/27]
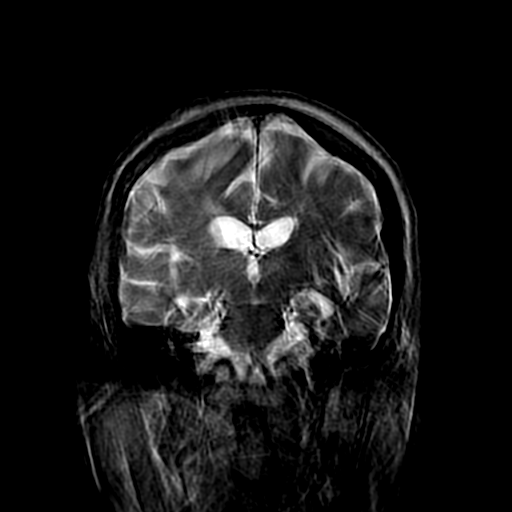
[im 27/27]
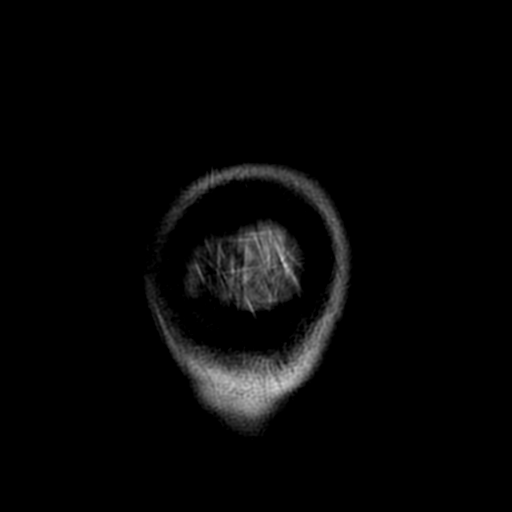

[Series 450: ADC · axial · 3.0mm · 0.94mm/px · z∈[-167,-24]mm · 5 of 50 slices shown (1 of 2)]
[im 1/50]
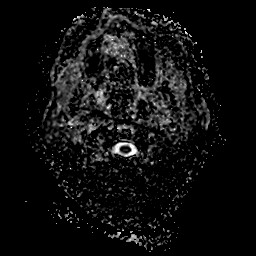
[im 13/50]
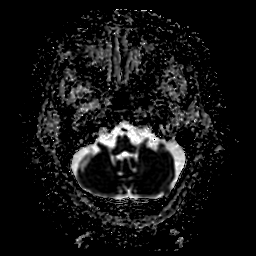
[im 25/50]
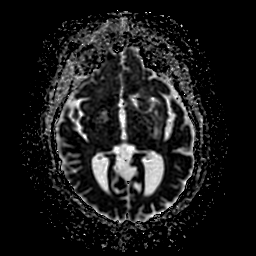
[im 37/50]
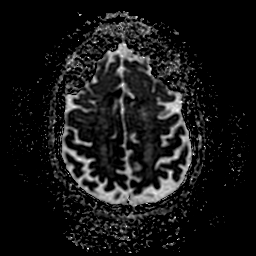
[im 50/50]
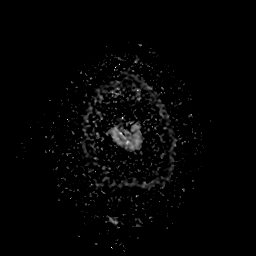

[Series 950: ADC · coronal · 4.0mm · 0.94mm/px · 3 of 36 slices shown (2 of 2)]
[im 1/36]
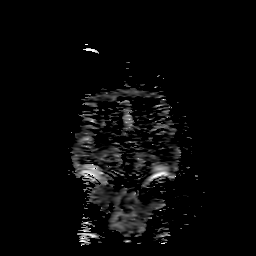
[im 18/36]
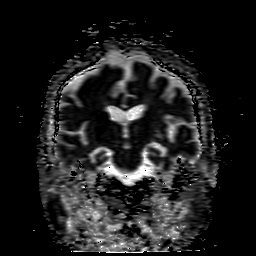
[im 36/36]
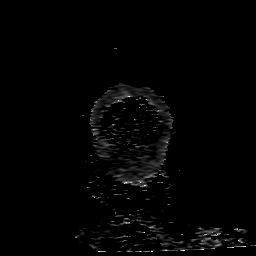

[35 of 48 positions shown; findings below may reference images not displayed]

FINDINGS: Mild to moderately motion degraded examination.

BRAIN: Numerous areas of reduced diffusion within RIGHT temporal
lobe, RIGHT occipital lobe, RIGHT frontal lobe, largest within RIGHT
basal ganglia measuring 13 x 7 mm. Areas demonstrate normalized to
mildly decreased ADC values. No susceptibility artifact to suggest
hemorrhagic conversion though MARLOW sequences mildly motion degraded.
Ventricles and sulci are normal for patient's age. Old LEFT basal
ganglia lacunar infarct. Patchy to confluent supratentorial white
matter T2 hyperintensities. Old small RIGHT cerebellar infarcts. No
abnormal extra-axial fluid collections. Low signal 2.8 x 2.1 cm mass
contiguous with the LEFT anterior clinoid.

VASCULAR: Normal major intracranial vascular flow voids present at
skull base.

SKULL AND UPPER CERVICAL SPINE: No abnormal sellar expansion. No
suspicious calvarial bone marrow signal. Craniocervical junction
maintained.

SINUSES/ORBITS: The mastoid air-cells and included paranasal sinuses
are well-aerated. The included ocular globes and orbital contents
are non-suspicious.

OTHER: None.
IMPRESSION: 1. Motion degraded examination. Acute to possibly subacute
multifocal small RIGHT MCA territory infarcts without hemorrhagic
conversion.
2. Moderate chronic small vessel ischemic disease. Old LEFT basal
ganglia and RIGHT cerebellar small infarcts.
3. 2.8 x 2.1 cm LEFT paraclinoid meningioma.

## 2020-02-11 DEATH — deceased
